# Patient Record
Sex: Female | Born: 1935 | Race: Black or African American | Hispanic: No | Smoking: Former smoker
Health system: Southern US, Community
[De-identification: ages and names within clinical notes are randomized; demographics above are authoritative.]

## PROBLEM LIST (undated history)

## (undated) DIAGNOSIS — M8580 Other specified disorders of bone density and structure, unspecified site: Secondary | ICD-10-CM

## (undated) DIAGNOSIS — F419 Anxiety disorder, unspecified: Secondary | ICD-10-CM

## (undated) DIAGNOSIS — J411 Mucopurulent chronic bronchitis: Secondary | ICD-10-CM

## (undated) DIAGNOSIS — F0150 Vascular dementia without behavioral disturbance: Secondary | ICD-10-CM

## (undated) DIAGNOSIS — C259 Malignant neoplasm of pancreas, unspecified: Secondary | ICD-10-CM

## (undated) DIAGNOSIS — M1990 Unspecified osteoarthritis, unspecified site: Secondary | ICD-10-CM

## (undated) DIAGNOSIS — J8410 Pulmonary fibrosis, unspecified: Secondary | ICD-10-CM

## (undated) DIAGNOSIS — M0600 Rheumatoid arthritis without rheumatoid factor, unspecified site: Secondary | ICD-10-CM

## (undated) DIAGNOSIS — M545 Low back pain, unspecified: Secondary | ICD-10-CM

## (undated) DIAGNOSIS — R112 Nausea with vomiting, unspecified: Secondary | ICD-10-CM

## (undated) DIAGNOSIS — Z9889 Other specified postprocedural states: Secondary | ICD-10-CM

## (undated) DIAGNOSIS — R202 Paresthesia of skin: Secondary | ICD-10-CM

## (undated) HISTORY — DX: Malignant neoplasm of pancreas, unspecified: C25.9

## (undated) HISTORY — PX: APPENDECTOMY: SHX54

## (undated) HISTORY — DX: Pulmonary fibrosis, unspecified: J84.10

## (undated) HISTORY — DX: Vascular dementia, unspecified severity, without behavioral disturbance, psychotic disturbance, mood disturbance, and anxiety: F01.50

## (undated) HISTORY — DX: Other specified disorders of bone density and structure, unspecified site: M85.80

## (undated) HISTORY — DX: Unspecified osteoarthritis, unspecified site: M19.90

## (undated) HISTORY — DX: Anxiety disorder, unspecified: F41.9

## (undated) HISTORY — DX: Paresthesia of skin: R20.2

## (undated) HISTORY — DX: Mucopurulent chronic bronchitis: J41.1

## (undated) HISTORY — DX: Rheumatoid arthritis without rheumatoid factor, unspecified site: M06.00

## (undated) HISTORY — DX: Low back pain, unspecified: M54.50

## (undated) HISTORY — DX: Low back pain: M54.5

## (undated) HISTORY — PX: ABDOMINAL HYSTERECTOMY: SHX81

---

## 1997-08-18 ENCOUNTER — Ambulatory Visit (HOSPITAL_COMMUNITY): Admission: RE | Admit: 1997-08-18 | Discharge: 1997-08-18 | Payer: Self-pay | Admitting: Pulmonary Disease

## 1998-03-01 ENCOUNTER — Encounter: Payer: Self-pay | Admitting: Emergency Medicine

## 1998-03-01 ENCOUNTER — Emergency Department (HOSPITAL_COMMUNITY): Admission: EM | Admit: 1998-03-01 | Discharge: 1998-03-01 | Payer: Self-pay | Admitting: Emergency Medicine

## 1998-03-06 ENCOUNTER — Emergency Department (HOSPITAL_COMMUNITY): Admission: EM | Admit: 1998-03-06 | Discharge: 1998-03-06 | Payer: Self-pay | Admitting: Emergency Medicine

## 1998-03-06 ENCOUNTER — Encounter: Payer: Self-pay | Admitting: Emergency Medicine

## 1998-04-27 ENCOUNTER — Encounter: Admission: RE | Admit: 1998-04-27 | Discharge: 1998-07-26 | Payer: Self-pay | Admitting: Orthopedic Surgery

## 1999-08-03 ENCOUNTER — Emergency Department (HOSPITAL_COMMUNITY): Admission: EM | Admit: 1999-08-03 | Discharge: 1999-08-03 | Payer: Self-pay

## 1999-09-15 ENCOUNTER — Encounter: Payer: Self-pay | Admitting: Emergency Medicine

## 1999-09-15 ENCOUNTER — Emergency Department (HOSPITAL_COMMUNITY): Admission: EM | Admit: 1999-09-15 | Discharge: 1999-09-15 | Payer: Self-pay | Admitting: Emergency Medicine

## 2000-04-15 ENCOUNTER — Emergency Department (HOSPITAL_COMMUNITY): Admission: EM | Admit: 2000-04-15 | Discharge: 2000-04-15 | Payer: Self-pay | Admitting: Emergency Medicine

## 2000-04-15 ENCOUNTER — Encounter: Payer: Self-pay | Admitting: Emergency Medicine

## 2000-09-26 ENCOUNTER — Emergency Department (HOSPITAL_COMMUNITY): Admission: EM | Admit: 2000-09-26 | Discharge: 2000-09-26 | Payer: Self-pay | Admitting: Emergency Medicine

## 2000-09-26 ENCOUNTER — Encounter: Payer: Self-pay | Admitting: Emergency Medicine

## 2003-06-21 ENCOUNTER — Emergency Department (HOSPITAL_COMMUNITY): Admission: EM | Admit: 2003-06-21 | Discharge: 2003-06-21 | Payer: Self-pay | Admitting: Emergency Medicine

## 2003-12-29 ENCOUNTER — Encounter: Admission: RE | Admit: 2003-12-29 | Discharge: 2003-12-29 | Payer: Self-pay | Admitting: *Deleted

## 2004-04-12 ENCOUNTER — Ambulatory Visit: Payer: Self-pay | Admitting: Pulmonary Disease

## 2004-04-22 ENCOUNTER — Encounter (HOSPITAL_COMMUNITY): Admission: RE | Admit: 2004-04-22 | Discharge: 2004-07-21 | Payer: Self-pay | Admitting: Pulmonary Disease

## 2004-05-11 ENCOUNTER — Emergency Department (HOSPITAL_COMMUNITY): Admission: EM | Admit: 2004-05-11 | Discharge: 2004-05-12 | Payer: Self-pay | Admitting: Emergency Medicine

## 2004-08-10 ENCOUNTER — Ambulatory Visit: Payer: Self-pay | Admitting: Pulmonary Disease

## 2004-08-12 ENCOUNTER — Encounter (HOSPITAL_COMMUNITY): Admission: RE | Admit: 2004-08-12 | Discharge: 2004-11-10 | Payer: Self-pay | Admitting: Pulmonary Disease

## 2004-09-25 ENCOUNTER — Encounter: Admission: RE | Admit: 2004-09-25 | Discharge: 2004-09-25 | Payer: Self-pay | Admitting: Neurology

## 2004-11-12 ENCOUNTER — Encounter (HOSPITAL_COMMUNITY): Admission: RE | Admit: 2004-11-12 | Discharge: 2005-02-10 | Payer: Self-pay | Admitting: Pulmonary Disease

## 2004-12-07 ENCOUNTER — Ambulatory Visit: Payer: Self-pay | Admitting: Pulmonary Disease

## 2005-01-05 ENCOUNTER — Ambulatory Visit: Payer: Self-pay | Admitting: Pulmonary Disease

## 2005-02-11 ENCOUNTER — Encounter (HOSPITAL_COMMUNITY): Admission: RE | Admit: 2005-02-11 | Discharge: 2005-05-11 | Payer: Self-pay | Admitting: Pulmonary Disease

## 2005-04-07 ENCOUNTER — Ambulatory Visit: Payer: Self-pay | Admitting: Pulmonary Disease

## 2005-05-12 ENCOUNTER — Encounter (HOSPITAL_COMMUNITY): Admission: RE | Admit: 2005-05-12 | Discharge: 2005-07-26 | Payer: Self-pay | Admitting: Pulmonary Disease

## 2005-06-02 ENCOUNTER — Emergency Department (HOSPITAL_COMMUNITY): Admission: EM | Admit: 2005-06-02 | Discharge: 2005-06-03 | Payer: Self-pay | Admitting: Emergency Medicine

## 2005-06-03 ENCOUNTER — Inpatient Hospital Stay (HOSPITAL_COMMUNITY): Admission: EM | Admit: 2005-06-03 | Discharge: 2005-06-07 | Payer: Self-pay | Admitting: Pulmonary Disease

## 2005-06-03 ENCOUNTER — Ambulatory Visit: Payer: Self-pay | Admitting: Internal Medicine

## 2005-06-15 ENCOUNTER — Ambulatory Visit: Payer: Self-pay | Admitting: Pulmonary Disease

## 2005-07-27 ENCOUNTER — Ambulatory Visit: Payer: Self-pay | Admitting: Pulmonary Disease

## 2005-07-28 ENCOUNTER — Encounter (HOSPITAL_COMMUNITY): Admission: RE | Admit: 2005-07-28 | Discharge: 2005-10-26 | Payer: Self-pay | Admitting: Pulmonary Disease

## 2005-09-21 ENCOUNTER — Ambulatory Visit: Payer: Self-pay | Admitting: Pulmonary Disease

## 2005-11-15 ENCOUNTER — Encounter (HOSPITAL_COMMUNITY): Admission: RE | Admit: 2005-11-15 | Discharge: 2006-02-13 | Payer: Self-pay | Admitting: Pulmonary Disease

## 2005-12-28 ENCOUNTER — Ambulatory Visit: Payer: Self-pay | Admitting: Pulmonary Disease

## 2006-04-21 ENCOUNTER — Ambulatory Visit: Payer: Self-pay | Admitting: Pulmonary Disease

## 2006-04-21 LAB — CONVERTED CEMR LAB
ALT: 13 units/L (ref 0–40)
AST: 19 units/L (ref 0–37)
Albumin: 3.5 g/dL (ref 3.5–5.2)
Alkaline Phosphatase: 67 units/L (ref 39–117)
BUN: 7 mg/dL (ref 6–23)
Basophils Absolute: 0 10*3/uL (ref 0.0–0.1)
Basophils Relative: 0.4 % (ref 0.0–1.0)
Bilirubin, Direct: 0.1 mg/dL (ref 0.0–0.3)
CO2: 30 meq/L (ref 19–32)
Calcium: 9.9 mg/dL (ref 8.4–10.5)
Chloride: 104 meq/L (ref 96–112)
Creatinine, Ser: 0.9 mg/dL (ref 0.4–1.2)
Eosinophils Absolute: 0.2 10*3/uL (ref 0.0–0.6)
Eosinophils Relative: 1.9 % (ref 0.0–5.0)
GFR calc Af Amer: 77 mL/min
GFR calc non Af Amer: 64 mL/min
Glucose, Bld: 116 mg/dL — ABNORMAL HIGH (ref 70–99)
HCT: 39.2 % (ref 36.0–46.0)
Hemoglobin: 13.4 g/dL (ref 12.0–15.0)
Lymphocytes Relative: 9.4 % — ABNORMAL LOW (ref 12.0–46.0)
MCHC: 34.2 g/dL (ref 30.0–36.0)
MCV: 90.7 fL (ref 78.0–100.0)
Monocytes Absolute: 0.3 10*3/uL (ref 0.2–0.7)
Monocytes Relative: 3.2 % (ref 3.0–11.0)
Neutro Abs: 6.7 10*3/uL (ref 1.4–7.7)
Neutrophils Relative %: 85.1 % — ABNORMAL HIGH (ref 43.0–77.0)
Platelets: 184 10*3/uL (ref 150–400)
Potassium: 3.9 meq/L (ref 3.5–5.1)
RBC: 4.32 M/uL (ref 3.87–5.11)
RDW: 13.6 % (ref 11.5–14.6)
Sed Rate: 21 mm/hr (ref 0–25)
Sodium: 144 meq/L (ref 135–145)
TSH: 1.45 microintl units/mL (ref 0.35–5.50)
Total Bilirubin: 1 mg/dL (ref 0.3–1.2)
Total Protein: 6.4 g/dL (ref 6.0–8.3)
WBC: 7.9 10*3/uL (ref 4.5–10.5)

## 2006-10-18 ENCOUNTER — Ambulatory Visit: Payer: Self-pay | Admitting: Pulmonary Disease

## 2006-12-27 ENCOUNTER — Ambulatory Visit: Payer: Self-pay | Admitting: Pulmonary Disease

## 2007-03-21 ENCOUNTER — Ambulatory Visit: Payer: Self-pay | Admitting: Pulmonary Disease

## 2007-03-21 DIAGNOSIS — J8410 Pulmonary fibrosis, unspecified: Secondary | ICD-10-CM

## 2007-03-21 DIAGNOSIS — J309 Allergic rhinitis, unspecified: Secondary | ICD-10-CM | POA: Insufficient documentation

## 2007-03-21 DIAGNOSIS — M1990 Unspecified osteoarthritis, unspecified site: Secondary | ICD-10-CM | POA: Insufficient documentation

## 2007-03-21 DIAGNOSIS — F0150 Vascular dementia without behavioral disturbance: Secondary | ICD-10-CM

## 2007-03-21 DIAGNOSIS — M545 Low back pain: Secondary | ICD-10-CM

## 2007-03-23 ENCOUNTER — Telehealth: Payer: Self-pay | Admitting: Pulmonary Disease

## 2007-03-28 DIAGNOSIS — F411 Generalized anxiety disorder: Secondary | ICD-10-CM | POA: Insufficient documentation

## 2007-03-28 DIAGNOSIS — M899 Disorder of bone, unspecified: Secondary | ICD-10-CM | POA: Insufficient documentation

## 2007-03-28 DIAGNOSIS — R209 Unspecified disturbances of skin sensation: Secondary | ICD-10-CM

## 2007-03-28 DIAGNOSIS — M949 Disorder of cartilage, unspecified: Secondary | ICD-10-CM

## 2007-03-30 ENCOUNTER — Telehealth (INDEPENDENT_AMBULATORY_CARE_PROVIDER_SITE_OTHER): Payer: Self-pay | Admitting: *Deleted

## 2007-04-24 ENCOUNTER — Ambulatory Visit: Payer: Self-pay | Admitting: Pulmonary Disease

## 2007-05-04 ENCOUNTER — Telehealth (INDEPENDENT_AMBULATORY_CARE_PROVIDER_SITE_OTHER): Payer: Self-pay | Admitting: *Deleted

## 2007-05-09 ENCOUNTER — Telehealth: Payer: Self-pay | Admitting: Pulmonary Disease

## 2007-05-16 ENCOUNTER — Telehealth (INDEPENDENT_AMBULATORY_CARE_PROVIDER_SITE_OTHER): Payer: Self-pay | Admitting: *Deleted

## 2007-06-14 ENCOUNTER — Encounter: Payer: Self-pay | Admitting: Pulmonary Disease

## 2007-06-27 ENCOUNTER — Telehealth (INDEPENDENT_AMBULATORY_CARE_PROVIDER_SITE_OTHER): Payer: Self-pay | Admitting: *Deleted

## 2007-07-25 ENCOUNTER — Telehealth: Payer: Self-pay | Admitting: Pulmonary Disease

## 2007-07-26 ENCOUNTER — Emergency Department (HOSPITAL_COMMUNITY): Admission: EM | Admit: 2007-07-26 | Discharge: 2007-07-26 | Payer: Self-pay | Admitting: Emergency Medicine

## 2007-07-27 ENCOUNTER — Telehealth: Payer: Self-pay | Admitting: Pulmonary Disease

## 2007-07-27 ENCOUNTER — Encounter: Payer: Self-pay | Admitting: Pulmonary Disease

## 2007-08-03 ENCOUNTER — Telehealth: Payer: Self-pay | Admitting: Pulmonary Disease

## 2007-09-11 ENCOUNTER — Emergency Department (HOSPITAL_COMMUNITY): Admission: EM | Admit: 2007-09-11 | Discharge: 2007-09-11 | Payer: Self-pay | Admitting: Emergency Medicine

## 2007-09-18 ENCOUNTER — Ambulatory Visit: Payer: Self-pay | Admitting: Pulmonary Disease

## 2007-09-18 DIAGNOSIS — J42 Unspecified chronic bronchitis: Secondary | ICD-10-CM

## 2007-09-20 ENCOUNTER — Encounter: Admission: RE | Admit: 2007-09-20 | Discharge: 2007-12-05 | Payer: Self-pay | Admitting: Pulmonary Disease

## 2007-09-28 ENCOUNTER — Encounter: Payer: Self-pay | Admitting: Pulmonary Disease

## 2007-10-05 ENCOUNTER — Telehealth (INDEPENDENT_AMBULATORY_CARE_PROVIDER_SITE_OTHER): Payer: Self-pay | Admitting: *Deleted

## 2007-10-17 ENCOUNTER — Telehealth: Payer: Self-pay | Admitting: Pulmonary Disease

## 2007-10-19 ENCOUNTER — Telehealth (INDEPENDENT_AMBULATORY_CARE_PROVIDER_SITE_OTHER): Payer: Self-pay | Admitting: *Deleted

## 2007-10-19 ENCOUNTER — Encounter: Payer: Self-pay | Admitting: Pulmonary Disease

## 2007-11-15 ENCOUNTER — Encounter: Payer: Self-pay | Admitting: Pulmonary Disease

## 2008-01-02 ENCOUNTER — Ambulatory Visit: Payer: Self-pay | Admitting: Pulmonary Disease

## 2008-01-22 ENCOUNTER — Ambulatory Visit: Payer: Self-pay | Admitting: Pulmonary Disease

## 2008-01-22 ENCOUNTER — Ambulatory Visit: Payer: Self-pay | Admitting: Internal Medicine

## 2008-01-27 LAB — CONVERTED CEMR LAB
ALT: 23 units/L (ref 0–35)
Alkaline Phosphatase: 88 units/L (ref 39–117)
Basophils Absolute: 0 10*3/uL (ref 0.0–0.1)
Bilirubin, Direct: 0.1 mg/dL (ref 0.0–0.3)
CO2: 28 meq/L (ref 19–32)
Calcium: 9.7 mg/dL (ref 8.4–10.5)
GFR calc Af Amer: 77 mL/min
Hemoglobin: 12.8 g/dL (ref 12.0–15.0)
Lymphocytes Relative: 17.8 % (ref 12.0–46.0)
MCHC: 34.3 g/dL (ref 30.0–36.0)
Neutro Abs: 4 10*3/uL (ref 1.4–7.7)
Platelets: 162 10*3/uL (ref 150–400)
Potassium: 4.2 meq/L (ref 3.5–5.1)
Pro B Natriuretic peptide (BNP): 88 pg/mL (ref 0.0–100.0)
RDW: 13.3 % (ref 11.5–14.6)
Saturation Ratios: 31 % (ref 20.0–50.0)
Sodium: 141 meq/L (ref 135–145)
TSH: 2.06 microintl units/mL (ref 0.35–5.50)
Total Bilirubin: 0.6 mg/dL (ref 0.3–1.2)
Transferrin: 168.3 mg/dL — ABNORMAL LOW (ref >=212.0)

## 2008-01-29 ENCOUNTER — Telehealth: Payer: Self-pay | Admitting: Pulmonary Disease

## 2008-01-31 ENCOUNTER — Telehealth (INDEPENDENT_AMBULATORY_CARE_PROVIDER_SITE_OTHER): Payer: Self-pay | Admitting: *Deleted

## 2008-02-13 ENCOUNTER — Telehealth (INDEPENDENT_AMBULATORY_CARE_PROVIDER_SITE_OTHER): Payer: Self-pay | Admitting: *Deleted

## 2008-02-22 ENCOUNTER — Telehealth: Payer: Self-pay | Admitting: Pulmonary Disease

## 2008-02-29 ENCOUNTER — Encounter: Payer: Self-pay | Admitting: Pulmonary Disease

## 2008-02-29 ENCOUNTER — Telehealth: Payer: Self-pay | Admitting: Pulmonary Disease

## 2008-03-17 ENCOUNTER — Encounter: Payer: Self-pay | Admitting: Pulmonary Disease

## 2008-04-30 ENCOUNTER — Telehealth (INDEPENDENT_AMBULATORY_CARE_PROVIDER_SITE_OTHER): Payer: Self-pay | Admitting: *Deleted

## 2008-04-30 ENCOUNTER — Encounter: Payer: Self-pay | Admitting: Pulmonary Disease

## 2008-05-01 ENCOUNTER — Telehealth: Payer: Self-pay | Admitting: Pulmonary Disease

## 2008-05-15 ENCOUNTER — Encounter: Payer: Self-pay | Admitting: Pulmonary Disease

## 2008-05-20 ENCOUNTER — Telehealth (INDEPENDENT_AMBULATORY_CARE_PROVIDER_SITE_OTHER): Payer: Self-pay | Admitting: *Deleted

## 2008-05-20 ENCOUNTER — Encounter: Payer: Self-pay | Admitting: Pulmonary Disease

## 2008-05-22 ENCOUNTER — Ambulatory Visit: Payer: Self-pay | Admitting: Pulmonary Disease

## 2008-05-22 DIAGNOSIS — M069 Rheumatoid arthritis, unspecified: Secondary | ICD-10-CM | POA: Insufficient documentation

## 2008-06-04 ENCOUNTER — Telehealth (INDEPENDENT_AMBULATORY_CARE_PROVIDER_SITE_OTHER): Payer: Self-pay | Admitting: *Deleted

## 2008-06-12 ENCOUNTER — Telehealth (INDEPENDENT_AMBULATORY_CARE_PROVIDER_SITE_OTHER): Payer: Self-pay | Admitting: *Deleted

## 2008-06-18 ENCOUNTER — Encounter: Payer: Self-pay | Admitting: Pulmonary Disease

## 2008-07-07 ENCOUNTER — Telehealth (INDEPENDENT_AMBULATORY_CARE_PROVIDER_SITE_OTHER): Payer: Self-pay | Admitting: *Deleted

## 2008-07-09 ENCOUNTER — Telehealth (INDEPENDENT_AMBULATORY_CARE_PROVIDER_SITE_OTHER): Payer: Self-pay | Admitting: *Deleted

## 2008-07-11 ENCOUNTER — Telehealth: Payer: Self-pay | Admitting: Pulmonary Disease

## 2008-07-14 ENCOUNTER — Emergency Department (HOSPITAL_COMMUNITY): Admission: EM | Admit: 2008-07-14 | Discharge: 2008-07-14 | Payer: Self-pay | Admitting: Emergency Medicine

## 2008-07-14 ENCOUNTER — Telehealth (INDEPENDENT_AMBULATORY_CARE_PROVIDER_SITE_OTHER): Payer: Self-pay | Admitting: *Deleted

## 2008-07-15 ENCOUNTER — Telehealth (INDEPENDENT_AMBULATORY_CARE_PROVIDER_SITE_OTHER): Payer: Self-pay | Admitting: *Deleted

## 2008-07-17 ENCOUNTER — Ambulatory Visit: Payer: Self-pay | Admitting: Pulmonary Disease

## 2008-07-23 ENCOUNTER — Encounter: Payer: Self-pay | Admitting: Pulmonary Disease

## 2008-07-28 ENCOUNTER — Encounter: Payer: Self-pay | Admitting: Pulmonary Disease

## 2008-08-01 ENCOUNTER — Ambulatory Visit: Payer: Self-pay | Admitting: Pulmonary Disease

## 2008-08-05 ENCOUNTER — Telehealth (INDEPENDENT_AMBULATORY_CARE_PROVIDER_SITE_OTHER): Payer: Self-pay | Admitting: *Deleted

## 2008-08-06 ENCOUNTER — Encounter: Payer: Self-pay | Admitting: Pulmonary Disease

## 2008-08-06 ENCOUNTER — Telehealth (INDEPENDENT_AMBULATORY_CARE_PROVIDER_SITE_OTHER): Payer: Self-pay | Admitting: *Deleted

## 2008-08-07 ENCOUNTER — Telehealth: Payer: Self-pay | Admitting: Pulmonary Disease

## 2008-08-10 LAB — CONVERTED CEMR LAB
ALT: 24 units/L (ref 0–35)
AST: 24 units/L (ref 0–37)
BUN: 14 mg/dL (ref 6–23)
Bilirubin, Direct: 0.2 mg/dL (ref 0.0–0.3)
Calcium: 9 mg/dL (ref 8.4–10.5)
Creatinine, Ser: 0.9 mg/dL (ref 0.4–1.2)
Eosinophils Relative: 0.9 % (ref 0.0–5.0)
GFR calc non Af Amer: 76.98 mL/min (ref >60)
Lymphocytes Relative: 11.4 % — ABNORMAL LOW (ref 12.0–46.0)
Monocytes Absolute: 0.5 10*3/uL (ref 0.1–1.0)
Monocytes Relative: 6.5 % (ref 3.0–12.0)
Neutrophils Relative %: 81 % — ABNORMAL HIGH (ref 43.0–77.0)
Platelets: 153 10*3/uL (ref 150.0–400.0)
Total Bilirubin: 0.6 mg/dL (ref 0.3–1.2)
Uric Acid, Serum: 4.3 mg/dL (ref 2.4–7.0)
WBC: 7.3 10*3/uL (ref 4.5–10.5)

## 2008-08-14 ENCOUNTER — Encounter: Payer: Self-pay | Admitting: Pulmonary Disease

## 2008-08-28 ENCOUNTER — Encounter: Payer: Self-pay | Admitting: Pulmonary Disease

## 2008-09-02 ENCOUNTER — Ambulatory Visit: Payer: Self-pay | Admitting: Pulmonary Disease

## 2008-09-04 ENCOUNTER — Telehealth (INDEPENDENT_AMBULATORY_CARE_PROVIDER_SITE_OTHER): Payer: Self-pay | Admitting: *Deleted

## 2008-09-08 ENCOUNTER — Ambulatory Visit: Payer: Self-pay | Admitting: Hematology and Oncology

## 2008-09-11 ENCOUNTER — Encounter: Payer: Self-pay | Admitting: Pulmonary Disease

## 2008-09-11 LAB — CBC WITH DIFFERENTIAL/PLATELET
BASO%: 0.1 % (ref 0.0–2.0)
EOS%: 0 % (ref 0.0–7.0)
MCH: 32.7 pg (ref 25.1–34.0)
MCV: 95.9 fL (ref 79.5–101.0)
MONO%: 2.8 % (ref 0.0–14.0)
RBC: 4.07 10*6/uL (ref 3.70–5.45)
RDW: 15.8 % — ABNORMAL HIGH (ref 11.2–14.5)

## 2008-09-11 LAB — MORPHOLOGY

## 2008-09-16 ENCOUNTER — Ambulatory Visit (HOSPITAL_COMMUNITY): Admission: RE | Admit: 2008-09-16 | Discharge: 2008-09-16 | Payer: Self-pay | Admitting: Hematology and Oncology

## 2008-09-16 LAB — COMPREHENSIVE METABOLIC PANEL
ALT: 17 U/L (ref 0–35)
BUN: 12 mg/dL (ref 6–23)
CO2: 28 mEq/L (ref 19–32)
Calcium: 9.5 mg/dL (ref 8.4–10.5)
Chloride: 105 mEq/L (ref 96–112)
Creatinine, Ser: 0.85 mg/dL (ref 0.40–1.20)

## 2008-09-16 LAB — KAPPA/LAMBDA LIGHT CHAINS: Lambda Free Lght Chn: 0.83 mg/dL (ref 0.57–2.63)

## 2008-09-16 LAB — SPEP & IFE WITH QIG
Alpha-1-Globulin: 5.2 % — ABNORMAL HIGH (ref 2.9–4.9)
Alpha-2-Globulin: 10.4 % (ref 7.1–11.8)
Gamma Globulin: 9.2 % — ABNORMAL LOW (ref 11.1–18.8)

## 2008-09-16 LAB — LACTATE DEHYDROGENASE: LDH: 296 U/L — ABNORMAL HIGH (ref 94–250)

## 2008-09-19 ENCOUNTER — Ambulatory Visit: Payer: Self-pay | Admitting: Pulmonary Disease

## 2008-09-19 ENCOUNTER — Telehealth: Payer: Self-pay | Admitting: Pulmonary Disease

## 2008-09-26 ENCOUNTER — Encounter (INDEPENDENT_AMBULATORY_CARE_PROVIDER_SITE_OTHER): Payer: Self-pay | Admitting: Diagnostic Radiology

## 2008-09-26 ENCOUNTER — Ambulatory Visit (HOSPITAL_COMMUNITY): Admission: RE | Admit: 2008-09-26 | Discharge: 2008-09-26 | Payer: Self-pay | Admitting: Hematology and Oncology

## 2008-10-03 ENCOUNTER — Encounter: Payer: Self-pay | Admitting: Pulmonary Disease

## 2008-10-08 ENCOUNTER — Telehealth: Payer: Self-pay | Admitting: Pulmonary Disease

## 2008-10-09 ENCOUNTER — Encounter: Payer: Self-pay | Admitting: Pulmonary Disease

## 2008-11-06 ENCOUNTER — Telehealth (INDEPENDENT_AMBULATORY_CARE_PROVIDER_SITE_OTHER): Payer: Self-pay | Admitting: *Deleted

## 2008-11-07 ENCOUNTER — Encounter: Payer: Self-pay | Admitting: Pulmonary Disease

## 2008-12-10 ENCOUNTER — Ambulatory Visit: Payer: Self-pay | Admitting: Pulmonary Disease

## 2009-01-30 ENCOUNTER — Telehealth: Payer: Self-pay | Admitting: Pulmonary Disease

## 2009-02-03 ENCOUNTER — Ambulatory Visit: Payer: Self-pay | Admitting: Hematology and Oncology

## 2009-02-03 ENCOUNTER — Telehealth (INDEPENDENT_AMBULATORY_CARE_PROVIDER_SITE_OTHER): Payer: Self-pay | Admitting: *Deleted

## 2009-02-09 ENCOUNTER — Encounter: Payer: Self-pay | Admitting: Pulmonary Disease

## 2009-02-09 LAB — CBC WITH DIFFERENTIAL/PLATELET
Basophils Absolute: 0 10*3/uL (ref 0.0–0.1)
Eosinophils Absolute: 0.1 10*3/uL (ref 0.0–0.5)
HCT: 39.7 % (ref 34.8–46.6)
HGB: 13.1 g/dL (ref 11.6–15.9)
LYMPH%: 9.3 % — ABNORMAL LOW (ref 14.0–49.7)
MCV: 99.1 fL (ref 79.5–101.0)
MONO#: 0.7 10*3/uL (ref 0.1–0.9)
NEUT#: 7.6 10*3/uL — ABNORMAL HIGH (ref 1.5–6.5)
NEUT%: 81.8 % — ABNORMAL HIGH (ref 38.4–76.8)
Platelets: 201 10*3/uL (ref 145–400)
RBC: 4 10*6/uL (ref 3.70–5.45)
WBC: 9.3 10*3/uL (ref 3.9–10.3)

## 2009-02-10 ENCOUNTER — Encounter: Payer: Self-pay | Admitting: Pulmonary Disease

## 2009-02-11 LAB — COMPREHENSIVE METABOLIC PANEL
BUN: 19 mg/dL (ref 6–23)
CO2: 29 mEq/L (ref 19–32)
Glucose, Bld: 96 mg/dL (ref 70–99)
Sodium: 142 mEq/L (ref 135–145)
Total Bilirubin: 0.4 mg/dL (ref 0.3–1.2)
Total Protein: 6.3 g/dL (ref 6.0–8.3)

## 2009-02-11 LAB — IMMUNOFIXATION ELECTROPHORESIS
IgA: 385 mg/dL — ABNORMAL HIGH (ref 68–378)
IgG (Immunoglobin G), Serum: 676 mg/dL — ABNORMAL LOW (ref 694–1618)
Total Protein, Serum Electrophoresis: 6.2 g/dL (ref 6.0–8.3)

## 2009-02-20 ENCOUNTER — Encounter: Payer: Self-pay | Admitting: Pulmonary Disease

## 2009-03-02 ENCOUNTER — Telehealth (INDEPENDENT_AMBULATORY_CARE_PROVIDER_SITE_OTHER): Payer: Self-pay | Admitting: *Deleted

## 2009-04-03 ENCOUNTER — Encounter: Payer: Self-pay | Admitting: Pulmonary Disease

## 2009-04-07 ENCOUNTER — Ambulatory Visit: Payer: Self-pay | Admitting: Pulmonary Disease

## 2009-04-07 DIAGNOSIS — C9000 Multiple myeloma not having achieved remission: Secondary | ICD-10-CM | POA: Insufficient documentation

## 2009-04-17 ENCOUNTER — Telehealth: Payer: Self-pay | Admitting: Pulmonary Disease

## 2009-04-28 ENCOUNTER — Telehealth (INDEPENDENT_AMBULATORY_CARE_PROVIDER_SITE_OTHER): Payer: Self-pay | Admitting: *Deleted

## 2009-06-29 ENCOUNTER — Telehealth (INDEPENDENT_AMBULATORY_CARE_PROVIDER_SITE_OTHER): Payer: Self-pay | Admitting: *Deleted

## 2009-07-16 ENCOUNTER — Telehealth (INDEPENDENT_AMBULATORY_CARE_PROVIDER_SITE_OTHER): Payer: Self-pay | Admitting: *Deleted

## 2009-07-17 ENCOUNTER — Ambulatory Visit: Payer: Self-pay | Admitting: Hematology and Oncology

## 2009-07-20 LAB — CBC WITH DIFFERENTIAL/PLATELET
BASO%: 0.3 % (ref 0.0–2.0)
Eosinophils Absolute: 0.1 10*3/uL (ref 0.0–0.5)
LYMPH%: 21.1 % (ref 14.0–49.7)
MCHC: 32.9 g/dL (ref 31.5–36.0)
MONO#: 0.5 10*3/uL (ref 0.1–0.9)
NEUT#: 3.4 10*3/uL (ref 1.5–6.5)
Platelets: 199 10*3/uL (ref 145–400)
RBC: 3.94 10*6/uL (ref 3.70–5.45)
WBC: 5.2 10*3/uL (ref 3.9–10.3)
lymph#: 1.1 10*3/uL (ref 0.9–3.3)

## 2009-07-22 ENCOUNTER — Encounter: Payer: Self-pay | Admitting: Pulmonary Disease

## 2009-07-22 LAB — COMPREHENSIVE METABOLIC PANEL
ALT: 11 U/L (ref 0–35)
Albumin: 3.9 g/dL (ref 3.5–5.2)
CO2: 24 mEq/L (ref 19–32)
Calcium: 9.4 mg/dL (ref 8.4–10.5)
Chloride: 105 mEq/L (ref 96–112)
Glucose, Bld: 88 mg/dL (ref 70–99)
Potassium: 3.9 mEq/L (ref 3.5–5.3)
Sodium: 140 mEq/L (ref 135–145)
Total Protein: 6.4 g/dL (ref 6.0–8.3)

## 2009-07-22 LAB — SPEP & IFE WITH QIG
Albumin ELP: 60 % (ref 55.8–66.1)
Alpha-1-Globulin: 5 % — ABNORMAL HIGH (ref 2.9–4.9)
Alpha-2-Globulin: 9.8 % (ref 7.1–11.8)
Beta 2: 11 % — ABNORMAL HIGH (ref 3.2–6.5)
Beta Globulin: 5.3 % (ref 4.7–7.2)
IgA: 485 mg/dL — ABNORMAL HIGH (ref 68–378)
Total Protein, Serum Electrophoresis: 6.4 g/dL (ref 6.0–8.3)

## 2009-07-22 LAB — KAPPA/LAMBDA LIGHT CHAINS
Kappa free light chain: <0.54 mg/dL (ref 0.33–1.94)
Lambda Free Lght Chn: 0.58 mg/dL (ref 0.57–2.63)

## 2009-08-18 ENCOUNTER — Encounter: Payer: Self-pay | Admitting: Pulmonary Disease

## 2009-08-18 ENCOUNTER — Ambulatory Visit: Payer: Self-pay | Admitting: Hematology and Oncology

## 2009-09-17 ENCOUNTER — Ambulatory Visit: Payer: Self-pay | Admitting: Pulmonary Disease

## 2009-10-22 ENCOUNTER — Encounter: Payer: Self-pay | Admitting: Pulmonary Disease

## 2009-12-03 ENCOUNTER — Telehealth (INDEPENDENT_AMBULATORY_CARE_PROVIDER_SITE_OTHER): Payer: Self-pay | Admitting: *Deleted

## 2009-12-04 ENCOUNTER — Ambulatory Visit: Payer: Self-pay | Admitting: Pulmonary Disease

## 2009-12-04 LAB — CONVERTED CEMR LAB
AST: 24 units/L (ref 0–37)
Albumin: 3.9 g/dL (ref 3.5–5.2)
Alkaline Phosphatase: 73 units/L (ref 39–117)
Basophils Absolute: 0 10*3/uL (ref 0.0–0.1)
Eosinophils Absolute: 0 10*3/uL (ref 0.0–0.7)
GFR calc non Af Amer: 75.75 mL/min (ref >60)
Glucose, Bld: 102 mg/dL — ABNORMAL HIGH (ref 70–99)
HCT: 39.3 % (ref 36.0–46.0)
Lymphs Abs: 0.7 10*3/uL (ref 0.7–4.0)
MCHC: 33.4 g/dL (ref 30.0–36.0)
MCV: 97.6 fL (ref 78.0–100.0)
Monocytes Absolute: 0.3 10*3/uL (ref 0.1–1.0)
Monocytes Relative: 4.5 % (ref 3.0–12.0)
Platelets: 187 10*3/uL (ref 150.0–400.0)
Potassium: 4.8 meq/L (ref 3.5–5.1)
RDW: 14.8 % — ABNORMAL HIGH (ref 11.5–14.6)
Sodium: 141 meq/L (ref 135–145)
TSH: 1.37 microintl units/mL (ref 0.35–5.50)

## 2009-12-05 LAB — CONVERTED CEMR LAB: Vit D, 25-Hydroxy: 67 ng/mL (ref 30–89)

## 2009-12-07 ENCOUNTER — Telehealth: Payer: Self-pay | Admitting: Pulmonary Disease

## 2009-12-07 ENCOUNTER — Telehealth (INDEPENDENT_AMBULATORY_CARE_PROVIDER_SITE_OTHER): Payer: Self-pay | Admitting: *Deleted

## 2010-02-01 ENCOUNTER — Telehealth: Payer: Self-pay | Admitting: Pulmonary Disease

## 2010-02-09 ENCOUNTER — Inpatient Hospital Stay (HOSPITAL_COMMUNITY)
Admission: EM | Admit: 2010-02-09 | Discharge: 2010-02-22 | Payer: Self-pay | Source: Home / Self Care | Attending: Internal Medicine | Admitting: Internal Medicine

## 2010-02-09 ENCOUNTER — Encounter (INDEPENDENT_AMBULATORY_CARE_PROVIDER_SITE_OTHER): Payer: Self-pay | Admitting: Internal Medicine

## 2010-02-09 ENCOUNTER — Encounter: Payer: Self-pay | Admitting: Pulmonary Disease

## 2010-02-09 ENCOUNTER — Telehealth (INDEPENDENT_AMBULATORY_CARE_PROVIDER_SITE_OTHER): Payer: Self-pay | Admitting: *Deleted

## 2010-02-10 ENCOUNTER — Telehealth: Payer: Self-pay | Admitting: Pulmonary Disease

## 2010-02-11 ENCOUNTER — Telehealth: Payer: Self-pay | Admitting: Pulmonary Disease

## 2010-02-11 HISTORY — PX: OTHER SURGICAL HISTORY: SHX169

## 2010-02-17 ENCOUNTER — Ambulatory Visit: Payer: Self-pay | Admitting: Hematology and Oncology

## 2010-02-18 ENCOUNTER — Telehealth: Payer: Self-pay | Admitting: Pulmonary Disease

## 2010-02-26 ENCOUNTER — Telehealth (INDEPENDENT_AMBULATORY_CARE_PROVIDER_SITE_OTHER): Payer: Self-pay | Admitting: *Deleted

## 2010-03-01 ENCOUNTER — Telehealth (INDEPENDENT_AMBULATORY_CARE_PROVIDER_SITE_OTHER): Payer: Self-pay | Admitting: *Deleted

## 2010-03-04 ENCOUNTER — Telehealth: Payer: Self-pay | Admitting: Pulmonary Disease

## 2010-03-05 ENCOUNTER — Ambulatory Visit: Payer: Self-pay | Admitting: Pulmonary Disease

## 2010-03-05 DIAGNOSIS — C259 Malignant neoplasm of pancreas, unspecified: Secondary | ICD-10-CM | POA: Insufficient documentation

## 2010-03-10 ENCOUNTER — Ambulatory Visit: Payer: Self-pay | Admitting: Pulmonary Disease

## 2010-03-18 ENCOUNTER — Encounter: Payer: Self-pay | Admitting: Pulmonary Disease

## 2010-03-18 LAB — COMPREHENSIVE METABOLIC PANEL
ALT: 35 U/L (ref 0–35)
AST: 27 U/L (ref 0–37)
Albumin: 3.5 g/dL (ref 3.5–5.2)
Alkaline Phosphatase: 120 U/L — ABNORMAL HIGH (ref 39–117)
BUN: 15 mg/dL (ref 6–23)
CO2: 22 mEq/L (ref 19–32)
Calcium: 9.2 mg/dL (ref 8.4–10.5)
Chloride: 109 mEq/L (ref 96–112)
Creatinine, Ser: 0.75 mg/dL (ref 0.40–1.20)
Glucose, Bld: 130 mg/dL — ABNORMAL HIGH (ref 70–99)
Potassium: 4 mEq/L (ref 3.5–5.3)
Sodium: 140 mEq/L (ref 135–145)
Total Bilirubin: 1.4 mg/dL — ABNORMAL HIGH (ref 0.3–1.2)
Total Protein: 6.3 g/dL (ref 6.0–8.3)

## 2010-03-18 LAB — CBC WITH DIFFERENTIAL/PLATELET
BASO%: 0.3 % (ref 0.0–2.0)
Basophils Absolute: 0 10*3/uL (ref 0.0–0.1)
EOS%: 2.7 % (ref 0.0–7.0)
Eosinophils Absolute: 0.2 10*3/uL (ref 0.0–0.5)
HCT: 36.5 % (ref 34.8–46.6)
HGB: 12.1 g/dL (ref 11.6–15.9)
LYMPH%: 15.6 % (ref 14.0–49.7)
MCH: 30 pg (ref 25.1–34.0)
MCHC: 33.2 g/dL (ref 31.5–36.0)
MCV: 90.6 fL (ref 79.5–101.0)
MONO#: 0.5 10*3/uL (ref 0.1–0.9)
MONO%: 7.6 % (ref 0.0–14.0)
NEUT#: 4.9 10*3/uL (ref 1.5–6.5)
NEUT%: 73.8 % (ref 38.4–76.8)
Platelets: 214 10*3/uL (ref 145–400)
RBC: 4.03 10*6/uL (ref 3.70–5.45)
RDW: 14.9 % — ABNORMAL HIGH (ref 11.2–14.5)
WBC: 6.7 10*3/uL (ref 3.9–10.3)
lymph#: 1 10*3/uL (ref 0.9–3.3)
nRBC: 0 % (ref 0–0)

## 2010-03-18 LAB — CEA: CEA: 3.4 ng/mL (ref 0.0–5.0)

## 2010-03-19 ENCOUNTER — Ambulatory Visit: Payer: Self-pay | Admitting: Hematology and Oncology

## 2010-03-19 ENCOUNTER — Ambulatory Visit (HOSPITAL_COMMUNITY)
Admission: RE | Admit: 2010-03-19 | Discharge: 2010-03-19 | Payer: Self-pay | Source: Home / Self Care | Attending: Hematology and Oncology | Admitting: Hematology and Oncology

## 2010-03-22 ENCOUNTER — Telehealth: Payer: Self-pay | Admitting: Pulmonary Disease

## 2010-03-26 LAB — SPEP & IFE WITH QIG
Albumin ELP: 50.9 % — ABNORMAL LOW (ref 55.8–66.1)
Alpha-1-Globulin: 6 % — ABNORMAL HIGH (ref 2.9–4.9)
Alpha-2-Globulin: 12.3 % — ABNORMAL HIGH (ref 7.1–11.8)
Beta 2: 14.8 % — ABNORMAL HIGH (ref 3.2–6.5)
Beta Globulin: 6.8 % (ref 4.7–7.2)
Gamma Globulin: 9.2 % — ABNORMAL LOW (ref 11.1–18.8)
IgA: 448 mg/dL — ABNORMAL HIGH (ref 68–378)
IgG (Immunoglobin G), Serum: 882 mg/dL (ref 694–1618)
IgM, Serum: 34 mg/dL — ABNORMAL LOW (ref 60–263)
M-Spike, %: 0.52 g/dL
Total Protein, Serum Electrophoresis: 6.1 g/dL (ref 6.0–8.3)

## 2010-03-31 ENCOUNTER — Encounter: Payer: Self-pay | Admitting: Pulmonary Disease

## 2010-03-31 LAB — CBC WITH DIFFERENTIAL/PLATELET
BASO%: 0.8 % (ref 0.0–2.0)
Basophils Absolute: 0 10*3/uL (ref 0.0–0.1)
EOS%: 1.6 % (ref 0.0–7.0)
Eosinophils Absolute: 0.1 10*3/uL (ref 0.0–0.5)
HCT: 35 % (ref 34.8–46.6)
HGB: 11.7 g/dL (ref 11.6–15.9)
LYMPH%: 29.2 % (ref 14.0–49.7)
MCH: 29.8 pg (ref 25.1–34.0)
MCHC: 33.4 g/dL (ref 31.5–36.0)
MCV: 89.3 fL (ref 79.5–101.0)
MONO#: 0.4 10*3/uL (ref 0.1–0.9)
MONO%: 7.5 % (ref 0.0–14.0)
NEUT#: 3.1 10*3/uL (ref 1.5–6.5)
NEUT%: 60.9 % (ref 38.4–76.8)
Platelets: 171 10*3/uL (ref 145–400)
RBC: 3.92 10*6/uL (ref 3.70–5.45)
RDW: 13.6 % (ref 11.2–14.5)
WBC: 5.1 10*3/uL (ref 3.9–10.3)
lymph#: 1.5 10*3/uL (ref 0.9–3.3)

## 2010-03-31 LAB — COMPREHENSIVE METABOLIC PANEL
ALT: 48 U/L — ABNORMAL HIGH (ref 0–35)
AST: 43 U/L — ABNORMAL HIGH (ref 0–37)
Albumin: 3.5 g/dL (ref 3.5–5.2)
Alkaline Phosphatase: 115 U/L (ref 39–117)
BUN: 16 mg/dL (ref 6–23)
CO2: 19 mEq/L (ref 19–32)
Calcium: 9 mg/dL (ref 8.4–10.5)
Chloride: 105 mEq/L (ref 96–112)
Creatinine, Ser: 0.84 mg/dL (ref 0.40–1.20)
Glucose, Bld: 168 mg/dL — ABNORMAL HIGH (ref 70–99)
Potassium: 3.9 mEq/L (ref 3.5–5.3)
Sodium: 139 mEq/L (ref 135–145)
Total Bilirubin: 0.8 mg/dL (ref 0.3–1.2)
Total Protein: 6.4 g/dL (ref 6.0–8.3)

## 2010-04-07 LAB — CBC WITH DIFFERENTIAL/PLATELET
BASO%: 0.5 % (ref 0.0–2.0)
Basophils Absolute: 0 10*3/uL (ref 0.0–0.1)
EOS%: 0.7 % (ref 0.0–7.0)
HCT: 33 % — ABNORMAL LOW (ref 34.8–46.6)
HGB: 11.3 g/dL — ABNORMAL LOW (ref 11.6–15.9)
MCH: 30.4 pg (ref 25.1–34.0)
MCHC: 34.2 g/dL (ref 31.5–36.0)
MONO#: 0.3 10*3/uL (ref 0.1–0.9)
NEUT%: 60.9 % (ref 38.4–76.8)
RDW: 13.5 % (ref 11.2–14.5)
WBC: 4 10*3/uL (ref 3.9–10.3)
lymph#: 1.3 10*3/uL (ref 0.9–3.3)

## 2010-04-15 NOTE — Miscellaneous (Signed)
Summary: Plan of Care / [facility]  Plan of Care / [facility]   Imported By: Lennie Odor 10/27/2009 12:14:00  _____________________________________________________________________  External Attachment:    Type:   Image     Comment:   External Document

## 2010-04-15 NOTE — Miscellaneous (Signed)
Summary: Plan of Care & treatment/[facility]  Plan of Care & treatment/[facility]   Imported By: Sherian Rein 07/27/2009 13:11:09  _____________________________________________________________________  External Attachment:    Type:   Image     Comment:   External Document

## 2010-04-15 NOTE — Consult Note (Signed)
Summary: [place]  [place]   Imported By: Sherian Rein 02/11/2010 13:00:53  _____________________________________________________________________  External Attachment:    Type:   Image     Comment:   External Document

## 2010-04-15 NOTE — Letter (Signed)
Summary: [facility]  [facility]   Imported By: Lanelle Bal 03/19/2009 13:15:07  _____________________________________________________________________  External Attachment:    Type:   Image     Comment:   External Document

## 2010-04-15 NOTE — Assessment & Plan Note (Signed)
Summary: 6 months/apc   CC:  6 month ROV & review of mult medical problems....  History of Present Illness: 77 y/o BF here for a follow up visit... she has multiple medical problems as noted below... Followed for general medical purposes w/ hx of seroneg RA & underlying pulm fibrosis, DJD & LBP, Osteopenia, mild dementia, and Monoclonal Gammopathy/ smoldering myeloma per DrOdogwu... also followed by [place] Pellant for Rheum, and DrLucey for Ortho.   ~  September 19, 2008:  here w/ a friend who drove today, daughter in [place], visiting nurse from [place] comes once every 2 weeks and fills pill cases for her to take & hopefully monitoring for proper usage... reviewed meds (she brought bottles today)- the MTX is empty & due for refill but she is clueless- I called pharmacy... the situation is suboptimal but the best family can arrange... caution: she's taking Pred 5mg - 3/d, and MTX 2.5mg - 6tabs once per week... c/o some knee pain, but overall is improved medically.   ~  April 07, 2009:  58mo ROV & here by herself today- apparently visiting nurses still fill her pill cases for her & monitor compliance... she saw DrAnderson 11/10- tapered to 5mg  Pred & continued on 6 MTX weekly... 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 decision made for observational protocol.   ~  September 17, 2009:  she had f/u DrOdogwu 6/11- stable myeloma markers w/ Q74mo observation recommended... breathing is OK- denies cough, sputum, hemoptysis, ch in dyspnea, etc (last CXR 1/11 showed decr LVs & incr markings)... Pred 5mg  at 1.5 tab Qam, & MTX stable at 6 tabs per week... generally stable> continue same meds and follow ups.    Current Problem List:  ALLERGIC RHINITIS (ICD-477.9) - we discussed OTC meds avail to her  for her allergy symptoms- Claritin, Zyrtek, nasal saline, etc...  BRONCHITIS, RECURRENT (ICD-491.9) - no recent change in cough, no sputum, fever, etc...  PULMONARY FIBROSIS (ICD-515) - now on PREDNISONE 7.5mg /d (5mg tabs- 1.5 inAM),  NEB w/ Albut Prn,  &  GFN (she refuses Pulmicort as she says it made her sleepwalk and fall)...    ~  baseline CXR w/ incr markings, NAD...  ~  prev PFT's w/ mod restrictive dis...  s/p participation in pulm rehab program...   ~  CTChest in 1999 showed mild fibrosis, some bronchiectasis in LL's...  ~  last admitted 3/07 w/ AB exac superimposed on her underlying stable pulm fibrosis.Marland Kitchen.  ~  f/u CXR 5/10 = mild basilar reticular interstitial dis/ scarring, no change.  ~  f/u CXR 1/11 showed sl decr LVs & incr markings (?sl progression), tort Ao, etc...  DEGENERATIVE JOINT DISEASE (ICD-715.90) & BACK PAIN, LUMBAR (ICD-724.2) - w/ hx spinal stenosis and prev eval by DrWillis... daughter prev requested outpt physical therapy w/ hot pack and stimulation therapy... she takes Glucosamine/ Chondroitin daily... notes right knee pain & needs ortho eval... saw DrLucey, then DrDeveshwar- w/ Dx of seroneg RA.  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 she switched to [facility] & was changed to Pred, MTX, & Folic Acid...  ~  May10: right hand pain/ swelling worse since she stopped cortisone therapy per DrD... we re-started Medrol 8mg /d w/  improvement.  ~  labs here 5/10 showed Chem-OK, CBC-12.4, Uric-4.3, Sed-18...  ~  6/10 saw DrAnderson w/ change in regimen to PRED 5mg tabs- 3/d now, MTX 2.5mg - 6tabs/wk, Folic 1mg /d...  ~  7/11: she is currently taking Pred5mg - 1.5tabs + MTX 6tabs/wk...  OSTEOPENIA (ICD-733.90) - she takes Calcium supplement, MVI, Vit D 1000 u daily & Glucosamine...  ~  BMD in 2003 was OK w/ TScores > -1...   ~  f/u BMD 11/09 showed  TScores +1.5 in Spine, & -0.9 in right FemNeck... rec- continue calcium/ vits.  ~  Vit D level 11/09 = 33... encouraged to continue OTC Vit D supplements.  ~  labs 5/10 showed Vit D level = 39... rec> 1000 u daily.  ARTERIOSCLEROTIC DEMENTIA (ICD-290.40) - new prob per family in 2010... w/ MMSE showing 0/3 objects at 2 min after distraction... on ASA 81mg /d & ARICEPT 5mg /d started- they feel that this is helping...  ~  7/10:  reviewed med Rx & very important visitng nurse help w/ meds.  ~  7/11:  they are content to continue the Aricept at 5mg  dose.  Hx of PARESTHESIA (ICD-782.0) - prev eval by neuro in [place] in 2006 was neg...she takes MVI daily.  ANXIETY (ICD-300.00)  MULTIPLE  MYELOMA (ICD-203.00) - labs showing low IgM level & referred to Heme/Onc w/ eval DrOdogwu showing a "smoldering mult myeloma"- SPE did not show an MSpike, but IEP showed a monoclonal IgA lambda protein (light chains were neg).Marland KitchenMarland KitchenBone marrow showed hypercellular marrow w/ 20% plasma cells... decision made for observational protocol...  ~  6/11:  [facility]> myeloma markers are stable & Q62mo observation to continue.  She had a PNEUMOVAX in 2007.   Preventive Screening-Counseling & Management  Alcohol-Tobacco     Smoking Status: never  Allergies (verified): No Known Drug Allergies  Comments:  Nurse/Medical Assistant: The patient's medications and allergies were reviewed with the patient and were updated in the Medication and Allergy Lists.  Past History:  Past Medical History: ALLERGIC RHINITIS (ICD-477.9) BRONCHITIS, RECURRENT (ICD-491.9) PULMONARY FIBROSIS (ICD-515) DEGENERATIVE JOINT DISEASE (ICD-715.90) BACK PAIN, LUMBAR (ICD-724.2) SERONEGATIVE RHEUMATOID ARTHRITIS (ICD-714.0) OSTEOPENIA (ICD-733.90) ARTERIOSCLEROTIC DEMENTIA (ICD-290.40) Hx of PARESTHESIA (ICD-782.0) MULTIPLE  MYELOMA (ICD-203.00) ANXIETY (ICD-300.00)  Past Surgical  History: Appendectomy Hysterectomy  Family History: Reviewed history from 09/19/2008 and no changes required. mother deceased age 5 from lung cancer---hx of asthma father deceased age 62 from heart failure--hx of DM 1 brother deceased in his 25's from complications of DM 1 brother deceased in his 82's from complications of DM 1 sister deceased in her 35's from lung cancer  Social History: Reviewed history from 07/17/2008 and no changes required. Retired Never Smoked no alcohol   Review of Systems      See HPI       The patient complains of dyspnea on exertion and difficulty walking.  The patient denies anorexia, fever, weight loss, weight gain, vision loss, decreased hearing, hoarseness, chest pain, syncope, peripheral edema, prolonged cough, headaches, hemoptysis, abdominal pain, melena, hematochezia, severe indigestion/heartburn, hematuria, incontinence, muscle weakness, suspicious skin lesions, transient blindness, depression, unusual weight change, abnormal bleeding, enlarged lymph nodes, and angioedema.    Vital Signs:  Patient profile:   77 year old female Height:      64 inches Weight:      171 pounds BMI:     29.46 O2 Sat:      100 % on Room air Temp:     97.2 degrees F oral Pulse rate:  70 / minute BP sitting:   120 / 70  (left arm) Cuff size:   regular  Vitals Entered By: Randell Loop CMA (September 17, 2009 10:43 AM)  O2 Sat at Rest %:  100 O2 Flow:  Room air CC: 6 month ROV & review of mult medical problems... Is Patient Diabetic? No Pain Assessment Patient in pain? no      Comments no changes in meds today--pt brought in meds today   Physical Exam  Additional Exam:  WD, WN, 77 y/o BF in NAD... GENERAL:  Alert, pleasant & cooperative... HEENT:  [place]/AT, EOM-wnl, PERRLA, EACs-clear, TMs-wnl, NOSE-clear, THROAT-clear & wnl. NECK:  Supple w/ fairROM; no JVD; normal carotid impulses w/o bruits; no thyromegaly or nodules palpated; no lymphadenopathy. CHEST:   velcro rales about 1/2 way up, no wheezing, no rhonchi, no signs of consolidation... HEART:  Regular Rhythm; without murmurs/ rubs/ or gallops heard... ABDOMEN:  Soft & nontender; normal bowel sounds; no organomegaly or masses detected. EXT:  mild arthritic changes in hands; +arthritic changes in right knee & shoulder; no varicose veins, +venous insuffic, no edema. NEURO:  CN's intact; no focal neuro deficits... DERM:  No lesions noted; no rash etc...    Impression & Recommendations:  Problem # 1:  PULMONARY FIBROSIS (ICD-515) She is clinically stable>  continue current Rx...  Problem # 2:  SERONEGATIVE RHEUMATOID ARTHRITIS (ICD-714.0) Followed by [facility] on MTX & Pred... continue same.  Problem # 3:  MULTIPLE  MYELOMA (ICD-203.00) Followed by DrOdogwu on Q53mo protocol & Myeloma markers are stable....  Problem # 4:  OTHER MEDICAL PROBLEMS AS NOTED>>>  Complete Medication List: 1)  Adult Aspirin Ec Low Strength 81 Mg Tbec (Aspirin) .... Take 1 tablet by mouth once a day 2)  Mucinex 600 Mg Xr12h-tab (Guaifenesin) .... Take 1-2 tabs by mouth two times a day w/ fluids.Marland KitchenMarland Kitchen 3)  Prednisone 5 Mg Tabs (Prednisone) .... Take as directed... 4)  Methotrexate 2.5 Mg Tabs (Methotrexate sodium) .... Take as directed by dranderson (currently 6 tabs once per week) 5)  Folic Acid 1 Mg Tabs (Folic acid) .... Take 1 tab daily.Marland KitchenMarland Kitchen 6)  Centrum Silver Tabs (Multiple vitamins-minerals) .... Take 1 tablet by mouth once a day 7)  Vitamin D 1000 Unit Tabs (Cholecalciferol) .... Take 1 tablet by mouth once a day 8)  Ferrous Sulfate 325 (65 Fe) Mg Tbec (Ferrous sulfate) .... Take 1 tablet by mouth once a day 9)  Oxycodone-acetaminophen 5-325 Mg Tabs (Oxycodone-acetaminophen) .... Take one tablet by mouth every 6 hours as needed for pain 10)  Aricept 5 Mg Tabs (Donepezil hcl) .... Take 1 tab by mouth once daily...  Patient Instructions: 1)  Today we updated your med list- see below.... 2)  Continue your  current meds the same... 3)  Continue your regular follow up w/ DrAnderson for Rheumatology, and DrOdogwu for Hematology.Marland KitchenMarland Kitchen 4)  Call for any questions.Marland KitchenMarland Kitchen 5)  Please schedule a follow-up appointment in 6 months, and we will plan on rechecking your CXR at that time.Marland KitchenMarland Kitchen

## 2010-04-15 NOTE — Progress Notes (Signed)
Summary: fyi  Phone Note Call from Patient Call back at [phone]   Caller: [place] ( carolyn Call For: nadel Summary of Call: pt's b/p  is 72/54 Initial call taken by: Rickard Patience,  December 03, 2009 3:46 PM  Follow-up for Phone Call        spoke with carolyn at the health dept and she verefied pt's b/p was this low, I called and spoke with mrs Puopolo and she stated she felt fine she denies any dizziness, excessive sleepiness or any other symptoms told pt that this is a low b/p and in orer for [place] to know if this is right she would need to be seen for appt, pt unable to come in today so i made appt for 10am with sn, also advised pt if she has symptoms to call 911 or go to the closest ER--pt verbalized understanding Follow-up by: Philipp Deputy CMA,  December 03, 2009 4:14 PM

## 2010-04-15 NOTE — Progress Notes (Signed)
Summary: weak  Phone Note Call from Patient Call back at Home Phone [phone]   Caller: Daughter Cedar Sink Call For: nadel Summary of Call: pt is weak and liver level is worst.  Daughter [place] Sink called again, wants to know if she should take mom to er or does she need appt.Darletta Moll  February 01, 2010 10:56 AM  Initial call taken by: Rickard Patience,  February 01, 2010 10:54 AM  Follow-up for Phone Call        FYI - Offered pt an appt with Tammy Parrett on 02-02-10.  Pt's daughter reports that she is to weak to  come to office and she is going to take her to the ER to be evaluated. Abigail Miyamoto RN  February 01, 2010 11:31 AM   Additional Follow-up for Phone Call Additional follow up Details #1::        SN is aware of message and stated ER is appropriate. Randell Loop CMA  February 01, 2010 2:58 PM

## 2010-04-15 NOTE — Progress Notes (Signed)
Summary: meds  Phone Note Call from Patient   Caller: Daughter Call For: nadel Summary of Call: pt's daughter [place] Sink wants to speak to nurse about OTC meds. [phone] Initial call taken by: Tivis Ringer, CNA,  Jul 16, 2009 3:45 PM  Follow-up for Phone Call        Pt daughter states homehealth nurse wanted to clarify some of pt meds. Pt is supposed to take folic acid 1 mg daily, but insurance will not cover this, so they were wondering can she take OTC folic Acid ? Also pt was prescribed vitamin d 1000, but they picked up Vitamin d 2000, so the nurse wanted to know could the pt take vit d 2000 three times a week instead of vit d 1000 daily? they are unable to cut pills in half. Please advise.Carron Curie CMA  Jul 16, 2009 4:00 PM   Additional Follow-up for Phone Call Additional follow up Details #1::        per SN--called and spoke with pts daughter [place] Sink and she stated that Dr. Dareen Piano prescribed those meds for pt and she stated that both docs said the same thing---ok to take the vit d 2000units daily and otc folic acid 400mg  daily.  pt will take these as directed Additional Follow-up by: Randell Loop CMA,  Jul 16, 2009 5:05 PM

## 2010-04-15 NOTE — Progress Notes (Signed)
Summary: [place]  Phone Note Other Incoming   Caller: Shannon- Summary of Call: pt is wanting to be seen by dr Kriste Basque pt is at [facility] [address] she has been asking for you everyday Initial call taken by: Lacinda Axon,  February 11, 2010 11:14 AM  Follow-up for Phone Call        Will send to Sn as FYI.Michel Bickers [facility]  February 11, 2010 11:37 AM   SN is aware of message Randell Loop [facility]  February 11, 2010 1:55 PM

## 2010-04-15 NOTE — Progress Notes (Signed)
Summary: speak to nurse  Phone Note From Other Clinic Call back at [phone]   Caller: carolyn chaffin//[facility] Call For: nadel Summary of Call: Following up on ov from 9/23. Initial call taken by: Darletta Moll,  December 07, 2009 2:07 PM  Follow-up for Phone Call        Spoke with Eber Jones and notified that pt was doing well at 12/04/09 visit and BP was better 120/70.  No med changes were made.   Follow-up by: Vernie Murders,  December 07, 2009 2:52 PM

## 2010-04-15 NOTE — Progress Notes (Signed)
Summary: [facility] w/ [facility]  Phone Note From Other Clinic   Caller: nurse w/ [facility] Call For: nadel Summary of Call: nurse-carolyn chasen- requests to speak to dr Jodelle Green nurse at her convenience re: pt. call office # [phone] (if no answer pls call cell [phone] as nurse may be out making another pt visit.  Initial call taken by: Tivis Ringer, CNA,  February 10, 2010 4:01 PM  Follow-up for Phone Call        lmomtcb for carolyn Randell Loop [facility]  February 11, 2010 9:39 AM    carolyn called me back and stated that she is trying to get placement for pt---she stated that she is unable to care for herself and her daughter that lives in [place] is unable to care for her as well----pt is currently in the hospital at this time and carolyn is going to see pt on friday---on the phone note from 11-21 when the daughter called and wanted to know if she should take the pt to the er for eval---pts daughter ended up taking the pt to [place] for thanksgiving and had to take her to the er there and then ended up taking her out of the hospital and when they came back to [place] she took her to the ER here and thats when she got admitted.  carolyn will keep [place] posted on what all is going on with pt and if they end up placing her  Randell Loop CMA  February 11, 2010 9:58 AM

## 2010-04-15 NOTE — Assessment & Plan Note (Signed)
Summary: hfu/la   CC:  3 month ROV & post hospital follow up....  History of Present Illness: 77 y/o BF here for a follow up visit... she has multiple medical problems as noted below... Followed for general medical purposes w/ hx of seroneg RA & underlying pulm fibrosis, DJD & LBP, Osteopenia, mild dementia, and Monoclonal Gammopathy/ smoldering myeloma per DrOdogwu... also followed by [facility] for Rheum, and DrLucey for Ortho >> she was hosp 11/11 w/ jaundice & abd discomfort w/ dx of Pancreatic cancer...   ~  September 19, 2008:  here w/ a friend who drove today, daughter in [place], visiting nurse from [place] comes once every 2 weeks and fills pill cases for her to take & hopefully monitoring for proper usage... reviewed meds (she brought bottles today)- the MTX is empty & due for refill but she is clueless- I called pharmacy... the situation is suboptimal but the best family can arrange... caution: she's taking Pred 5mg - 3/d, and MTX 2.5mg - 6tabs once per week... c/o some knee pain, but overall is improved medically.   ~  April 07, 2009:  30mo ROV & here by herself today- apparently visiting nurses still fill her pill cases for her & monitor compliance... she saw DrAnderson 11/10- tapered to 5mg  Pred & continued on 6 MTX weekly... 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 decision made for observational protocol.   ~  September 17, 2009:  she had f/u DrOdogwu 6/11- stable myeloma markers w/ Q30mo observation recommended... breathing is OK- denies cough, sputum, hemoptysis, ch in dyspnea, etc (last CXR 1/11 showed decr LVs & incr markings)... Pred 5mg  at 1.5 tab Qam, & MTX stable at 6 tabs per week... generally stable> continue same meds and follow ups.   ~  December 04, 2009:   Add-on appt at request of home health nurse for low BP 72/54> pt admits to being "a little tired" but overall feels well- denies weakness, postural symptoms, dizzy, syncope, falls, etc... she is not on any vasoactive meds & doesn't have hx of HBP, no recent URI, gastroenteritis, or urinary symptoms... she has DJD & seroneg RA followed by Rheum... BP by nurse here= 118/68 & I check 120/70 w/o postural changes... we decided to check routine labs to be sure all is OK...   ~  March 05, 2010:  she was hosp 11/11 w/ abd discomfort & jaundice> diagnosed w/ pancreatic cancer w/ stent placed in common bile duct >> she was transfered to [facility] (notes from them are pending)... she has DCMed list= SEE BELOW:  Current Meds:  ADULT ASPIRIN EC LOW STRENGTH 81 MG  TBEC (ASPIRIN) Take 1 tablet by mouth once a day PREDNISONE 5 MG TABS (PREDNISONE) take as directed... PROTONIX 20 MG TBEC (PANTOPRAZOLE SODIUM) Take 1 tablet by mouth once a day ZOFRAN 4 MG TABS (ONDANSETRON HCL) as needed every 4-6 hours for nausea MIRALAX  POWD (POLYETHYLENE GLYCOL 3350) once daily GAS-X EXTRA STRENGTH 125 MG CHEW (SIMETHICONE) take one tablet by mouth every 6 hours as needed ARICEPT 5 MG TABS (DONEPEZIL HCL) take 1 tab by mouth once daily... FENTANYL 25 MCG/HR PT72 (FENTANYL) change the patch every 3d... TYLENOL 325 MG TABS (ACETAMINOPHEN) take 1-2 tabs every 4-6H as needed FOLIC ACID 1 MG TABS (FOLIC ACID) take  1 tab daily... VITAMIN D 1000 UNIT TABS (CHOLECALCIFEROL) Take 1 tablet by mouth once a day MEGESTROL ACETATE 20 MG TABS (MEGESTROL ACETATE) Take 1 tablet by mouth once a day    Current Problem List:  ALLERGIC RHINITIS (ICD-477.9) - we discussed OTC meds avail to her for her allergy symptoms- Claritin, Zyrtek, nasal saline, etc...  BRONCHITIS, RECURRENT (ICD-491.9) - no recent change in cough, no sputum, fever, etc...  PULMONARY FIBROSIS (ICD-515) - now on PREDNISONE 7.5mg /d (5mg tabs- 1.5 inAM),  NEB w/ Albut Prn,   &  GFN (she refuses Pulmicort as she says it made her sleepwalk and fall)...    ~  baseline CXR w/ incr markings, NAD...  ~  prev PFT's w/ mod restrictive dis...  s/p participation in pulm rehab program...   ~  CTChest in 1999 showed mild fibrosis, some bronchiectasis in LL's...  ~  last admitted 3/07 w/ AB exac superimposed on her underlying stable pulm fibrosis.Marland Kitchen.  ~  f/u CXR 5/10 = mild basilar reticular interstitial dis/ scarring, no change.  ~  f/u CXR 1/11 showed sl decr LVs & incr markings (?sl progression), tort Ao, etc...  ADENOCARCINOMA, PANCREAS (ICD-157.9)  ** SEE ABOVE ** new diagnosis 11/11  ~  HOSP DCSummary 02/22/10 w/ Tx to [facility] reviewed...  DEGENERATIVE JOINT DISEASE (ICD-715.90) & BACK PAIN, LUMBAR (ICD-724.2) - w/ hx spinal stenosis and prev eval by DrWillis... daughter prev requested outpt physical therapy w/ hot pack and stimulation therapy... she takes Glucosamine/ Chondroitin daily... notes right knee pain & needs ortho eval... saw DrLucey, then DrDeveshwar- w/ Dx of seroneg RA.  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 she switched to [facility] & was changed to Pred, MTX, & Folic Acid...  ~  May10: right hand pain/ swelling worse since she stopped cortisone therapy per DrD... we re-started Medrol 8mg /d w/ improvement.  ~  labs here 5/10 showed Chem-OK, CBC-12.4, Uric-4.3, Sed-18...  ~  6/10 saw DrAnderson w/ change in regimen to PRED 5mg tabs- 3/d now, MTX 2.5mg - 6tabs/wk, Folic 1mg /d...  ~  7/11: she is currently taking Pred5mg - 1.5tabs + MTX 6tabs/wk...  OSTEOPENIA (ICD-733.90) - she takes Calcium supplement, MVI, Vit D 1000 u daily & Glucosamine...  ~  BMD in 2003 was OK w/ TScores > -1...   ~  f/u BMD 11/09 showed TScores +1.5 in Spine, & -0.9 in right FemNeck... rec- continue calcium/ vits.  ~  Vit D level 11/09 = 33... encouraged to  continue OTC Vit D supplements.  ~  labs 5/10 showed Vit D level = 39... rec> 1000 u daily.  ARTERIOSCLEROTIC DEMENTIA (ICD-290.40) - new prob per family in 2010... w/ MMSE showing 0/3 objects at 2 min after distraction... on ASA 81mg /d & ARICEPT 5mg /d started- they feel that this is helping...  ~  7/10:  reviewed med Rx & very important visitng nurse help w/ meds.  ~  7/11:  they are content to continue the Aricept at 5mg  dose.  Hx of PARESTHESIA (ICD-782.0) - prev eval by neuro in [place] in 2006 was neg...she takes MVI daily.  ANXIETY (ICD-300.00)  MULTIPLE  MYELOMA (ICD-203.00) - labs showing low IgM level & referred to Heme/Onc w/ eval DrOdogwu showing a "smoldering mult myeloma"- SPE did not show an MSpike, but IEP showed a monoclonal IgA lambda protein (light chains were neg).Marland KitchenMarland KitchenBone marrow showed hypercellular marrow w/ 20% plasma cells... decision made for observational protocol...  ~  6/11:  [facility]> myeloma markers are stable & Q33mo observation to continue.  She had a PNEUMOVAX in 2007.   Preventive Screening-Counseling & Management  Alcohol-Tobacco     Smoking Status: quit     Year Quit: 1956     Pack years: 1ppdx 2 years  Allergies (verified): No Known Drug Allergies  Comments:  Nurse/Medical Assistant: The patient's medications and allergies were reviewed with the patient and were updated in the Medication and Allergy Lists.  Past History:  Past Medical History: ALLERGIC RHINITIS (ICD-477.9) BRONCHITIS, RECURRENT (ICD-491.9) PULMONARY FIBROSIS (ICD-515) ADENOCARCINOMA, PANCREAS (ICD-157.9) DEGENERATIVE JOINT DISEASE (ICD-715.90) BACK PAIN, LUMBAR (ICD-724.2) SERONEGATIVE RHEUMATOID ARTHRITIS (ICD-714.0) OSTEOPENIA (ICD-733.90) ARTERIOSCLEROTIC DEMENTIA (ICD-290.40) Hx of PARESTHESIA (ICD-782.0) MULTIPLE  MYELOMA (ICD-203.00) ANXIETY (ICD-300.00)  Past Surgical History: Appendectomy Hysterectomy Common Bile duct stent placed 12/11 for  pancreatic cancer  Family History: Reviewed history from 09/17/2009 and no changes required. mother deceased age 37 from lung cancer---hx of asthma father deceased age 71 from heart failure--hx of DM 1 brother deceased in his 56's from complications of DM 1 brother deceased in his 60's from complications of DM 1 sister deceased in her 53's from lung cancer  Social History: Reviewed history from 07/17/2008 and no changes required. Retired Never Smoked no alcohol  Smoking Status:  quit  Review of Systems      See HPI       The patient complains of anorexia, weight loss, decreased hearing, dyspnea on exertion, abdominal pain, muscle weakness, and difficulty walking.  The patient denies fever, weight gain, vision loss, hoarseness, chest pain, syncope, peripheral edema, prolonged cough, headaches, hemoptysis, melena, hematochezia, severe indigestion/heartburn, hematuria, incontinence, suspicious skin lesions, transient blindness, depression, unusual weight change, abnormal bleeding, enlarged lymph nodes, and angioedema.    Vital Signs:  Patient profile:   77 year old female Height:      64 inches Weight:      156.50 pounds BMI:     26.96 O2 Sat:      98 % on Room air Temp:     97.7 degrees F oral Pulse rate:   86 / minute BP sitting:   100 / 66  (right arm) Cuff size:   regular  Vitals Entered By: Randell Loop CMA (March 05, 2010 8:56 AM)  O2 Sat at Rest %:  98 O2 Flow:  Room air CC: 3 month ROV & post hospital follow up... Is Patient Diabetic? No Pain Assessment Patient in pain? yes      Comments meds updated today with pt and daughter   Physical Exam  Additional Exam:  WD, WN, 77 y/o BF in NAD... weak & she has obviously lost weight. GENERAL:  Alert, pleasant & cooperative... HEENT:  [place]/AT, EOM-wnl, PERRLA, EACs-clear, TMs-wnl, NOSE-clear, THROAT-clear & wnl. NECK:  Supple w/ fairROM; no JVD; normal carotid impulses w/o bruits; no thyromegaly or nodules palpated;  no lymphadenopathy. CHEST:  velcro rales about 1/2 way up, no wheezing, no rhonchi, no signs of consolidation... HEART:  Regular Rhythm; without murmurs/ rubs/ or gallops heard... ABDOMEN:  Soft & mild epig tender on palp; normal bowel sounds; no organomegaly or masses detected. EXT:  mild arthritic changes in hands; +arthritic changes in right knee & shoulder; no varicose veins, +venous insuffic, no edema. NEURO:  CN's intact; no focal neuro deficits... DERM:  No lesions noted; no rash etc...    MISC. Report  Procedure date:  03/05/2010  Findings:      DATA REVIEWED:  ~  [facility]  records & summary from [facility] 02/09/10 - 02/22/10...  ~  Records pending from [facility]...   Impression & Recommendations:  Problem # 1:  ADENOCARCINOMA, PANCREAS (ICD-157.9) Prognosis poor> they are not yet ready for hospice services... we will refer to [facility] & back to DrOdogwu for Oncology nmanagement. Orders: Oncology Referral (Oncology) DME Referral (DME)  Problem # 2:  PULMONARY FIBROSIS (ICD-515) Stable>  continue the Pred 5mg /d...  Problem # 3:  OTHER MEDICAL PROBLEMS AS NOTED>>>  Complete Medication List: 1)  Adult Aspirin Ec Low Strength 81 Mg Tbec (Aspirin) .... Take 1 tablet by mouth once a day 2)  Prednisone 5 Mg Tabs (Prednisone) .... Take as directed... 3)  Protonix 20 Mg Tbec (Pantoprazole sodium) .... Take 1 tablet by mouth once a day 4)  Zofran 4 Mg Tabs (Ondansetron hcl) .... As needed every 4-6 hours for nausea 5)  Miralax Powd (Polyethylene glycol 3350) .... Once daily 6)  Gas-x Extra Strength 125 Mg Chew (Simethicone) .... Take one tablet by mouth every 6 hours as needed 7)  Aricept 5 Mg Tabs (Donepezil hcl) .... Take 1 tab by mouth once daily.Marland KitchenMarland Kitchen 8)  Fentanyl 25 Mcg/hr Pt72 (Fentanyl) .... Change the patch every 3d... 9)  Tylenol 325 Mg Tabs (Acetaminophen) .... Take 1-2 tabs every 4-6h as needed 10)  Folic Acid 1 Mg Tabs (Folic acid) .... Take 1 tab daily... 11)  Vitamin D 1000 Unit  Tabs (Cholecalciferol) .... Take 1 tablet by mouth once a day 12)  Megestrol Acetate 20 Mg Tabs (Megestrol acetate) .... Take 1 tablet by mouth once a day  Patient Instructions: 1)  Today we updated your med list- see below.... 2)  Continue your current meds the same... 3)  We will be sure you have an appt w/ DrOdogwu for 1st week of January (as soon as she gets back in town). 4)  We will reorder the [facility] services.Marland KitchenMarland Kitchen 5)  Call if we can be of any assistance to you.Marland KitchenMarland Kitchen

## 2010-04-15 NOTE — Letter (Signed)
Summary: [facility]  [facility]   Imported By: Sherian Rein 04/06/2010 11:09:47  _____________________________________________________________________  External Attachment:    Type:   Image     Comment:   External Document

## 2010-04-15 NOTE — Progress Notes (Signed)
Summary: FYI  Phone Note Other Incoming Call back at [phone]   Caller: Shawn//[facility] [place] [facility] Summary of Call: Calling on behalf of pt, she wants Dr. Kriste Basque to come see her at [facility] rm 1407, diagnosis: pancreatic mass,probably adenocarcinoma. Ines Bloomer is calling on behalf of Dr. Gaynelle Adu ([facility]) and states she probably won't be a candidate for surgery. Initial call taken by: Darletta Moll,  February 10, 2010 9:38 AM  Follow-up for Phone Call        Called # provided above - spoke with Misty Stanley, nurse. Was told Ines Bloomer is a PA.  She will page shawn to call our office to verify message and inform SN does not see hospital pt's unless he admits them per Leigh.  Gweneth Dimitri RN  February 10, 2010 11:03 AM  Shawn returned call.  States she was only calling per pt request.  States pt was wanting to talk with Dr. Kriste Basque regarding her diagnoses for "reassurance" bc he has been her doctor for a very long time.  Advised I did not think SN sees pt in hospital any longer unless he admits them but I would get this message to him as FYI.  Shawn ok with above.    Follow-up by: Gweneth Dimitri RN,  February 10, 2010 11:08 AM  Additional Follow-up for Phone Call Additional follow up Details #1::        SN is aware of pt in hosp. Randell Loop CMA  February 10, 2010 3:01 PM

## 2010-04-15 NOTE — Progress Notes (Signed)
Summary: returned call  Phone Note Call from Patient Call back at Home Phone [phone]   Caller: Patient Call For: nadel Summary of Call: pt returned call from fri.  Initial call taken by: Tivis Ringer, CNA,  December 07, 2009 8:48 AM  Follow-up for Phone Call        Marliss Czar, did you call this pt?  I don't see anywhere in her chart where we were trying to get ahold of her.  Please advise.   Thank you.  Aundra Millet Reynolds LPN  December 07, 2009 9:00 AM   Additional Follow-up for Phone Call Additional follow up Details #1::        called and spoke with pt and she is aware of lab results per SN----all labs WNL and pt is to call for any other concerns. Randell Loop CMA  December 07, 2009 9:18 AM

## 2010-04-15 NOTE — Letter (Signed)
[facility]   Imported By: Lennie Odor 09/16/2009 16:16:08  _____________________________________________________________________  External Attachment:    Type:   Image     Comment:   External Document

## 2010-04-15 NOTE — Assessment & Plan Note (Signed)
Summary: 6 MONTHS F/U ///kp   CC:  6 month ROV & review of mult medical problems....  History of Present Illness: 77 y/o BF here for a follow up visit... she has multiple medical problems as noted below...     ~  ER visits:  1st in May09 w/ bronchitis (CXR- neg, NAD & Labs OK);  and 2nd in Jun09 after a fall- she says she was sleepwalking and fell (she blames this on the Pulmicort and refuses to use it any longer) w/ injury to left ribs & costal margin (Rib XRays were neg)...  she had physical therapy- states this was helpful w/ balance & strength...   ~  May 22, 2008:   c/o tires easily and sleeps alot... notes "cold" and aching in hands... since I saw her last in Nov09 she has been seen by [facility] for [facility], and he referred her to DrDeveshwar for Rheum- we do not have notes, but she has papers that indicate new Dx of seroneg RA & treated w/ Plaquenil & Pred... she is here by herself today- she needs help at home w/ her meds and she will discuss w/ her daughter.  ~  Jul 17, 2008:  mult problems recently & daughter very concerned- trying to handle things long dist is difficult... persistant pain, swelling, & weakness in right hand... plus weak, malaise, wt loss, appetite decr; but no N/ V or abd pain etc... DrDeveshwar ?stopped her Pred/ Medrol Rx, still on Plaquenil, & it sounds like she got worse off the cortisone therapy... they are frustrated and have set up a second opinion consult w/ DrAnderson... we decided to stop her Theo-24 & Aricept in case they were affecting her appetite, and gave her a Depo80 shot + restart Medrol at 8mg /d... f/u 2 weeks.  ~  Aug 01, 2008:  feeling some better- nausea resolved and appetite improved w/ 4# wt gain... we decided to restart the Aricept at 5mg /d, keep the Medrol 8mg /d for now.  ~  September 02, 2008:  she saw DrAnderson for Rheum eval 6/10- he changed Plaquenil to MTX 2.5mg - 6/wk + Folic Acid 1mg /d... he also changed the Medrol to Prednisone 5mg  tabs- take 4 tabs  daily, then decr to 3 tabs daily...   ** it is important to keep her regimen simple for compliance reasons...   ~  September 19, 2008:  here w/ a friend who drove today, daughter in [place], visiting nurse from [place] comes once every 2 weeks and fills pill cases for her to take & hopefully monitoring for proper usage... reviewed meds she brought bottles today- the MTX is empty & due for refill but she is clueless- I call pharmacy... the situation is suboptimal but the best family can arrange... caution: she's taking Pred 5mg - 3/d, and MTX 2.5mg - 6tabs once per week... c/o some knee pain, but overall is improved medically.   ~  April 07, 2009:  67mo ROV & here by herself today- apparently visiting nurses still fill her pill cases for her & monitor compliance... she saw DrAnderson 11/10- tapered to 5mg  Pred & continued on 6 MTX weekly... 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 decision made for observational protocol...    Current Problem List:  ALLERGIC RHINITIS (ICD-477.9) - we discussed  OTC meds avail to her for her allergy symptoms- Claritin, Zyrtek, nasal saline, etc...  BRONCHITIS, RECURRENT (ICD-491.9) - no recent change in cough, no sputum, fever, etc...  PULMONARY FIBROSIS (ICD-515) - now on PREDNISONE 15mg /d (5mg tabs- 3 inAM),  NEB w/ Albut Prn,  &  GFN ( she refuses Pulmicort as she says it made her sleepwalk and fall)...    ~  baseline CXR w/ incr markings, NAD...  f/u CXR 5/10 = mild basilar reticular interstitial dis/ scarring, no change.  ~  prev PFT's w/ mod restrictive dis...  s/p participation in pulm rehab program...   ~  CTChest in 1999 showed mild fibrosis, some bronchiectasis in LL's...  ~  last admitted 3/07 w/ AB exac superimposed on her underlying  stable pulm fibrosis...   DEGENERATIVE JOINT DISEASE (ICD-715.90) & BACK PAIN, LUMBAR (ICD-724.2) - w/ hx spinal stenosis and prev eval by DrWillis... daughter prev requested outpt physical therapy w/ hot pack and stimulation therapy... she takes Glucosamine/ Chondroitin daily... notes right knee pain & needs ortho eval... saw DrLucey, then DrDeveshwar- w/ Dx of seroneg RA.  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 she switched to [facility] & was changed to Pred, MTX, & Folic Acid...  ~  May10: right hand pain/ swelling worse since she stopped cortisone therapy per DrD... we re-started Medrol 8mg /d w/ improvement.  ~  labs here 5/10 showed Chem-OK, CBC-12.4, Uric-4.3, Sed-18...  ~  6/10 saw DrAnderson w/ change in regimen to PRED 5mg tabs- 3/d now, MTX 2.5mg - 6tabs/wk, Folic 1mg /d...  OSTEOPENIA (ICD-733.90) - she takes Calcium supplement, MVI, Vit D 1000 u daily & Glucosamine...  ~  BMD in 2003 was OK w/ TScores > -1...   ~  f/u BMD 11/09 showed TScores +1.5 in Spine, & -0.9 in right FemNeck... rec- continue calcium/ vits.  ~  Vit D level 11/09 = 33... encouraged to continue OTC Vit D supplements.  ~  labs 5/10 showed Vit D level = 39... rec> 1000 u daily.  ARTERIOSCLEROTIC DEMENTIA (ICD-290.40) - new prob per family in 2010... w/ MMSE showing 0/3 objects at 2 min after distraction... on ASA 81mg /d & ARICEPT 5mg /d started- they feel that this is helping...  ~  7/10:  reviewed med Rx & very important visitng nurse help w/ meds  Hx of PARESTHESIA (ICD-782.0) - prev eval by neuro in [place] in 2006 was neg...she takes MVI daily.  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 decision made for observational protocol...  She had a PNEUMOVAX in 2007.  Allergies (verified): No Known Drug Allergies  Comments:  Nurse/Medical Assistant: The patient's medications and allergies were reviewed with the patient and were updated in the Medication and Allergy Lists.  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 mother deceased age 80 from lung cancer---hx of asthma father deceased age 72 from heart failure--hx of  DM 1 brother deceased in his 73's from complications of DM 1 brother deceased in his 26's from complications of DM 1 sister deceased in her 54's from lung cancer  Social History: Reviewed history from 07/17/2008 and no changes required. Retired Never Smoked no alcohol   Review of Systems      See HPI  The patient denies anorexia, fever, weight loss, weight gain, vision loss, decreased hearing, hoarseness, chest pain, syncope, dyspnea on exertion, peripheral edema, prolonged cough, headaches, hemoptysis, abdominal pain, melena, hematochezia, severe indigestion/heartburn, hematuria, incontinence, muscle weakness, suspicious skin lesions, transient blindness, difficulty walking, depression, unusual weight change, abnormal bleeding, enlarged lymph nodes, and angioedema.    Vital Signs:  Patient profile:   77 year old female Height:      64 inches Weight:      175.25 pounds BMI:     30.19 O2 Sat:      98  % on Room air Temp:     97.2 degrees F oral Pulse rate:   81 / minute BP sitting:   110 / 64  (left arm) Cuff size:   regular  Vitals Entered By: Randell Loop CMA (April 07, 2009 10:48 AM)  O2 Sat at Rest %:  98 O2 Flow:  Room air CC: 6 month ROV & review of mult medical problems... Comments meds updated today--pt bought all her meds in today   Physical Exam  Additional Exam:  WD, WN, 77 y/o BF in NAD... GENERAL:  Alert, pleasant & cooperative... HEENT:  [place]/AT, EOM-wnl, PERRLA, EACs-clear, TMs-wnl, NOSE-clear, THROAT-clear & wnl. NECK:  Supple w/ fairROM; no JVD; normal carotid impulses w/o bruits; no thyromegaly or nodules palpated; no lymphadenopathy. CHEST:  velcro rales about 1/2 way up, no wheezing, no rhonchi, no signs of consolidation... HEART:  Regular Rhythm; without murmurs/ rubs/ or gallops heard... ABDOMEN:  Soft & nontender; normal bowel sounds; no organomegaly or masses detected. EXT:  right hand & wrist min swollen w/ decr grip; +arthritic changes in right knee & shoulder;  no varicose veins, +venous insuffic, no edema. NEURO:  CN's intact; no focal neuro deficits... DERM:  No lesions noted; no rash etc...      CXR  Procedure date:  04/07/2009  Findings:      CHEST - 2 VIEW   Comparison: 08/01/2008   Findings: Lung volumes are slightly less than on the prior study. Reticular opacities and lung bases have slightly progressed.  The aorta remains tortuous and the heart size is normal.  No pneumothorax. Slight mass effect along the right side of the trachea at the thoracic inlet.   IMPRESSION: Findings associated with pulmonary fibrosis have slightly progressed.   Possible right thyroid goiter.  Ultrasound of the neck may be helpful.   Read By:  Jolaine Click,  M.D.       Impression & Recommendations:  Problem # 1:  PULMONARY FIBROSIS (ICD-515) Essentially stable- asymptomatic, CXR  ~stable, remains on Pred now down to 5mg /d... Orders: T-2  View CXR ([id number])  Problem # 2:  SERONEGATIVE RHEUMATOID ARTHRITIS (ICD-714.0) Followed by [place] Pellant on MTX & Pred down to 5mg /d...  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  Problem # 4:  OTHER MEDICAL PROBLEMS AS NOTED>>>  Complete Medication List: 1)  Adult Aspirin Ec Low Strength 81 Mg Tbec (Aspirin) .... Take 1 tablet by mouth once a day 2)  Mucinex 600 Mg Xr12h-tab (Guaifenesin) .... Take 1-2 tabs by mouth two times a  day w/ fluids.Marland KitchenMarland Kitchen 3)  Prednisone 5 Mg Tabs (Prednisone) .... Take as directed... 4)  Methotrexate 2.5 Mg Tabs (Methotrexate sodium) .... Take as directed by dranderson (currently 6 tabs once per week) 5)  Folic Acid 1 Mg Tabs (Folic acid) .... Take 1 tab daily.Marland KitchenMarland Kitchen 6)  Centrum Silver Tabs (Multiple vitamins-minerals) .... Take 1 tablet by mouth once a day 7)  Vitamin D 1000 Unit Tabs (Cholecalciferol) .... Take 1 tablet by mouth once a day 8)  Ferrous Sulfate 325 (65 Fe) Mg Tbec (Ferrous sulfate) .... Take 1 tablet by mouth once a day 9)  Oxycodone-acetaminophen 5-325 Mg Tabs (Oxycodone-acetaminophen) .... Take one tablet by mouth every 6 hours as needed for pain 10)  Aricept 5 Mg Tabs (Donepezil hcl) .... Take 1 tab by mouth once daily...  Patient Instructions: 1)  Today we updated your med list- see below.... 2)  Continue your current meds the same... PREDNISONE is 5mg /d. 3)  Today we did your follow up CXR... please call the "phone tree" in a few days for your lab results.Marland KitchenMarland Kitchen 4)  Keep your follow up appts w/ DrAnderson & DrOdogwu... 5)  Call for any questions.Marland KitchenMarland Kitchen 6)  Please schedule a follow-up appointment in 6 months.

## 2010-04-15 NOTE — Progress Notes (Signed)
Summary: Appointment - Awaiting response from Dr Kriste Basque  Phone Note Call from Patient Call back at Home Phone [phone]   Caller: Daughter/Heather Dorsey Call For: Dr Kriste Basque Summary of Call: Heather Dorsey's daughter phoned and would like to speak to Dr. Jodelle Green nurse. Stated that she left a message on Friday. Her mother needs an appointment as soon as possible. Heather Dorsey can be reached at [phone] Initial call taken by: Vedia Coffer,  March 01, 2010 8:42 AM  Follow-up for Phone Call        Message from 02-26-10 is with Dr Kriste Basque for review.  Awaiting response. Abigail Miyamoto RN  March 01, 2010 9:41 AM   Additional Follow-up for Phone Call Additional follow up Details #1::        can change pts appt to 12-28 at 3pm.  thanks Randell Loop [facility]  March 01, 2010 9:50 AM     Additional Follow-up for Phone Call Additional follow up Details #2::    Pt's daughter informed of new appt date.  She is requesting records be sent to Dr in [facility] that pt has seen most recently.  Instructed her to have their office fax [place] a release stating exactly what records are needed and then we can send records. Abigail Miyamoto RN  March 01, 2010 10:01 AM   D

## 2010-04-15 NOTE — Assessment & Plan Note (Signed)
Summary: low b/p//td   CC:  Add-on per home health nurse for low BP....  History of Present Illness: 77 y/o BF here for a follow up visit... she has multiple medical problems as noted below... Followed for general medical purposes w/ hx of seroneg RA & underlying pulm fibrosis, DJD & LBP, Osteopenia, mild dementia, and Monoclonal Gammopathy/ smoldering myeloma per DrOdogwu... also followed by York Pellant for Rheum, and DrLucey for Ortho.   ~  September 19, 2008:  here w/ a friend who drove today, daughter in [place], visiting nurse from [place] comes once every 2 weeks and fills pill cases for her to take & hopefully monitoring for proper usage... reviewed meds (she brought bottles today)- the MTX is empty & due for refill but she is clueless- I called pharmacy... the situation is suboptimal but the best family can arrange... caution: she's taking Pred 5mg - 3/d, and MTX 2.5mg - 6tabs once per week... c/o some knee pain, but overall is improved medically.   ~  April 07, 2009:  52mo ROV & here by herself today- apparently visiting nurses still fill her pill cases for her & monitor compliance... she saw DrAnderson 11/10- tapered to 5mg  Pred & continued on 6 MTX weekly... 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 decision made for observational protocol.   ~  September 17, 2009:  she had f/u DrOdogwu 6/11- stable myeloma markers w/ Q91mo observation recommended... breathing is OK- denies cough, sputum, hemoptysis, ch in dyspnea, etc (last CXR 1/11 showed decr LVs & incr markings)... Pred 5mg  at 1.5 tab Qam, & MTX stable at 6 tabs per week... generally stable> continue same meds and follow ups.   ~  December 04, 2009:  Add-on appt at request of home health nurse for low BP 72/54> pt admits to  being "a little tired" but overall feels well- denies weakness, postural symptoms, dizzy, syncope, falls, etc... she is not on any vasoactive meds & doesn't have hx of HBP, no recent URI, gastroenteritis, or urinary symptoms... she has DJD & seroneg RA followed by Rheum... BP by nurse here= 118/68 & I check 120/70 w/o postural changes... we decided to check routine labs to be sure all is OK...   Current Problem List:  ALLERGIC RHINITIS (ICD-477.9) - we discussed OTC meds avail to her for her allergy symptoms- Claritin, Zyrtek, nasal saline, etc...  BRONCHITIS, RECURRENT (ICD-491.9) - no recent change in cough, no sputum, fever, etc...  PULMONARY FIBROSIS (ICD-515) - now on PREDNISONE 7.5mg /d (5mg tabs- 1.5 inAM),  NEB w/ Albut Prn,  &  GFN (she refuses Pulmicort as she says it made her sleepwalk and fall)...    ~  baseline CXR w/ incr markings, NAD...  ~  prev PFT's w/ mod restrictive dis...  s/p participation in pulm rehab program...   ~  CTChest in 1999 showed mild fibrosis, some bronchiectasis in LL's...  ~  last admitted 3/07 w/ AB exac superimposed on her underlying stable pulm fibrosis.Marland Kitchen.  ~  f/u CXR 5/10 = mild basilar reticular interstitial dis/ scarring, no change.  ~  f/u CXR 1/11 showed sl decr LVs & incr markings (?sl progression), tort Ao, etc...  DEGENERATIVE JOINT DISEASE (ICD-715.90) & BACK PAIN, LUMBAR (ICD-724.2) - w/ hx spinal stenosis and prev eval by DrWillis... daughter  prev requested outpt physical therapy w/ hot pack and stimulation therapy... she takes Glucosamine/ Chondroitin daily... notes right knee pain & needs ortho eval... saw DrLucey, then DrDeveshwar- w/ Dx of seroneg RA.  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 she switched to [facility] & was changed to Pred, MTX, & Folic Acid...  ~  May10: right hand pain/ swelling worse since she  stopped cortisone therapy per DrD... we re-started Medrol 8mg /d w/ improvement.  ~  labs here 5/10 showed Chem-OK, CBC-12.4, Uric-4.3, Sed-18...  ~  6/10 saw DrAnderson w/ change in regimen to PRED 5mg tabs- 3/d now, MTX 2.5mg - 6tabs/wk, Folic 1mg /d...  ~  7/11: she is currently taking Pred5mg - 1.5tabs + MTX 6tabs/wk...  OSTEOPENIA (ICD-733.90) - she takes Calcium supplement, MVI, Vit D 1000 u daily & Glucosamine...  ~  BMD in 2003 was OK w/ TScores > -1...   ~  f/u BMD 11/09 showed TScores +1.5 in Spine, & -0.9 in right FemNeck... rec- continue calcium/ vits.  ~  Vit D level 11/09 = 33... encouraged to continue OTC Vit D supplements.  ~  labs 5/10 showed Vit D level = 39... rec> 1000 u daily.  ARTERIOSCLEROTIC DEMENTIA (ICD-290.40) - new prob per family in 2010... w/ MMSE showing 0/3 objects at 2 min after distraction... on ASA 81mg /d & ARICEPT 5mg /d started- they feel that this is helping...  ~  7/10:  reviewed med Rx & very important visitng nurse help w/ meds.  ~  7/11:  they are content to continue the Aricept at 5mg  dose.  Hx of PARESTHESIA (ICD-782.0) - prev eval by neuro in [place] in 2006 was neg...she takes MVI daily.  ANXIETY (ICD-300.00)  MULTIPLE  MYELOMA (ICD-203.00) - labs showing low IgM level & referred to Heme/Onc w/ eval DrOdogwu showing a "smoldering mult myeloma"- SPE did not show an MSpike, but IEP showed a monoclonal IgA lambda protein (light chains were neg).Marland KitchenMarland KitchenBone marrow showed hypercellular marrow w/ 20% plasma cells... decision made for observational protocol...  ~  6/11:  [facility] from [facility] reviewed> myeloma markers are stable & Q15mo observation to continue.  She had a PNEUMOVAX in 2007.   Preventive Screening-Counseling & Management  Alcohol-Tobacco     Smoking Status: never     Year Quit: 1956     Pack years: 1ppdx 2 years  Allergies (verified): No Known Drug Allergies  Comments:  Nurse/Medical Assistant: The patient's medications and allergies were  reviewed with the patient and were updated in the Medication and Allergy Lists.  Past History:  Past Medical History: ALLERGIC RHINITIS (ICD-477.9) BRONCHITIS, RECURRENT (ICD-491.9) PULMONARY FIBROSIS (ICD-515) DEGENERATIVE JOINT DISEASE (ICD-715.90) BACK PAIN, LUMBAR (ICD-724.2) SERONEGATIVE RHEUMATOID ARTHRITIS (ICD-714.0) OSTEOPENIA (ICD-733.90) ARTERIOSCLEROTIC DEMENTIA (ICD-290.40) Hx of PARESTHESIA (ICD-782.0) MULTIPLE  MYELOMA (ICD-203.00) ANXIETY (ICD-300.00)  Past Surgical History: Appendectomy Hysterectomy  Family History: Reviewed history from 09/17/2009 and no changes required. mother deceased age 34 from lung cancer---hx of asthma father deceased age 39 from heart failure--hx of DM 1 brother deceased in his 76's from complications of DM 1 brother deceased in his 61's from complications of DM 1 sister deceased in her 67's from lung cancer  Social History: Reviewed history from 07/17/2008 and no changes required. Retired Never Smoked no alcohol   Review of Systems      See HPI       The patient complains of dyspnea on exertion.  The patient denies anorexia, fever, weight  loss, weight gain, vision loss, decreased hearing, hoarseness, chest pain, syncope, peripheral edema, prolonged cough, headaches, hemoptysis, abdominal pain, melena, hematochezia, severe indigestion/heartburn, hematuria, incontinence, muscle weakness, suspicious skin lesions, transient blindness, difficulty walking, depression, unusual weight change, abnormal bleeding, enlarged lymph nodes, and angioedema.    Vital Signs:  Patient profile:   77 year old female Height:      64 inches Weight:      171 pounds BMI:     29.46 O2 Sat:      99 % on Room air Temp:     97.7 degrees F oral Pulse rate:   72 / minute BP sitting:   120 / 70  (left arm) Cuff size:   regular  Vitals Entered By: Randell Loop CMA (December 04, 2009 11:06 AM)  O2 Sat at Rest %:  99 O2 Flow:  Room air CC: Add-on per  home health nurse for low BP... Is Patient Diabetic? No Pain Assessment Patient in pain? no      Comments meds updated today with pt   Physical Exam  Additional Exam:  WD, WN, 77 y/o BF in NAD... GENERAL:  Alert, pleasant & cooperative... HEENT:  [place]/AT, EOM-wnl, PERRLA, EACs-clear, TMs-wnl, NOSE-clear, THROAT-clear & wnl. NECK:  Supple w/ fairROM; no JVD; normal carotid impulses w/o bruits; no thyromegaly or nodules palpated; no lymphadenopathy. CHEST:  velcro rales about 1/2 way up, no wheezing, no rhonchi, no signs of consolidation... HEART:  Regular Rhythm; without murmurs/ rubs/ or gallops heard... ABDOMEN:  Soft & nontender; normal bowel sounds; no organomegaly or masses detected. EXT:  mild arthritic changes in hands; +arthritic changes in right knee & shoulder; no varicose veins, +venous insuffic, no edema. NEURO:  CN's intact; no focal neuro deficits... DERM:  No lesions noted; no rash etc...    EKG  Procedure date:  12/04/2009  Findings:      BMP (METABOL)   Sodium                    141 mEq/L                   135-145   Potassium                 4.8 mEq/L                   3.5-5.1   Chloride                  105 mEq/L                   96-112   Carbon Dioxide            28 mEq/L                    19-32   Glucose              [H]  102 mg/dL                   16-10   BUN                       9 mg/dL                     9-60   Creatinine                0.9 mg/dL  0.4-1.2   Calcium                   9.9 mg/dL                   2.1-30.8   GFR                       75.75 mL/min                >60  Hepatic/Liver Function Panel (HEPATIC)   Total Bilirubin           0.6 mg/dL                   6.5-7.8   Direct Bilirubin          0.1 mg/dL                   4.6-9.6   Alkaline Phosphatase      73 U/L                      39-117   AST                       24 U/L                      0-37   ALT                       16 U/L                      0-35    Total Protein             6.6 g/dL                    2.9-5.2   Albumin                   3.9 g/dL                    8.4-1.3  CBC Platelet w/Diff (CBCD)   White Cell Count          7.4 K/uL                    4.5-10.5   Red Cell Count            4.03 Mil/uL                 3.87-5.11   Hemoglobin                13.1 g/dL                   24.4-01.0   Hematocrit                39.3 %                      36.0-46.0   MCV                       97.6 fl                     78.0-100.0   Platelet Count            187.0 K/uL  150.0-400.0   Neutrophil %         [H]  85.1 %                      43.0-77.0   Lymphocyte %         [L]  9.7 %                       12.0-46.0   Monocyte %                4.5 %                       3.0-12.0   Eosinophils%              0.6 %                       0.0-5.0   Basophils %               0.1 %                       0.0-3.0  TSH (TSH)   FastTSH                   1.37 uIU/mL                 0.35-5.50   Impression & Recommendations:  Problem # 1:  ? of Hypotension>>> No hypotension found... pt is normotensive, no hx Hyper- or Hypo-, no vasoactive meds... we checked routine labs> all WNL.Marland KitchenMarland Kitchen Orders: TLB-BMP (Basic Metabolic Panel-BMET) (80048-METABOL) TLB-Hepatic/Liver Function Pnl (80076-HEPATIC) TLB-CBC Platelet - w/Differential (85025-CBCD) TLB-TSH (Thyroid Stimulating Hormone) (84443-TSH) T-Vitamin D (25-Hydroxy) ([address]-60454)  Problem # 2:  PULMONARY FIBROSIS (ICD-515) Stable>  chest exam w/o change & clinically stable on Pred currently 7.5mg /d per Rheum...  Problem # 3:  DEGENERATIVE JOINT DISEASE (ICD-715.90) She is followed by Renea Ee, for DJD, LBP, SeronegRA... continue current Rx. The following medications were removed from the medication list:    Oxycodone-acetaminophen 5-325 Mg Tabs (Oxycodone-acetaminophen) .Marland Kitchen... Take one tablet by mouth every 6 hours as needed for pain Her updated medication list for this problem  includes:    Adult Aspirin Ec Low Strength 81 Mg Tbec (Aspirin) .Marland Kitchen... Take 1 tablet by mouth once a day  Problem # 4:  ARTERIOSCLEROTIC DEMENTIA (ICD-290.40) Aware> she is managing well w/ help at home...  Problem # 5:  MULTIPLE  MYELOMA (ICD-203.00) Followed by DrOdogwu every 40mo...  Complete Medication List: 1)  Adult Aspirin Ec Low Strength 81 Mg Tbec (Aspirin) .... Take 1 tablet by mouth once a day 2)  Mucinex 600 Mg Xr12h-tab (Guaifenesin) .... Take 1-2 tabs by mouth two times a day w/ fluids.Marland KitchenMarland Kitchen 3)  Prednisone 5 Mg Tabs (Prednisone) .... Take as directed... 4)  Methotrexate 2.5 Mg Tabs (Methotrexate sodium) .... Take as directed by dranderson (currently 6 tabs once per week) 5)  Folic Acid 1 Mg Tabs (Folic acid) .... Take 1 tab daily.Marland KitchenMarland Kitchen 6)  Vitamin D 1000 Unit Tabs (Cholecalciferol) .... Take 1 tablet by mouth once a day 7)  Ferrous Sulfate 325 (65 Fe) Mg Tbec (Ferrous sulfate) .... Take 1 tablet by mouth once a day 8)  Aricept 5 Mg Tabs (Donepezil hcl) .... Take 1 tab by mouth once daily...  Other Orders: Flu Vaccine 47yrs + MEDICARE PATIENTS (U9811) Administration Flu vaccine - MCR (G0008)  Patient Instructions: 1)  Today we updated your med list- see below.... 2)  Continue your current meds the same for now... 3)  Today we did your follow up blood work... 4)  We will call you next week w/ the test results.Marland KitchenMarland Kitchen 5)  We gave you the 2011 Flu vaccine today... 6)  Call for any problems...   Flu Vaccine Consent Questions     Do you have a history of severe allergic reactions to this vaccine? no    Any prior history of allergic reactions to egg and/or gelatin? no    Do you have a sensitivity to the preservative Thimersol? no    Do you have a past history of Guillan-Barre Syndrome? no    Do you currently have an acute febrile illness? no    Have you ever had a severe reaction to latex? no    Vaccine information given and explained to patient? yes    Are you currently pregnant?  no    Lot Number:[id number]   Exp Date:09/11/2010   Site Given  Left Deltoid IMlu Gweneth Dimitri RN  December 04, 2009 12:01 PM

## 2010-04-15 NOTE — Progress Notes (Signed)
Summary: appt today  Phone Note Other Incoming   Caller: [facility] Summary of Call: Patient in a lot of distress.  [facility] calling asking for patient to be seen today to initiate hospice.  Also, needing pain management.  Please call daughter, @ [phone] Initial call taken by: Lehman Prom,  March 04, 2010 10:59 AM  Follow-up for Phone Call        called and spoke with pts daughter and PT from [facility] and they are aware of appt for pt in the am at 9 for HFU and to discuss starting with hospice. Randell Loop [facility]  March 04, 2010 11:17 AM

## 2010-04-15 NOTE — Progress Notes (Signed)
Summary: ? re: pt's meds  Phone Note From Other Clinic Call back at [phone]   Caller: Al Corpus - Nurse from Health Dept Call For: nadel Summary of Call: ? re: Aricept 5mg  - Said she was told by [place] not to take this med.  Is she suppose to continue this med? Initial call taken by: Eugene Gavia,  April 28, 2009 10:11 AM  Follow-up for Phone Call        OV from 04-07-09 was reviewed .  Nurse from health dept notified that  pt was not taken off of Aricept.  She will discuss this with pt and clarify that she should stay on this. Abigail Miyamoto RN  April 28, 2009 10:23 AM

## 2010-04-15 NOTE — Progress Notes (Signed)
Summary: REQUESTING SOONER APPT---see note dated 03/01/2010  Phone Note Call from Patient Call back at Home Phone [phone]   Caller: Daughter/[name] Call For: Dr. Kriste Basque Complaint: Abdominal Pain Summary of Call: Patients daughter Heather Dorsey phoned patient was diagnosised with cancer and they would like an appointment sooner than Mar 16, 2010 if possible. Heather Dorsey can be reached at her mothers home number [phone] Initial call taken by: Vedia Coffer,  February 26, 2010 3:05 PM  Follow-up for Phone Call        [facility] Heather Dorsey  February 26, 2010 3:54 PM  Heather Dorsey returned call.  She states pt recently in [facility] and diagnosed with ca.  Pt has an apt scheduled with SN for 03/16/10 with SN but they would like an appt sooner.  They are needing a hospice referral, would like to update SN on pt's conditiod, and give him her new med list.  They have recently been to [facility].  Aware Heather Dorsey is out of the office this evening but will call back on Monday. Leigh, pls advise if pt can be worked in.  Thanks! Follow-up by: Gweneth Dimitri RN,  February 26, 2010 4:19 PM  Additional Follow-up for Phone Call Additional follow up Details #1::        See phone note dated 03/01/2010. There are 2 msgs regarding the same thing. Additional Follow-up by: Michel Bickers CMA,  March 01, 2010 9:59 AM

## 2010-04-15 NOTE — Letter (Signed)
Summary: [facility]   Imported By: Lester Roslyn 04/11/2009 10:56:49  _____________________________________________________________________  External Attachment:    Type:   Image     Comment:   External Document
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
## 2010-04-15 NOTE — Progress Notes (Signed)
Summary: req to have sn see pt at [facility]  Phone Note Call from Patient Call back at Home Phone [phone]   Caller: Daughter-rita Call For: nadel Summary of Call: pt's daughter states that pt wants dr Kriste Basque to see her at [facility] room 1407 (even though daughter states that dr Kriste Basque has seen pt in hosp recently).  Initial call taken by: Tivis Ringer, CNA,  February 18, 2010 3:16 PM  Follow-up for Phone Call        will forward message to SN per pts request. Randell Loop [facility]  February 18, 2010 3:21 PM

## 2010-04-15 NOTE — Progress Notes (Signed)
Summary: prescription  Phone Note Call from Patient Call back at [phone]   Caller: Daughter/rita Call For: nadel Summary of Call: Need rx for [facility] to send a nurse to fix her mom's medicine box. Initial call taken by: Darletta Moll,  June 29, 2009 10:20 AM  Follow-up for Phone Call        [place], spoke with [place].  Per [place] S[place], social services is currently coming out to help pt organize her meds but they have to pay for this service.  States Medicare will pay for [facility] to come out and help with this if they have an order from MD.  Will forward to SN - pls advise if it is ok to place order for [facility] to help pt organize meds. Thanks! Gweneth Dimitri RN  June 29, 2009 11:10 AM   Additional Follow-up for Phone Call Additional follow up Details #1::        per SN---ok for this order---order has been placed. Randell Loop [facility]  June 29, 2009 12:03 PM

## 2010-04-15 NOTE — Progress Notes (Signed)
Summary: Pneumovax  Phone Note Other Incoming Call back at [phone]   Caller: Marcelle Smiling at hospital Summary of Call: Nurse Marcelle Smiling needs to know if/when patient has had pneumovax. Initial call taken by: Leonette Monarch,  February 09, 2010 1:43 PM  Follow-up for Phone Call        [place], spoke with Marcelle Smiling, nurse at [facility].  Pt is inpt rm 1235 at [facility].  Natasha requesting verification that pt had flu vac this year and if/when pt had pna vac.  Advised flu vac given on 12/04/2009 but no documentation in EMR of pna vac since 2008.  Advise would have to request paper chart to look back further for pna vac.  Marcelle Smiling stated this was not necesary that she would ask Dr. Christella Noa if he wanted pt to have pna vac.   Follow-up by: Gweneth Dimitri RN,  February 09, 2010 2:47 PM

## 2010-04-15 NOTE — Progress Notes (Signed)
Summary: results  Phone Note Call from Patient Call back at Home Phone [phone]   Caller: Patient Call For: Brittin Belnap Summary of Call: requests results of cxr Initial call taken by: Tivis Ringer, CNA,  April 17, 2009 10:03 AM  Follow-up for Phone Call        results not on phone tree.  I have printed resport. Please advise. Thanks. Carron Curie CMA  April 17, 2009 10:53 AM   per SN---cxr stable  fibrosis---NAD  pt is aware of results. Randell Loop CMA  April 17, 2009 1:42 PM

## 2010-04-15 NOTE — Miscellaneous (Signed)
Summary: Plan of Care/[facility] [employer] [facility]  Plan of Care/[facility] [facility]   Imported By: Sherian Rein 04/11/2009 08:46:30  _____________________________________________________________________  External Attachment:    Type:   Image     Comment:   External Document

## 2010-04-15 NOTE — Letter (Signed)
Summary: Consult/[facility]  Consult/[place]   Imported By: Sherian Rein 02/11/2010 12:37:42  _____________________________________________________________________  External Attachment:    Type:   Image     Comment:   External Document

## 2010-04-20 ENCOUNTER — Encounter: Payer: Self-pay | Admitting: Pulmonary Disease

## 2010-04-20 ENCOUNTER — Other Ambulatory Visit: Payer: Self-pay | Admitting: Hematology and Oncology

## 2010-04-20 ENCOUNTER — Encounter (HOSPITAL_BASED_OUTPATIENT_CLINIC_OR_DEPARTMENT_OTHER): Payer: PRIVATE HEALTH INSURANCE | Admitting: Hematology and Oncology

## 2010-04-20 DIAGNOSIS — C9000 Multiple myeloma not having achieved remission: Secondary | ICD-10-CM

## 2010-04-20 DIAGNOSIS — C259 Malignant neoplasm of pancreas, unspecified: Secondary | ICD-10-CM

## 2010-04-20 DIAGNOSIS — J8410 Pulmonary fibrosis, unspecified: Secondary | ICD-10-CM

## 2010-04-20 DIAGNOSIS — Z5112 Encounter for antineoplastic immunotherapy: Secondary | ICD-10-CM

## 2010-04-20 DIAGNOSIS — D804 Selective deficiency of immunoglobulin M [IgM]: Secondary | ICD-10-CM

## 2010-04-20 LAB — COMPREHENSIVE METABOLIC PANEL
AST: 17 U/L (ref 0–37)
Albumin: 3.2 g/dL — ABNORMAL LOW (ref 3.5–5.2)
Alkaline Phosphatase: 81 U/L (ref 39–117)
Glucose, Bld: 101 mg/dL — ABNORMAL HIGH (ref 70–99)
Potassium: 3.8 mEq/L (ref 3.5–5.3)
Sodium: 140 mEq/L (ref 135–145)
Total Protein: 5.8 g/dL — ABNORMAL LOW (ref 6.0–8.3)

## 2010-04-20 LAB — CBC WITH DIFFERENTIAL/PLATELET
Eosinophils Absolute: 0.1 10*3/uL (ref 0.0–0.5)
MCV: 93.3 fL (ref 79.5–101.0)
MONO%: 10.9 % (ref 0.0–14.0)
NEUT#: 4.1 10*3/uL (ref 1.5–6.5)
RBC: 3.49 10*6/uL — ABNORMAL LOW (ref 3.70–5.45)
RDW: 15.4 % — ABNORMAL HIGH (ref 11.2–14.5)
WBC: 6 10*3/uL (ref 3.9–10.3)
lymph#: 1.1 10*3/uL (ref 0.9–3.3)

## 2010-04-21 ENCOUNTER — Encounter (HOSPITAL_BASED_OUTPATIENT_CLINIC_OR_DEPARTMENT_OTHER): Payer: PRIVATE HEALTH INSURANCE | Admitting: Hematology and Oncology

## 2010-04-21 DIAGNOSIS — C259 Malignant neoplasm of pancreas, unspecified: Secondary | ICD-10-CM

## 2010-04-21 DIAGNOSIS — Z452 Encounter for adjustment and management of vascular access device: Secondary | ICD-10-CM

## 2010-04-21 DIAGNOSIS — Z5111 Encounter for antineoplastic chemotherapy: Secondary | ICD-10-CM

## 2010-04-28 ENCOUNTER — Other Ambulatory Visit: Payer: Self-pay | Admitting: Hematology and Oncology

## 2010-04-28 ENCOUNTER — Ambulatory Visit (HOSPITAL_COMMUNITY)
Admission: RE | Admit: 2010-04-28 | Discharge: 2010-04-28 | Disposition: A | Discharge status: Home / Self Care | Payer: PRIVATE HEALTH INSURANCE | Source: Ambulatory Visit | Attending: Hematology and Oncology | Admitting: Hematology and Oncology

## 2010-04-28 ENCOUNTER — Encounter (HOSPITAL_BASED_OUTPATIENT_CLINIC_OR_DEPARTMENT_OTHER): Payer: PRIVATE HEALTH INSURANCE | Admitting: Hematology and Oncology

## 2010-04-28 DIAGNOSIS — C259 Malignant neoplasm of pancreas, unspecified: Secondary | ICD-10-CM

## 2010-04-28 DIAGNOSIS — Z5111 Encounter for antineoplastic chemotherapy: Secondary | ICD-10-CM

## 2010-04-28 DIAGNOSIS — Z9221 Personal history of antineoplastic chemotherapy: Secondary | ICD-10-CM | POA: Insufficient documentation

## 2010-04-28 DIAGNOSIS — T82898A Other specified complication of vascular prosthetic devices, implants and grafts, initial encounter: Secondary | ICD-10-CM | POA: Insufficient documentation

## 2010-04-28 DIAGNOSIS — Z452 Encounter for adjustment and management of vascular access device: Secondary | ICD-10-CM

## 2010-04-28 DIAGNOSIS — Y849 Medical procedure, unspecified as the cause of abnormal reaction of the patient, or of later complication, without mention of misadventure at the time of the procedure: Secondary | ICD-10-CM | POA: Insufficient documentation

## 2010-04-28 LAB — CBC WITH DIFFERENTIAL/PLATELET
BASO%: 0.9 % (ref 0.0–2.0)
Basophils Absolute: 0 10e3/uL (ref 0.0–0.1)
EOS%: 1.1 % (ref 0.0–7.0)
Eosinophils Absolute: 0 10e3/uL (ref 0.0–0.5)
HCT: 32.5 % — ABNORMAL LOW (ref 34.8–46.6)
HGB: 10.9 g/dL — ABNORMAL LOW (ref 11.6–15.9)
LYMPH%: 38.8 % (ref 14.0–49.7)
MCH: 31.1 pg (ref 25.1–34.0)
MCHC: 33.5 g/dL (ref 31.5–36.0)
MCV: 92.8 fL (ref 79.5–101.0)
MONO#: 0.4 10e3/uL (ref 0.1–0.9)
MONO%: 11.4 % (ref 0.0–14.0)
NEUT#: 1.8 10e3/uL (ref 1.5–6.5)
NEUT%: 47.8 % (ref 38.4–76.8)
Platelets: 342 10e3/uL (ref 145–400)
RBC: 3.5 10e6/uL — ABNORMAL LOW (ref 3.70–5.45)
RDW: 16.4 % — ABNORMAL HIGH (ref 11.2–14.5)
WBC: 3.8 10e3/uL — ABNORMAL LOW (ref 3.9–10.3)
lymph#: 1.5 10e3/uL (ref 0.9–3.3)

## 2010-04-28 LAB — BASIC METABOLIC PANEL
BUN: 12 mg/dL (ref 6–23)
Creatinine, Ser: 0.85 mg/dL (ref 0.40–1.20)
Glucose, Bld: 139 mg/dL — ABNORMAL HIGH (ref 70–99)

## 2010-04-29 ENCOUNTER — Other Ambulatory Visit: Payer: Self-pay | Admitting: Hematology and Oncology

## 2010-04-30 ENCOUNTER — Ambulatory Visit (HOSPITAL_COMMUNITY)
Admission: RE | Admit: 2010-04-30 | Discharge: 2010-04-30 | Disposition: A | Discharge status: Home / Self Care | Payer: PRIVATE HEALTH INSURANCE | Source: Ambulatory Visit | Attending: Hematology and Oncology | Admitting: Hematology and Oncology

## 2010-04-30 DIAGNOSIS — C250 Malignant neoplasm of head of pancreas: Secondary | ICD-10-CM | POA: Insufficient documentation

## 2010-04-30 DIAGNOSIS — Y849 Medical procedure, unspecified as the cause of abnormal reaction of the patient, or of later complication, without mention of misadventure at the time of the procedure: Secondary | ICD-10-CM | POA: Insufficient documentation

## 2010-04-30 DIAGNOSIS — T82598A Other mechanical complication of other cardiac and vascular devices and implants, initial encounter: Secondary | ICD-10-CM | POA: Insufficient documentation

## 2010-05-04 ENCOUNTER — Telehealth: Payer: Self-pay | Admitting: Pulmonary Disease

## 2010-05-05 ENCOUNTER — Other Ambulatory Visit: Payer: Self-pay | Admitting: Hematology and Oncology

## 2010-05-05 ENCOUNTER — Encounter (HOSPITAL_BASED_OUTPATIENT_CLINIC_OR_DEPARTMENT_OTHER): Payer: PRIVATE HEALTH INSURANCE | Admitting: Hematology and Oncology

## 2010-05-05 DIAGNOSIS — Z5111 Encounter for antineoplastic chemotherapy: Secondary | ICD-10-CM

## 2010-05-05 DIAGNOSIS — C259 Malignant neoplasm of pancreas, unspecified: Secondary | ICD-10-CM

## 2010-05-05 LAB — BASIC METABOLIC PANEL WITH GFR
BUN: 11 mg/dL (ref 6–23)
CO2: 21 meq/L (ref 19–32)
Calcium: 9.2 mg/dL (ref 8.4–10.5)
Chloride: 110 meq/L (ref 96–112)
Creatinine, Ser: 0.76 mg/dL (ref 0.40–1.20)
Glucose, Bld: 122 mg/dL — ABNORMAL HIGH (ref 70–99)
Potassium: 3.8 meq/L (ref 3.5–5.3)
Sodium: 142 meq/L (ref 135–145)

## 2010-05-05 LAB — CBC WITH DIFFERENTIAL/PLATELET
BASO%: 1.1 % (ref 0.0–2.0)
Basophils Absolute: 0 10e3/uL (ref 0.0–0.1)
EOS%: 1.8 % (ref 0.0–7.0)
Eosinophils Absolute: 0.1 10e3/uL (ref 0.0–0.5)
HCT: 32.8 % — ABNORMAL LOW (ref 34.8–46.6)
HGB: 10.9 g/dL — ABNORMAL LOW (ref 11.6–15.9)
LYMPH%: 51.1 % — ABNORMAL HIGH (ref 14.0–49.7)
MCH: 30.5 pg (ref 25.1–34.0)
MCHC: 33.2 g/dL (ref 31.5–36.0)
MCV: 91.9 fL (ref 79.5–101.0)
MONO#: 0.2 10e3/uL (ref 0.1–0.9)
MONO%: 8.3 % (ref 0.0–14.0)
NEUT#: 1 10e3/uL — ABNORMAL LOW (ref 1.5–6.5)
NEUT%: 37.7 % — ABNORMAL LOW (ref 38.4–76.8)
Platelets: 147 10e3/uL (ref 145–400)
RBC: 3.57 10e6/uL — ABNORMAL LOW (ref 3.70–5.45)
RDW: 16.4 % — ABNORMAL HIGH (ref 11.2–14.5)
WBC: 2.8 10e3/uL — ABNORMAL LOW (ref 3.9–10.3)
lymph#: 1.4 10e3/uL (ref 0.9–3.3)
nRBC: 1 % — ABNORMAL HIGH (ref 0–0)

## 2010-05-11 NOTE — Progress Notes (Signed)
Summary: order request  Phone Note Call from Patient Call back at Home Phone [phone]   Caller: Daughter [place] Gren Call For: Alfreddie Consalvo Summary of Call: requests orders for [facility] to come to pt's home. daughter says pt's ins. will cover 20 visits.  Initial call taken by: Tivis Ringer, CNA,  May 04, 2010 10:16 AM  Follow-up for Phone Call        Spoke with pt daughter and she is requesting an order be sent to [facility] for an extension on pt home nursing care.  Please advise if ok to place order.Carron Curie CMA  May 04, 2010 10:48 AM   Additional Follow-up for Phone Call Additional follow up Details #1::        per SN----ok to order [facility] for home care nursing per familys request.  this has been placed in computer for pt...called and spoke with pts daughter [place] Sink and she is aware that order has been sent Randell Loop CMA  May 04, 2010 11:14 AM

## 2010-05-11 NOTE — Letter (Signed)
Summary: [facility]  [facility]   Imported By: Sherian Rein 05/03/2010 14:58:23  _____________________________________________________________________  External Attachment:    Type:   Image     Comment:   External Document

## 2010-05-18 ENCOUNTER — Encounter (HOSPITAL_BASED_OUTPATIENT_CLINIC_OR_DEPARTMENT_OTHER): Payer: PRIVATE HEALTH INSURANCE | Admitting: Hematology and Oncology

## 2010-05-18 ENCOUNTER — Other Ambulatory Visit: Payer: Self-pay | Admitting: Hematology and Oncology

## 2010-05-18 DIAGNOSIS — Z5111 Encounter for antineoplastic chemotherapy: Secondary | ICD-10-CM

## 2010-05-18 DIAGNOSIS — C259 Malignant neoplasm of pancreas, unspecified: Secondary | ICD-10-CM

## 2010-05-18 LAB — CBC WITH DIFFERENTIAL/PLATELET
BASO%: 0.3 % (ref 0.0–2.0)
EOS%: 2.8 % (ref 0.0–7.0)
HCT: 31.8 % — ABNORMAL LOW (ref 34.8–46.6)
LYMPH%: 16.2 % (ref 14.0–49.7)
MCH: 30.4 pg (ref 25.1–34.0)
MCHC: 32.7 g/dL (ref 31.5–36.0)
MONO%: 13.6 % (ref 0.0–14.0)
NEUT%: 67.1 % (ref 38.4–76.8)
Platelets: 351 10*3/uL (ref 145–400)

## 2010-05-18 LAB — COMPREHENSIVE METABOLIC PANEL
ALT: 18 U/L (ref 0–35)
AST: 17 U/L (ref 0–37)
Alkaline Phosphatase: 80 U/L (ref 39–117)
Creatinine, Ser: 0.83 mg/dL (ref 0.40–1.20)
Total Bilirubin: 0.4 mg/dL (ref 0.3–1.2)

## 2010-05-19 ENCOUNTER — Encounter: Payer: Self-pay | Admitting: Pulmonary Disease

## 2010-05-24 LAB — COMPREHENSIVE METABOLIC PANEL
ALT: 101 U/L — ABNORMAL HIGH (ref 0–35)
ALT: 102 U/L — ABNORMAL HIGH (ref 0–35)
ALT: 106 U/L — ABNORMAL HIGH (ref 0–35)
ALT: 66 U/L — ABNORMAL HIGH (ref 0–35)
ALT: 68 U/L — ABNORMAL HIGH (ref 0–35)
ALT: 74 U/L — ABNORMAL HIGH (ref 0–35)
AST: 108 U/L — ABNORMAL HIGH (ref 0–37)
AST: 51 U/L — ABNORMAL HIGH (ref 0–37)
AST: 53 U/L — ABNORMAL HIGH (ref 0–37)
AST: 68 U/L — ABNORMAL HIGH (ref 0–37)
AST: 80 U/L — ABNORMAL HIGH (ref 0–37)
AST: 83 U/L — ABNORMAL HIGH (ref 0–37)
AST: 91 U/L — ABNORMAL HIGH (ref 0–37)
Albumin: 2 g/dL — ABNORMAL LOW (ref 3.5–5.2)
Albumin: 2.1 g/dL — ABNORMAL LOW (ref 3.5–5.2)
Albumin: 2.2 g/dL — ABNORMAL LOW (ref 3.5–5.2)
Albumin: 2.3 g/dL — ABNORMAL LOW (ref 3.5–5.2)
Albumin: 2.4 g/dL — ABNORMAL LOW (ref 3.5–5.2)
Albumin: 2.6 g/dL — ABNORMAL LOW (ref 3.5–5.2)
Alkaline Phosphatase: 315 U/L — ABNORMAL HIGH (ref 39–117)
Alkaline Phosphatase: 360 U/L — ABNORMAL HIGH (ref 39–117)
BUN: 5 mg/dL — ABNORMAL LOW (ref 6–23)
BUN: 5 mg/dL — ABNORMAL LOW (ref 6–23)
BUN: 8 mg/dL (ref 6–23)
CO2: 23 mEq/L (ref 19–32)
CO2: 23 mEq/L (ref 19–32)
CO2: 24 mEq/L (ref 19–32)
CO2: 24 mEq/L (ref 19–32)
CO2: 24 mEq/L (ref 19–32)
CO2: 25 mEq/L (ref 19–32)
CO2: 26 mEq/L (ref 19–32)
CO2: 26 mEq/L (ref 19–32)
Calcium: 8.1 mg/dL — ABNORMAL LOW (ref 8.4–10.5)
Calcium: 8.6 mg/dL (ref 8.4–10.5)
Calcium: 8.8 mg/dL (ref 8.4–10.5)
Calcium: 9 mg/dL (ref 8.4–10.5)
Calcium: 9.3 mg/dL (ref 8.4–10.5)
Chloride: 100 mEq/L (ref 96–112)
Chloride: 101 mEq/L (ref 96–112)
Chloride: 107 mEq/L (ref 96–112)
Chloride: 109 mEq/L (ref 96–112)
Chloride: 94 mEq/L — ABNORMAL LOW (ref 96–112)
Chloride: 94 mEq/L — ABNORMAL LOW (ref 96–112)
Chloride: 95 mEq/L — ABNORMAL LOW (ref 96–112)
Creatinine, Ser: 0.46 mg/dL (ref 0.4–1.2)
Creatinine, Ser: 0.6 mg/dL (ref 0.4–1.2)
Creatinine, Ser: 0.61 mg/dL (ref 0.4–1.2)
Creatinine, Ser: 0.66 mg/dL (ref 0.4–1.2)
Creatinine, Ser: 0.66 mg/dL (ref 0.4–1.2)
Creatinine, Ser: 0.69 mg/dL (ref 0.4–1.2)
GFR calc Af Amer: >60 mL/min (ref >60)
GFR calc Af Amer: >60 mL/min (ref >60)
GFR calc Af Amer: >60 mL/min (ref >60)
GFR calc Af Amer: >60 mL/min (ref >60)
GFR calc Af Amer: >60 mL/min (ref >60)
GFR calc Af Amer: >60 mL/min (ref >60)
GFR calc Af Amer: >60 mL/min (ref >60)
GFR calc Af Amer: >60 mL/min (ref >60)
GFR calc non Af Amer: >60 mL/min (ref >60)
GFR calc non Af Amer: >60 mL/min (ref >60)
GFR calc non Af Amer: >60 mL/min (ref >60)
GFR calc non Af Amer: >60 mL/min (ref >60)
GFR calc non Af Amer: >60 mL/min (ref >60)
GFR calc non Af Amer: >60 mL/min (ref >60)
GFR calc non Af Amer: >60 mL/min (ref >60)
Glucose, Bld: 123 mg/dL — ABNORMAL HIGH (ref 70–99)
Glucose, Bld: 131 mg/dL — ABNORMAL HIGH (ref 70–99)
Glucose, Bld: 157 mg/dL — ABNORMAL HIGH (ref 70–99)
Potassium: 3.6 mEq/L (ref 3.5–5.1)
Potassium: 4 mEq/L (ref 3.5–5.1)
Potassium: 4.2 mEq/L (ref 3.5–5.1)
Potassium: 5.9 mEq/L — ABNORMAL HIGH (ref 3.5–5.1)
Sodium: 131 mEq/L — ABNORMAL LOW (ref 135–145)
Sodium: 131 mEq/L — ABNORMAL LOW (ref 135–145)
Sodium: 134 mEq/L — ABNORMAL LOW (ref 135–145)
Sodium: 135 mEq/L (ref 135–145)
Sodium: 139 mEq/L (ref 135–145)
Total Bilirubin: 5 mg/dL — ABNORMAL HIGH (ref 0.3–1.2)
Total Bilirubin: 5.8 mg/dL — ABNORMAL HIGH (ref 0.3–1.2)
Total Bilirubin: 6.5 mg/dL — ABNORMAL HIGH (ref 0.3–1.2)
Total Bilirubin: 6.5 mg/dL — ABNORMAL HIGH (ref 0.3–1.2)
Total Bilirubin: 6.7 mg/dL — ABNORMAL HIGH (ref 0.3–1.2)
Total Bilirubin: 8.8 mg/dL — ABNORMAL HIGH (ref 0.3–1.2)
Total Bilirubin: 9.5 mg/dL — ABNORMAL HIGH (ref 0.3–1.2)
Total Protein: 5.8 g/dL — ABNORMAL LOW (ref 6.0–8.3)
Total Protein: 5.9 g/dL — ABNORMAL LOW (ref 6.0–8.3)
Total Protein: 6.1 g/dL (ref 6.0–8.3)
Total Protein: 6.8 g/dL (ref 6.0–8.3)

## 2010-05-24 LAB — GLUCOSE, CAPILLARY
Glucose-Capillary: 102 mg/dL — ABNORMAL HIGH (ref 70–99)
Glucose-Capillary: 103 mg/dL — ABNORMAL HIGH (ref 70–99)
Glucose-Capillary: 107 mg/dL — ABNORMAL HIGH (ref 70–99)
Glucose-Capillary: 107 mg/dL — ABNORMAL HIGH (ref 70–99)
Glucose-Capillary: 111 mg/dL — ABNORMAL HIGH (ref 70–99)
Glucose-Capillary: 118 mg/dL — ABNORMAL HIGH (ref 70–99)
Glucose-Capillary: 122 mg/dL — ABNORMAL HIGH (ref 70–99)
Glucose-Capillary: 131 mg/dL — ABNORMAL HIGH (ref 70–99)
Glucose-Capillary: 133 mg/dL — ABNORMAL HIGH (ref 70–99)
Glucose-Capillary: 136 mg/dL — ABNORMAL HIGH (ref 70–99)
Glucose-Capillary: 139 mg/dL — ABNORMAL HIGH (ref 70–99)
Glucose-Capillary: 141 mg/dL — ABNORMAL HIGH (ref 70–99)
Glucose-Capillary: 142 mg/dL — ABNORMAL HIGH (ref 70–99)
Glucose-Capillary: 143 mg/dL — ABNORMAL HIGH (ref 70–99)
Glucose-Capillary: 160 mg/dL — ABNORMAL HIGH (ref 70–99)
Glucose-Capillary: 167 mg/dL — ABNORMAL HIGH (ref 70–99)
Glucose-Capillary: 173 mg/dL — ABNORMAL HIGH (ref 70–99)
Glucose-Capillary: 81 mg/dL (ref 70–99)
Glucose-Capillary: 90 mg/dL (ref 70–99)
Glucose-Capillary: 95 mg/dL (ref 70–99)

## 2010-05-24 LAB — URINALYSIS, ROUTINE W REFLEX MICROSCOPIC
Ketones, ur: NEGATIVE mg/dL
Nitrite: NEGATIVE
Protein, ur: NEGATIVE mg/dL
Urobilinogen, UA: 0.2 mg/dL (ref 0.0–1.0)

## 2010-05-24 LAB — ANAEROBIC CULTURE

## 2010-05-24 LAB — CBC
HCT: 28.9 % — ABNORMAL LOW (ref 36.0–46.0)
HCT: 32.6 % — ABNORMAL LOW (ref 36.0–46.0)
Hemoglobin: 10 g/dL — ABNORMAL LOW (ref 12.0–15.0)
Hemoglobin: 10.1 g/dL — ABNORMAL LOW (ref 12.0–15.0)
Hemoglobin: 10.2 g/dL — ABNORMAL LOW (ref 12.0–15.0)
Hemoglobin: 10.6 g/dL — ABNORMAL LOW (ref 12.0–15.0)
MCH: 31 pg (ref 26.0–34.0)
MCHC: 33.7 g/dL (ref 30.0–36.0)
MCHC: 34.9 g/dL (ref 30.0–36.0)
MCHC: 35.1 g/dL (ref 30.0–36.0)
MCV: 87.8 fL (ref 78.0–100.0)
Platelets: 348 10*3/uL (ref 150–400)
Platelets: 371 10*3/uL (ref 150–400)
RBC: 3.21 MIL/uL — ABNORMAL LOW (ref 3.87–5.11)
RBC: 3.29 MIL/uL — ABNORMAL LOW (ref 3.87–5.11)
RDW: 15.7 % — ABNORMAL HIGH (ref 11.5–15.5)
RDW: 15.9 % — ABNORMAL HIGH (ref 11.5–15.5)
WBC: 11.7 10*3/uL — ABNORMAL HIGH (ref 4.0–10.5)

## 2010-05-24 LAB — APTT: aPTT: 44 seconds — ABNORMAL HIGH (ref 24–37)

## 2010-05-24 LAB — URINE CULTURE
Colony Count: NO GROWTH
Culture  Setup Time: 201112020044

## 2010-05-24 LAB — BODY FLUID CULTURE: Culture: NO GROWTH

## 2010-05-25 ENCOUNTER — Other Ambulatory Visit: Payer: Self-pay | Admitting: Hematology and Oncology

## 2010-05-25 ENCOUNTER — Encounter (HOSPITAL_BASED_OUTPATIENT_CLINIC_OR_DEPARTMENT_OTHER): Payer: PRIVATE HEALTH INSURANCE | Admitting: Hematology and Oncology

## 2010-05-25 DIAGNOSIS — Z452 Encounter for adjustment and management of vascular access device: Secondary | ICD-10-CM

## 2010-05-25 DIAGNOSIS — Z5111 Encounter for antineoplastic chemotherapy: Secondary | ICD-10-CM

## 2010-05-25 DIAGNOSIS — C259 Malignant neoplasm of pancreas, unspecified: Secondary | ICD-10-CM

## 2010-05-25 LAB — PROTIME-INR
INR: 1.17 (ref 0.00–1.49)
Prothrombin Time: 15.1 seconds (ref 11.6–15.2)

## 2010-05-25 LAB — URINALYSIS, ROUTINE W REFLEX MICROSCOPIC
Glucose, UA: NEGATIVE mg/dL
Protein, ur: 30 mg/dL — AB
Specific Gravity, Urine: 1.039 — ABNORMAL HIGH (ref 1.005–1.030)
Urobilinogen, UA: 1 mg/dL (ref 0.0–1.0)

## 2010-05-25 LAB — CBC
MCH: 31.3 pg (ref 26.0–34.0)
MCH: 32.3 pg (ref 26.0–34.0)
MCH: 32.5 pg (ref 26.0–34.0)
MCHC: 34 g/dL (ref 30.0–36.0)
MCHC: 34.1 g/dL (ref 30.0–36.0)
MCHC: 35.3 g/dL (ref 30.0–36.0)
MCV: 95.4 fL (ref 78.0–100.0)
Platelets: 290 10*3/uL (ref 150–400)
Platelets: 316 10*3/uL (ref 150–400)
Platelets: 329 10*3/uL (ref 150–400)
RBC: 3.32 MIL/uL — ABNORMAL LOW (ref 3.87–5.11)
RBC: 3.71 MIL/uL — ABNORMAL LOW (ref 3.87–5.11)
RDW: 15.7 % — ABNORMAL HIGH (ref 11.5–15.5)

## 2010-05-25 LAB — HEPATIC FUNCTION PANEL
Alkaline Phosphatase: 443 U/L — ABNORMAL HIGH (ref 39–117)
Indirect Bilirubin: 3.7 mg/dL — ABNORMAL HIGH (ref 0.3–0.9)
Total Protein: 6.4 g/dL (ref 6.0–8.3)

## 2010-05-25 LAB — CBC WITH DIFFERENTIAL/PLATELET
BASO%: 2.2 % — ABNORMAL HIGH (ref 0.0–2.0)
EOS%: 1.2 % (ref 0.0–7.0)
MCH: 30 pg (ref 25.1–34.0)
MCHC: 33.1 g/dL (ref 31.5–36.0)
MCV: 90.6 fL (ref 79.5–101.0)
MONO%: 13.6 % (ref 0.0–14.0)
NEUT%: 59.3 % (ref 38.4–76.8)
RDW: 18.5 % — ABNORMAL HIGH (ref 11.2–14.5)
lymph#: 1 10*3/uL (ref 0.9–3.3)

## 2010-05-25 LAB — COMPREHENSIVE METABOLIC PANEL
AST: 69 U/L — ABNORMAL HIGH (ref 0–37)
Albumin: 2.1 g/dL — ABNORMAL LOW (ref 3.5–5.2)
Albumin: 2.8 g/dL — ABNORMAL LOW (ref 3.5–5.2)
BUN: 11 mg/dL (ref 6–23)
CO2: 23 mEq/L (ref 19–32)
Calcium: 8.5 mg/dL (ref 8.4–10.5)
Calcium: 9.2 mg/dL (ref 8.4–10.5)
Chloride: 99 mEq/L (ref 96–112)
Creatinine, Ser: 0.73 mg/dL (ref 0.4–1.2)
Creatinine, Ser: 0.96 mg/dL (ref 0.4–1.2)
GFR calc Af Amer: >60 mL/min (ref >60)
GFR calc non Af Amer: 55 mL/min — ABNORMAL LOW (ref >60)
Total Bilirubin: 12.1 mg/dL — ABNORMAL HIGH (ref 0.3–1.2)
Total Protein: 5.7 g/dL — ABNORMAL LOW (ref 6.0–8.3)

## 2010-05-25 LAB — GLUCOSE, CAPILLARY
Glucose-Capillary: 111 mg/dL — ABNORMAL HIGH (ref 70–99)
Glucose-Capillary: 118 mg/dL — ABNORMAL HIGH (ref 70–99)
Glucose-Capillary: 136 mg/dL — ABNORMAL HIGH (ref 70–99)
Glucose-Capillary: 156 mg/dL — ABNORMAL HIGH (ref 70–99)
Glucose-Capillary: 177 mg/dL — ABNORMAL HIGH (ref 70–99)

## 2010-05-25 LAB — CULTURE, BLOOD (ROUTINE X 2): Culture: NO GROWTH

## 2010-05-25 LAB — URINE MICROSCOPIC-ADD ON

## 2010-05-25 LAB — BASIC METABOLIC PANEL
BUN: 12 mg/dL (ref 6–23)
CO2: 25 mEq/L (ref 19–32)
Calcium: 9 mg/dL (ref 8.4–10.5)
Creatinine, Ser: 0.88 mg/dL (ref 0.4–1.2)
Glucose, Bld: 122 mg/dL — ABNORMAL HIGH (ref 70–99)

## 2010-05-25 LAB — LIPASE, BLOOD: Lipase: 67 U/L — ABNORMAL HIGH (ref 11–59)

## 2010-05-25 LAB — DIFFERENTIAL
Basophils Absolute: 0 10*3/uL (ref 0.0–0.1)
Lymphocytes Relative: 3 % — ABNORMAL LOW (ref 12–46)
Neutro Abs: 12.9 10*3/uL — ABNORMAL HIGH (ref 1.7–7.7)

## 2010-05-25 LAB — MAGNESIUM: Magnesium: 2.4 mg/dL (ref 1.5–2.5)

## 2010-05-25 NOTE — Letter (Signed)
Summary:  [facility]  [facility]   Imported By: Sherian Rein 05/18/2010 08:39:20  _____________________________________________________________________  External Attachment:    Type:   Image     Comment:   External Document

## 2010-06-01 ENCOUNTER — Encounter (HOSPITAL_BASED_OUTPATIENT_CLINIC_OR_DEPARTMENT_OTHER): Payer: PRIVATE HEALTH INSURANCE | Admitting: Hematology and Oncology

## 2010-06-01 ENCOUNTER — Other Ambulatory Visit: Payer: Self-pay | Admitting: Hematology and Oncology

## 2010-06-01 DIAGNOSIS — Z452 Encounter for adjustment and management of vascular access device: Secondary | ICD-10-CM

## 2010-06-01 DIAGNOSIS — C259 Malignant neoplasm of pancreas, unspecified: Secondary | ICD-10-CM

## 2010-06-01 DIAGNOSIS — Z5111 Encounter for antineoplastic chemotherapy: Secondary | ICD-10-CM

## 2010-06-01 LAB — CBC WITH DIFFERENTIAL/PLATELET
BASO%: 1.6 % (ref 0.0–2.0)
Eosinophils Absolute: 0.1 10*3/uL (ref 0.0–0.5)
LYMPH%: 38 % (ref 14.0–49.7)
MCHC: 33.6 g/dL (ref 31.5–36.0)
MCV: 91.3 fL (ref 79.5–101.0)
MONO%: 10.4 % (ref 0.0–14.0)
NEUT#: 1.5 10*3/uL (ref 1.5–6.5)
RBC: 3.2 10*6/uL — ABNORMAL LOW (ref 3.70–5.45)
RDW: 19.3 % — ABNORMAL HIGH (ref 11.2–14.5)
WBC: 3.1 10*3/uL — ABNORMAL LOW (ref 3.9–10.3)

## 2010-06-01 NOTE — Miscellaneous (Signed)
Summary: Order/[facility]  Order/[facility]   Imported By: Sherian Rein 05/25/2010 07:50:43  _____________________________________________________________________  External Attachment:    Type:   Image     Comment:   External Document

## 2010-06-05 ENCOUNTER — Other Ambulatory Visit: Payer: Self-pay | Admitting: Pulmonary Disease

## 2010-06-08 ENCOUNTER — Encounter (HOSPITAL_BASED_OUTPATIENT_CLINIC_OR_DEPARTMENT_OTHER): Payer: PRIVATE HEALTH INSURANCE | Admitting: Hematology and Oncology

## 2010-06-08 ENCOUNTER — Telehealth: Payer: Self-pay | Admitting: Pulmonary Disease

## 2010-06-08 ENCOUNTER — Other Ambulatory Visit: Payer: Self-pay | Admitting: Hematology and Oncology

## 2010-06-08 DIAGNOSIS — D638 Anemia in other chronic diseases classified elsewhere: Secondary | ICD-10-CM

## 2010-06-08 DIAGNOSIS — C9000 Multiple myeloma not having achieved remission: Secondary | ICD-10-CM

## 2010-06-08 DIAGNOSIS — Z5111 Encounter for antineoplastic chemotherapy: Secondary | ICD-10-CM

## 2010-06-08 DIAGNOSIS — M069 Rheumatoid arthritis, unspecified: Secondary | ICD-10-CM

## 2010-06-08 DIAGNOSIS — Z452 Encounter for adjustment and management of vascular access device: Secondary | ICD-10-CM

## 2010-06-08 DIAGNOSIS — C259 Malignant neoplasm of pancreas, unspecified: Secondary | ICD-10-CM

## 2010-06-08 LAB — COMPREHENSIVE METABOLIC PANEL
ALT: 35 U/L (ref 0–35)
Albumin: 3.5 g/dL (ref 3.5–5.2)
CO2: 23 mEq/L (ref 19–32)
Chloride: 109 mEq/L (ref 96–112)
Glucose, Bld: 93 mg/dL (ref 70–99)
Potassium: 3.7 mEq/L (ref 3.5–5.3)
Sodium: 141 mEq/L (ref 135–145)
Total Bilirubin: 0.4 mg/dL (ref 0.3–1.2)
Total Protein: 6.1 g/dL (ref 6.0–8.3)

## 2010-06-08 LAB — CBC WITH DIFFERENTIAL/PLATELET
BASO%: 0.9 % (ref 0.0–2.0)
Eosinophils Absolute: 0.1 10*3/uL (ref 0.0–0.5)
MCHC: 32.9 g/dL (ref 31.5–36.0)
MONO#: 0.4 10*3/uL (ref 0.1–0.9)
NEUT#: 1.9 10*3/uL (ref 1.5–6.5)
Platelets: 121 10*3/uL — ABNORMAL LOW (ref 145–400)
RBC: 3.07 10*6/uL — ABNORMAL LOW (ref 3.70–5.45)
RDW: 20.4 % — ABNORMAL HIGH (ref 11.2–14.5)
WBC: 3.3 10*3/uL — ABNORMAL LOW (ref 3.9–10.3)
lymph#: 1 10*3/uL (ref 0.9–3.3)

## 2010-06-08 NOTE — Telephone Encounter (Signed)
lmomtcb x1  Mindy Silva, CMA  

## 2010-06-09 LAB — IRON AND TIBC
Iron: 45 ug/dL (ref 42–145)
UIBC: 143 ug/dL

## 2010-06-09 LAB — FERRITIN: Ferritin: 678 ng/mL — ABNORMAL HIGH (ref 10–291)

## 2010-06-09 NOTE — Telephone Encounter (Signed)
Daughter Cub[place] Sink returned call from triage. [phone]. Tivis Ringer

## 2010-06-09 NOTE — Telephone Encounter (Signed)
Called and spoke with pts daughter and she is aware that rx for the walker with a seat has been placed in the mail to her home address, ok for the iron injections but daughter stated that they called her today and stated that her iron level went back up and no further injections are needed---and for the procera  SN recs that she cont with the aricept and can try the procera if pt wants to.  Daughter voiced her understanding of this and will call for any further concerns

## 2010-06-09 NOTE — Telephone Encounter (Signed)
1) Pt's daughter calling to let SN know pt is getting monthly iron injections through July 2012 at oncology. 2) She has already purchased a rolling walker with seat but would like to have RX for this in hopes that ins will reimburse her for it.  3) She would also like to know if Procera would be an appropriate option for pt instead of Aricept.  Pls advise.    NKDA.

## 2010-06-11 ENCOUNTER — Telehealth: Payer: Self-pay | Admitting: Pulmonary Disease

## 2010-06-11 NOTE — Telephone Encounter (Signed)
lmtcb x1 

## 2010-06-14 NOTE — Telephone Encounter (Signed)
LMTCB x2  

## 2010-06-15 NOTE — Telephone Encounter (Signed)
lmtcb x3 

## 2010-06-16 ENCOUNTER — Telehealth: Payer: Self-pay | Admitting: Pulmonary Disease

## 2010-06-16 MED ORDER — MEGESTROL ACETATE 20 MG PO TABS: 20.0000 mg | ORAL_TABLET | Freq: Every day | ORAL | Status: DC

## 2010-06-16 MED ORDER — PANTOPRAZOLE SODIUM 20 MG PO TBEC: 20.0000 mg | DELAYED_RELEASE_TABLET | Freq: Every day | ORAL | Status: DC

## 2010-06-16 MED ORDER — PREDNISONE 5 MG PO TABS: 5.0000 mg | ORAL_TABLET | Freq: Every day | ORAL | Status: DC

## 2010-06-16 MED ORDER — WALKER MISC: Status: DC

## 2010-06-16 MED ORDER — DONEPEZIL HCL 5 MG PO TABS: 5.0000 mg | ORAL_TABLET | Freq: Every day | ORAL | Status: DC

## 2010-06-16 NOTE — Telephone Encounter (Signed)
Per daughter, [place] Sink, 31 day supply will be needed for Prednisone, Aricept and Protonix.. New RX will be called to [facility] on [address] per daughters request.

## 2010-06-16 NOTE — Telephone Encounter (Signed)
Pt has DEGENERATIVE JOINT DISEASE (ICD-715.90) & BACK PAIN, LUMBAR (ICD-724.2) - w/ hx spinal stenosis and SERONEGATIVE RHEUMATOID ARTHRITIS (ICD-714.0) - with debilatation with multiple medical problems  Please send new rx for walker. With above.

## 2010-06-16 NOTE — Telephone Encounter (Signed)
Pt's daughter wanting to know if SN will okay for [place] to send new RX on her meds for 90 day supply. This way she can fill med boxes for 3 months in advance. Pls advise.

## 2010-06-16 NOTE — Telephone Encounter (Signed)
Printed new rx and was put on Dr. Jodelle Green cart for him to sign   Carver Fila, CMA

## 2010-06-16 NOTE — Telephone Encounter (Signed)
Pt's daughter states that she did receive RX for the walker but it needs to specify why the pt needs it so that insurance will reimburse her for it. I will forward to Leigh to have SN redo RX with diagnosis and mail to pt's home, which was verified.

## 2010-06-16 NOTE — Telephone Encounter (Signed)
Per SN----ok to send in new rx on her meds for 90 day supply.  thanks

## 2010-06-16 NOTE — Telephone Encounter (Signed)
rx has been fixed and placed in the mail to the pts home per pts request.

## 2010-06-18 ENCOUNTER — Telehealth: Payer: Self-pay | Admitting: Internal Medicine

## 2010-06-18 NOTE — Telephone Encounter (Signed)
Feet swelling better overnight but has trouble walking due to foot pain x one week bilateral equal.  Rec elevate higher than heart over weekend   Will let triage know she needs to be seen first of the week of April 9 if no better and to er if worse or sob in meantime

## 2010-06-19 ENCOUNTER — Telehealth: Payer: Self-pay | Admitting: Emergency Medicine

## 2010-06-19 NOTE — Telephone Encounter (Signed)
Daughter called to say pt accidentally took too much pred 4/6. She apparently took 10mg  instead of 5mg . Wants to know if it is ok to resume the 5mg  qd. I told her to do exactly that, to call us if any problems.

## 2010-06-20 LAB — CBC
HCT: 35.8 % — ABNORMAL LOW (ref 36.0–46.0)
Hemoglobin: 12.3 g/dL (ref 12.0–15.0)
MCV: 96.7 fL (ref 78.0–100.0)
Platelets: 190 10*3/uL (ref 150–400)
RDW: 15.6 % — ABNORMAL HIGH (ref 11.5–15.5)

## 2010-06-20 LAB — BONE MARROW EXAM

## 2010-06-20 LAB — APTT: aPTT: 26 seconds (ref 24–37)

## 2010-06-21 NOTE — Telephone Encounter (Signed)
Spoke w/ pt daughter and she states the pt is feet is some better today and pt wants to wait and see how her feet does today. I advised pt daughter to call back if feet swelling gets worse so pt can be worked in to be seen this week. Daughter advised understanding

## 2010-06-22 LAB — URINALYSIS, ROUTINE W REFLEX MICROSCOPIC
Glucose, UA: NEGATIVE mg/dL
Protein, ur: NEGATIVE mg/dL
Specific Gravity, Urine: 1.013 (ref 1.005–1.030)
Urobilinogen, UA: 1 mg/dL (ref 0.0–1.0)

## 2010-06-22 LAB — COMPREHENSIVE METABOLIC PANEL
ALT: 17 U/L (ref 0–35)
AST: 23 U/L (ref 0–37)
Albumin: 3.6 g/dL (ref 3.5–5.2)
Alkaline Phosphatase: 98 U/L (ref 39–117)
Calcium: 9.8 mg/dL (ref 8.4–10.5)
GFR calc Af Amer: >60 mL/min (ref >60)
Potassium: 4 mEq/L (ref 3.5–5.1)
Sodium: 138 mEq/L (ref 135–145)
Total Protein: 6.6 g/dL (ref 6.0–8.3)

## 2010-06-22 LAB — DIFFERENTIAL
Eosinophils Absolute: 0.2 10*3/uL (ref 0.0–0.7)
Eosinophils Relative: 3 % (ref 0–5)
Lymphs Abs: 1 10*3/uL (ref 0.7–4.0)
Monocytes Absolute: 0.5 10*3/uL (ref 0.1–1.0)
Monocytes Relative: 9 % (ref 3–12)

## 2010-06-22 LAB — URINE MICROSCOPIC-ADD ON

## 2010-06-22 LAB — CBC
MCHC: 34.3 g/dL (ref 30.0–36.0)
RDW: 14.2 % (ref 11.5–15.5)

## 2010-06-28 ENCOUNTER — Other Ambulatory Visit: Payer: Self-pay | Admitting: Pulmonary Disease

## 2010-07-01 ENCOUNTER — Telehealth: Payer: Self-pay | Admitting: Pulmonary Disease

## 2010-07-01 NOTE — Telephone Encounter (Signed)
Spoke with [place] Sink and notified the recs per SN.  She states no need to fax the records, she will come and sign form here and pick up forms from med records in the basement.  Nothing further needed.

## 2010-07-01 NOTE — Telephone Encounter (Signed)
Will check with SN about diagnosis

## 2010-07-01 NOTE — Telephone Encounter (Signed)
Called and spoke with pts daughter and she stated that the RN from [facility] came out today--this nurse is trying to get the pt all the help that she needs and explained to the daughter that if pt has dementia then they will need proof of this and a diagnosis  Of this in order to get pt more help.  The daughter stated that she was only aware that the aricept that pt is taking was as a preventative for memory loss.  Please advise. thanks

## 2010-07-01 NOTE — Telephone Encounter (Signed)
Per SN---pt does have diagnosis of arteriosclerotic  Dementia that comes from age and hardening of the arteries.  We can fax a copy of pts last ov note to RN from [facility], but the pt will need to come to the office to sign a form giving [place] permission to send the papers.  thanks

## 2010-07-07 ENCOUNTER — Encounter (HOSPITAL_BASED_OUTPATIENT_CLINIC_OR_DEPARTMENT_OTHER): Payer: PRIVATE HEALTH INSURANCE | Admitting: Hematology and Oncology

## 2010-07-07 ENCOUNTER — Other Ambulatory Visit: Payer: Self-pay | Admitting: Hematology and Oncology

## 2010-07-07 DIAGNOSIS — C259 Malignant neoplasm of pancreas, unspecified: Secondary | ICD-10-CM

## 2010-07-07 DIAGNOSIS — Z5111 Encounter for antineoplastic chemotherapy: Secondary | ICD-10-CM

## 2010-07-07 DIAGNOSIS — C250 Malignant neoplasm of head of pancreas: Secondary | ICD-10-CM

## 2010-07-07 LAB — COMPREHENSIVE METABOLIC PANEL
AST: 50 U/L — ABNORMAL HIGH (ref 0–37)
BUN: 15 mg/dL (ref 6–23)
CO2: 20 mEq/L (ref 19–32)
Calcium: 9 mg/dL (ref 8.4–10.5)
Chloride: 109 mEq/L (ref 96–112)
Creatinine, Ser: 0.92 mg/dL (ref 0.40–1.20)

## 2010-07-07 LAB — CBC WITH DIFFERENTIAL/PLATELET
BASO%: 0.6 % (ref 0.0–2.0)
HCT: 36.8 % (ref 34.8–46.6)
LYMPH%: 18.9 % (ref 14.0–49.7)
MCHC: 32.3 g/dL (ref 31.5–36.0)
MCV: 94.8 fL (ref 79.5–101.0)
MONO#: 0.6 10*3/uL (ref 0.1–0.9)
MONO%: 8.8 % (ref 0.0–14.0)
NEUT%: 67.2 % (ref 38.4–76.8)
Platelets: 206 10*3/uL (ref 145–400)
WBC: 6.7 10*3/uL (ref 3.9–10.3)

## 2010-07-20 ENCOUNTER — Telehealth: Payer: Self-pay

## 2010-07-20 NOTE — Telephone Encounter (Signed)
Called, spoke with pt.  She was informed of SN's recs and verbalized understanding of this. 

## 2010-07-20 NOTE — Telephone Encounter (Signed)
Pls advise if there any reason the pt cannot or should not take an antioxident with CoQ -10 or green tea extract.

## 2010-07-20 NOTE — Telephone Encounter (Signed)
Per SN---yes this is ok to use.  A very good idea. thanks

## 2010-07-21 ENCOUNTER — Telehealth: Payer: Self-pay | Admitting: Pulmonary Disease

## 2010-07-21 NOTE — Telephone Encounter (Signed)
lmomtcb x1 

## 2010-07-23 ENCOUNTER — Ambulatory Visit: Payer: PRIVATE HEALTH INSURANCE | Admitting: Pulmonary Disease

## 2010-07-23 ENCOUNTER — Ambulatory Visit: Payer: PRIVATE HEALTH INSURANCE | Admitting: Adult Health

## 2010-07-23 ENCOUNTER — Ambulatory Visit (INDEPENDENT_AMBULATORY_CARE_PROVIDER_SITE_OTHER): Payer: PRIVATE HEALTH INSURANCE | Admitting: Pulmonary Disease

## 2010-07-23 ENCOUNTER — Encounter: Payer: Self-pay | Admitting: Pulmonary Disease

## 2010-07-23 VITALS — BP 110/62 | HR 61 | Temp 97.0°F | Ht 64.0 in | Wt 162.6 lb

## 2010-07-23 DIAGNOSIS — C9000 Multiple myeloma not having achieved remission: Secondary | ICD-10-CM

## 2010-07-23 DIAGNOSIS — H6590 Unspecified nonsuppurative otitis media, unspecified ear: Secondary | ICD-10-CM

## 2010-07-23 DIAGNOSIS — M949 Disorder of cartilage, unspecified: Secondary | ICD-10-CM

## 2010-07-23 DIAGNOSIS — M899 Disorder of bone, unspecified: Secondary | ICD-10-CM

## 2010-07-23 DIAGNOSIS — F411 Generalized anxiety disorder: Secondary | ICD-10-CM

## 2010-07-23 DIAGNOSIS — M1990 Unspecified osteoarthritis, unspecified site: Secondary | ICD-10-CM

## 2010-07-23 DIAGNOSIS — F0150 Vascular dementia without behavioral disturbance: Secondary | ICD-10-CM

## 2010-07-23 DIAGNOSIS — M069 Rheumatoid arthritis, unspecified: Secondary | ICD-10-CM

## 2010-07-23 DIAGNOSIS — C259 Malignant neoplasm of pancreas, unspecified: Secondary | ICD-10-CM

## 2010-07-23 DIAGNOSIS — J8410 Pulmonary fibrosis, unspecified: Secondary | ICD-10-CM

## 2010-07-23 MED ORDER — PREDNISONE (PAK) 5 MG PO TABS: ORAL_TABLET | ORAL | Status: DC

## 2010-07-23 MED ORDER — NEOMYCIN-POLYMYXIN-HC 1 % OT SOLN: 3.0000 [drp] | Freq: Three times a day (TID) | OTIC | Status: AC

## 2010-07-23 MED ORDER — AMOXICILLIN-POT CLAVULANATE 875-125 MG PO TABS: 1.0000 | ORAL_TABLET | Freq: Two times a day (BID) | ORAL | Status: AC

## 2010-07-23 NOTE — Telephone Encounter (Signed)
Pt's daughter shaely gadberry called back again.

## 2010-07-23 NOTE — Telephone Encounter (Signed)
Spoke to [place] the daughter pt has appt to see dr [email or web address]  ent 07/27/10@2 :15pm

## 2010-07-23 NOTE — Patient Instructions (Signed)
Today we updated your med list in EPIC...  For your ear infection> otitis media>    Start the Prednisone Dosepak as directed...    Take the Augmentin antibiotic twice daily til gone...    Add the Cortisporin ear drops> 3 drops in right ear three times daily..  We will arrange for an ENT evaluation to see if you need a tube in the ear to drain the fluid behind the ear drum...  Call for any questions.Marland KitchenMarland Kitchen

## 2010-07-23 NOTE — Telephone Encounter (Signed)
Spoke with pt's daughter, [place] Sink.  States she is supposed to be speaking to [place] re: ent referral for pt.  Requesting Libby call her back.  Almyra Free, I see an ENT referral in -- have you tried to contact pt's daughter about this?  Pls advise.  Thanks!

## 2010-07-24 ENCOUNTER — Encounter: Payer: Self-pay | Admitting: Pulmonary Disease

## 2010-07-24 NOTE — Progress Notes (Signed)
Subjective:    Patient ID: Heather Dorsey, female    DOB: 1923-05-11, 77 y.o.   MRN: [id number]  HPI 77 y/o BF here for a follow up visit... she has multiple medical problems as noted below... Followed for general medical purposes w/ hx of seroneg RA & underlying pulm fibrosis, DJD & LBP, Osteopenia, mild dementia, and Monoclonal Gammopathy/ smoldering myeloma per DrOdogwu... also followed by [facility] for Rheum, and DrLucey for Ortho >> she was hosp 11/11 w/ jaundice & abd discomfort w/ dx of Pancreatic cancer...  ~  September 17, 2009:  she had f/u DrOdogwu 6/11- stable myeloma markers w/ Q32mo observation recommended... breathing is OK- denies cough, sputum, hemoptysis, ch in dyspnea, etc (last CXR 1/11 showed decr LVs & incr markings)... Pred 5mg  at 1.5 tab Qam, & MTX stable at 6 tabs per week... generally stable> continue same meds and follow ups.  ~  December 04, 2009:  Add-on appt at request of home health nurse for low BP 72/54> pt admits to being "a little tired" but overall feels well- denies weakness, postural symptoms, dizzy, syncope, falls, etc... she is not on any vasoactive meds & doesn't have hx of HBP, no recent URI, gastroenteritis, or urinary symptoms... she has DJD & seroneg RA followed by Rheum... BP by nurse here= 118/68 & I check 120/70 w/o postural changes... we decided to check routine labs to be sure all is OK...  ~  March 05, 2010:  she was hosp 11/11 w/ abd discomfort & jaundice> diagnosed w/ pancreatic cancer w/ stent placed in common bile duct >> she was transfered to [facility] (their notes are pending)...  ~  Jul 23, 2010:  75mo ROV & add-on for right ear symptoms>  She has been followed for her pancreatic cancer by DrOdogwu here & DrO'Neil at [facility], daughter mentions that they have stopped her chemoRx (last chemo given 3/12);  She went to [facility] for hearing eval & the technician saw something in her right ear & was told to come right away> they note decr hearing in right ear  comes & goes, itching & Rx w/ olive oil on Qtip, they deny nasal problems;  Exam shows right OM w/ effusion ==> rec Augmentin, Dosepak, Cortisporin, & refer to ENT incase tube needed...  Lab data in EPIC reviewed from March-April, last Oncology note we have is from 2/12 & reviewed w/ daughter...         Problem List:  ALLERGIC RHINITIS (ICD-477.9) - we discussed OTC meds avail to her for her allergy symptoms- Claritin, Zyrtek, nasal saline, etc...  BRONCHITIS, RECURRENT (ICD-491.9) - no recent change in cough, no sputum, fever, etc...  PULMONARY FIBROSIS (ICD-515) - on PREDNISONE 7.5mg /d (5mg tabs- 1.5/d),  NEB w/ Albut Prn, & GFN (she refuses Pulmicort as she says it made her sleepwalk and fall)...   ~  baseline CXR w/ incr markings, NAD... ~  prev PFT's w/ mod restrictive dis...  s/p participation in pulm rehab program...  ~  CTChest in 1999 showed mild fibrosis, some bronchiectasis in LL's... ~  last admitted 3/07 w/ AB exac superimposed on her underlying stable pulm fibrosis.Marland Kitchen. ~  f/u CXR 5/10 = mild basilar reticular interstitial dis/ scarring, no change. ~  f/u CXR 1/11 showed sl decr LVs & incr markings (?sl progression), tort Ao, etc... ~  f/u CXR 11/11 showed incr markings, no acute change... ~  CT Chest 11/11 showed incr markings at bases, 5mm RLL nodule noted, no adenopathy, & dilated hepatic ducts==> pancreatic  cancer diagnosed.  ADENOCARCINOMA, PANCREAS (ICD-157.9)   <<SEE ABOVE>> new diagnosis 11/11 ~  HOSP DCSummary 02/22/10 w/ Tx to [facility] reviewed... ~  subseq follow up & management by DrOdogwu in [place] & DrONeil at [facility]...  DEGENERATIVE JOINT DISEASE (ICD-715.90) & BACK PAIN, LUMBAR (ICD-724.2) - w/ hx spinal stenosis and prev eval by DrWillis... daughter prev requested outpt physical therapy w/ hot pack and stimulation therapy... she takes Glucosamine/ Chondroitin daily... notes right knee pain & needs ortho eval... saw DrLucey, then DrDeveshwar- w/ Dx of seroneg  RA.  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 she switched to [facility] & was changed to Pred, MTX, & Folic Acid... ~  May10: right hand pain/ swelling worse since she stopped cortisone therapy per DrD... we re-started Medrol 8mg /d w/ improvement. ~  labs here 5/10 showed Chem-OK, CBC-12.4, Uric-4.3, Sed-18... ~  6/10 saw DrAnderson w/ change in regimen to PRED 5mg tabs- 3/d now, MTX 2.5mg - 6tabs/wk, Folic 1mg /d... ~  7/11: she is currently taking Pred5mg - 1.5tabs + MTX 6tabs/wk...  OSTEOPENIA (ICD-733.90) - she takes Calcium supplement, MVI, Vit D 1000 u daily & Glucosamine... ~  BMD in 2003 was OK w/ TScores > -1...  ~  f/u BMD 11/09 showed TScores +1.5 in Spine, & -0.9 in right FemNeck... rec- continue calcium/ vits. ~  Vit D level 11/09 = 33... encouraged to continue OTC Vit D supplements. ~  labs 5/10 showed Vit D level = 39... rec> 1000 u daily.  ARTERIOSCLEROTIC DEMENTIA (ICD-290.40) - new prob per family in 2010... w/ MMSE showing 0/3 objects at 2 min after distraction... on ASA 81mg /d & ARICEPT 5mg /d started- they feel that this is helping... ~  7/10:  reviewed med Rx & very important visitng nurse help w/ meds. ~  7/11:  they are content to continue the Aricept at 5mg  dose.  Hx of PARESTHESIA (ICD-782.0) - prev eval by neuro in [place] in 2006 was neg...she takes MVI daily.  ANXIETY (ICD-300.00)  MULTIPLE  MYELOMA (ICD-203.00) - labs showing low IgM level & referred to Heme/Onc w/ eval DrOdogwu showing a "smoldering mult myeloma"- SPE did not show an MSpike, but IEP showed a monoclonal IgA lambda protein (light chains were neg).Marland KitchenMarland KitchenBone marrow showed hypercellular marrow w/ 20% plasma cells... decision made for observational protocol... ~  6/11:  [facility]> myeloma markers are stable & Q62mo observation to continue. ~  12/11:  NOTE> she was  Dx w/ pancreatic cancer last month...  She had a PNEUMOVAX in 2007.   No past surgical history on file.   Outpatient Encounter Prescriptions as of 07/23/2010  Medication Sig Dispense Refill  . donepezil (ARICEPT) 5 MG tablet Take 1 tablet (5 mg total) by mouth at bedtime.  90 tablet  1  . megestrol (MEGACE) 20 MG tablet Take 1 tablet (20 mg total) by mouth daily.  90 tablet  1  . pantoprazole (PROTONIX) 20 MG tablet Take 1 tablet (20 mg total) by mouth daily.  90 tablet  1  . polyethylene glycol powder (MIRALAX) powder Take 17 g by mouth daily.        . predniSONE (DELTASONE) 5 MG tablet TAKE 1 TABLETS EVERY DAY  60 tablet  2  . amoxicillin-clavulanate (AUGMENTIN) 875-125 MG per tablet Take 1 tablet by mouth 2 (two) times daily.  14 tablet  0  . NEOMYCIN-POLYMYXIN-HC, OTIC, (CORTISPORIN) 1 % SOLN Place 3 drops into the  right ear 3 (three) times daily.  1 Bottle  0  . predniSONE, Pak, (STERAPRED) 5 MG TABS Please give a 6 day pack--take as directed  1 each  0    No Known Allergies   Review of Systems        See HPI - all other systems neg except as noted... The patient complains of anorexia, weight loss, decreased hearing, dyspnea on exertion, abdominal pain, muscle weakness, and difficulty walking.  The patient denies fever, weight gain, vision loss, hoarseness, chest pain, syncope, peripheral edema, prolonged cough, headaches, hemoptysis, melena, hematochezia, severe indigestion/heartburn, hematuria, incontinence, suspicious skin lesions, transient blindness, depression, unusual weight change, abnormal bleeding, enlarged lymph nodes, and angioedema.   Objective:   Physical Exam     WD, WN, 77 y/o BF in NAD... weak & she has obviously lost weight. Vital signs:  Reviewed... GENERAL:  Alert, pleasant & cooperative... HEENT:  [place]/AT, EOM-wnl, PERRLA, EACs-clear, TMs- right TM red w/ effusion, NOSE-clear, THROAT-clear & wnl. NECK:  Supple w/ fairROM; no JVD; normal carotid impulses w/o  bruits; no thyromegaly or nodules palpated; no lymphadenopathy. CHEST:  velcro rales about 1/2 way up, no wheezing, no rhonchi, no signs of consolidation... HEART:  Regular Rhythm; without murmurs/ rubs/ or gallops heard... ABDOMEN:  Soft & mild epig tender on palp; normal bowel sounds; no organomegaly or masses detected. EXT:  mild arthritic changes in hands; +arthritic changes in right knee & shoulder; no varicose veins, +venous insuffic, no edema. NEURO:  CN's intact; no focal neuro deficits... DERM:  No lesions noted; no rash etc...   Assessment & Plan:   OTITIS MEDIA w/ EFFUSION right ear> discussed unusual nature for this in adults;  Rx w/ Augmentin, Pred Dosepak, Cortisporin Otic drops;  Refer to ENT in case tube is needed to drain...  Pancreatic Cancer>  Followed & managed by drOdogwu in [place] & DrONeil at [facility];  Daughter asked to remind the specialists to keep me in the loop w/ notes sent...  Mult Myeloma>  Also followed by DrOdogwu & labs in [facility] are reviewed....  PULM FIBROSIS>  Clinically stable on her low dose 5mg  Pred daily... Continue same for now in light of her other more serious medical issues...  DJD>  She is followed by Elita Quick; in wheelchair today w/ mult issues...  Dementia>  She is holding her own according to the daughter,  They want to continue the lower dose of Aricept as is.Marland KitchenMarland Kitchen

## 2010-07-30 NOTE — Consult Note (Signed)
[facility]  Patient:    Heather Dorsey, Heather Dorsey                        MRN: [id number] Adm. Date:  [id number] Disc. Date: [id number] Attending:  Devoria Albe CC:         Lonzo Cloud. Kriste Basque, M.D. [facility]   Consultation Report  HISTORY OF PRESENT ILLNESS:  Heather Dorsey is a 77 year old Afro-American female who was brought to the emergency room with asthmatic bronchitis with an asthma flare over the last three days.  She has had a diagnosis of asthma for eight years and has been on prednisone for at least three years, most recently a dose of 10 mg daily.  Over the last three days, she has had progressive shortness of breath in the context of light-brown sputum.  She has been using her metered-dose inhaler on a regular basis.  She denies any fever, chills or sweats.  She has been placed on Avelox and has several days left.  She is also on guaifenesin and Uniphyl.  Her past medical history includes a hysterectomy for a benign lesion.  She had a fracture of her left upper extremity.  She has had three pregnancies and one delivery.  She denies any drug allergies.  She does not smoke or drink.  She was treated aggressively by Dr. Ward Givens, the emergency room physician, with Solu-Medrol 125 mg IV and magnesium sulfate 2 g IV.  She was also given theophylline 200 mg IV over 30 minutes, as her theophylline level was 7.4.  She received nebulized treatments with albuterol x 2 and Atrovent x 1.  Peak expiratory flow rate initially was 270 and climbed to 340.  Patient states she has had a peak flow meter in the past but does not utilize it.  At this time, she states she is much improved and is expressing a desire to go home.  PHYSICAL EXAMINATION  VITAL SIGNS:  Blood pressure is 124/70, pulse 83 and regular, respiratory rate 24.  Initially, her temperature was 99.4; it was not rechecked.  HEENT:  There is no scleral icterus or jaundice noted.  She has complete dentures.   The right tympanic membrane is erythematous but she denies any otic symptoms.  LYMPHATICS:  She has no lymphadenopathy about the head, neck or axillae.  LUNGS:  There are mild rhonchi and wheezing at the bases.  She is resting quietly on the gurney with no increased work of breathing.  CARDIAC:  She has an S4.  ABDOMEN:  Abdomen was protuberant without organomegaly or masses or tenderness.  On review of systems, she had no GI or GU symptoms.  GENITORECTAL:  Exam was initially deferred as it is not germane to this admission.  EXTREMITIES:  Homans sign is negative.  Dorsalis pedis pulses are slightly decreased but present.  There was a suggestion of some early clubbing of the nail beds.  There is no cyanosis.  LABORATORY AND X-RAY FINDINGS:  O2 saturations on room air are 96%.  BMET revealed a glucose of 120 but was otherwise normal.  Chest x-rays reveal chronic bronchitis with no hyperinflation or pneumonia.  As stated, theophylline level was slightly reduced at 7.4.  WBC was 7000, hematocrit 38.2.  Her neutrophil count was 80, which is mildly elevated.  RECOMMENDATION:  It is recommended that she continue the Avelox; this possibly may increase the theophylline level.  Her Uniphyl was not increased, as she had received the  IV theophylline.  It was recommended she take prednisone 20 mg as two 10 mg pills twice a day for five days and then 10 mg twice a day until see by Dr. Lonzo Cloud. Nadel in five to six days.  She was to use her nebulizer with albuterol and Atrovent every four hours as needed.  She was told that she would have to be admitted should she fail to continue to improve and she was agreeable to this and at some point in fact, she laughed hardily, delighted that she was going to be able to go home. DD:  04/15/00 TD:  04/17/00 Job: [id number]

## 2010-07-30 NOTE — H&P (Signed)
NAMECARYS, Heather Dorsey                 ACCOUNT NO.:  [id number]   MEDICAL RECORD NO.:  [id number]          PATIENT TYPE:  INP   LOCATION:  3730                         FACILITY:  [facility]   PHYSICIAN:  Clinton D. Maple Hudson, M.D. DATE OF BIRTH:  1923-12-14   DATE OF ADMISSION:  06/03/2005  DATE OF DISCHARGE:                                HISTORY & PHYSICAL   ADMITTING DIAGNOSIS:  Acute exacerbation of asthmatic bronchitis.   HISTORY:  A 77 year old African-American woman who presented through the  emergency room with chief complaint of shortness of breath and asthma.  She  lives alone at home where she has a home nebulizer and is on daily  maintenance prednisone at an unknown dose.  She last saw Dr. Kriste Basque in  January.  She feels she has had a cold over the last few days.  She began  coughing harder 24 hours prior to admission with cough progressively  productive of yellow sputum.  Not aware of fever or chills.  Substernal pain  with cough but no nausea.  She tried treating herself actively with  Robitussin and Tussionex and, on presentation, was drowsy and mildly  confused, responding to Narcan.  She had diffuse wheezing on presentation  which has not cleared adequately with nebulized medications and initiation  of antibiotics, Rocephin/Zithromax.  I was asked to admit.   REVIEW OF SYSTEMS:  She was mildly drowsy but awoke easily to voice and full  orientation.  She complained primarily of chest congestion, wheeze, and mild  shortness of breath with awareness of substernal cough-related soreness.  Yellow sputum.  No blood.  No high fever or chills.  No nausea or vomiting.  No change in bowel or bladder.  No palpitation, syncope, leg pain, or ankle  edema.   PAST MEDICAL HISTORY:  1.  Chronic asthmatic bronchitis, steroid dependent.  2.  Emergency room evaluation for a similar problem in February of 2002,      able to return home at that time.  3.  Emergency room evaluation March of 2006 for  abdominal pain.  4.  She denies pneumonia, tuberculosis, diabetes, heart disease, or history      of cancer or blood clotting disorder.  5.  Surgery limited to hysterectomy.  6.  Previous fractured arm.  7.  Three pregnancies with one childbirth.   SOCIAL HISTORY:  Not married.  Lives alone.  Quit smoking many years ago.  No alcohol.   HOME MEDICATIONS:  1.  Guaifenesin.  2.  Prednisone.  3.  Theo-24.  4.  Advair (?).  5.  Home nebulizer with unknown medication.  6.  Tussionex.  7.  Robitussin-DM.   No medication allergy.   FAMILY HISTORY:  Unaware of anybody with respiratory disease.   PHYSICAL EXAMINATION:  VITAL SIGNS:  Temperature 101.7, BP 128/82, pulse  regular 86, sinus rhythm by monitor, oxygen saturation variable 87 to 94% on  room air.  GENERAL APPEARANCE:  Obese.  Drowsy but awaking to fully alert and oriented.  Able to move all extremities.  Speaking in short sentences.  SKIN:  No  rash.  ADENOPATHY:  None found.  HEENT:  Oral mucosa clear.  No neck vein distention.  Pupils equal.  No  stridor.  CHEST:  Diffuse congested, wheezy rhonchi.  Congested nonproductive cough.  Not using accessory muscles.  Heart sounds regular without murmur or gallop.  Breasts:  No dominate mass.  HEART:  Regular rhythm.  Normal S1 and S2.  No murmur or gallop heard.  ABDOMEN:  Very obese, soft, nontender.  No palpable hepatosplenomegaly.  No  audible bruit.  Bowel sounds are present.  GENITALIA/RECTAL:  Not examined.  Not pertinent.  No complaint.  EXTREMITIES:  No cyanosis, clubbing, edema, or cords.   Chest x-ray:  Peribronchial thickening.  No obvious infiltrate.  ABG on room  air shows pH 7.43, pCO2 of 38, pO2 of 75, bicarbonate 25.  WBC 6,000,  hemoglobin 12.4, eosinophils 3%.   IMPRESSION:  Acute asthmatic bronchitis with exacerbation, probable  underlying fixed component of obstructive airways disease/COPD, chronically  steroid dependent.  Exogenous obesity.  Description  from ER presentation  suggests that she had oversedated herself with Tussionex responsive to  Narcan.   PLAN:  Intravenous steroids, nebulized bronchodilators, Rocephin and  Zithromax, IV fluids, admission for medical stabilization.  I will provide  benzonatate perles tonight for cough.  She is likely to be glucose sensitive  to steroid therapy although she does not offer a history of diabetes so I  will provide sliding-scale CBG coverage.  She is admitted to the service of  Dr. Kriste Basque.      Clinton D. Maple Hudson, M.D.  Electronically Signed     CDY/MEDQ  D:  06/03/2005  T:  06/03/2005  Job:  [id number]   cc:   Lonzo Cloud. Kriste Basque, M.D. [facility]  [address]  [place]  [place] [address]

## 2010-07-30 NOTE — Discharge Summary (Signed)
Heather Dorsey, Heather Dorsey                 ACCOUNT NO.:  [id number]   MEDICAL RECORD NO.:  [id number]          PATIENT TYPE:  INP   LOCATION:  5154                         FACILITY:  [facility]   PHYSICIAN:  Lonzo Cloud. Kriste Basque, M.D. [facility] OF BIRTH:  June 16, 1923   DATE OF ADMISSION:  06/03/2005  DATE OF DISCHARGE:  06/07/2005                                 DISCHARGE SUMMARY   FINAL DIAGNOSES:  1.  Admitted to June 02, 2005 with chronic obstructive pulmonary disease      exacerbation and asthmatic bronchitis:  The patient responded to broad-      spectrum antibiotic coverage with Rocephin, Zithromax and IV Solu-      Medrol.  Being discharged on oral Zithromax and Medrol.  2.  History of underlying pulmonary fibrosis for many years which is stable.      Pulmonary function studies previously showed moderate restriction.  She      is in a pulmonary rehab program.  3.  History of allergic rhinitis on Claritin.  4.  History of degenerative arthritis and low back pain with spinal      stenosis, previously evaluated by Dr. Anne Hahn.  5.  History of paresthesias in the legs with negative evaluations in [place] and by neurology here.  6.  Status post hysterectomy and appendectomy.   BRIEF HISTORY AND PHYSICAL:  The patient is a 77 year old black female known  to me from office follow-up.  She has a history of asthmatic bronchitis on  home nebulizer.  She developed a recent upper respiratory infection with  progressive cough, yellow sputum production, dyspnea and post tussive chest  discomfort.  She presented the emergency room with these symptoms and failed  to clear with nebulizer treatment.  She was referred for admission and seen  by Dr. Maple Hudson in my absence.  There was a question of her taking too much  Robitussin and Tussionex and was somewhat drowsy and nauseated in the ER.   PAST MEDICAL HISTORY:  As noted, she has a history of asthmatic bronchitis  with similar exacerbations in the past.  She  also has some mild underlying  pulmonary fibrosis which she had been stable for several years.  Her chest x-  ray has shown normal heart size and increased markings chronically.  She had  a CT of the chest in 1999 that showed mild fibrosis and some bronchiectasis  in the lower lobes.  She had pulmonary function studies previously that  showed FEC 1.60 liters (58% predicted); FEV-1 1.46 liters (69% predicted);  an FEV-1/FEC ratio of 91%.  She has been in the pulmonary rehab program and  gotten significant benefit there from.  She has a history of mild allergic  rhinitis and uses Claritin as needed.  She has a history of some  degenerative arthritis with low back pain and spinal stenosis previous  evaluated by Dr. Anne Hahn.  She had a normal bone density in 2003.  She has a  history of paresthesias in her legs with evaluations in [place] in 2006 and  by the  neurologist here, both evaluations were negative.  She has had  previous hysterectomy and appendectomy.   PHYSICAL EXAMINATION:  Physical examination at the time of admission  revealed an elderly black female in no acute distress.  Blood pressure  128/82, pulse 86 and regular, respirations 22 and not labored, O2 sat 94% on  room air, temperature 101.7 in the ER.  HEENT exam unremarkable.  She was  somewhat sleepy but was awake and alert on arousal.  Chest revealed diffuse  wheezing and rhonchi with a congested cough.  No signs of consolidation.  Cardiac exam revealed a regular rate and rhythm.  No murmur.  Abdominal exam  was soft and nontender without evidence organomegaly or masses.  Extremities  showed some mild venous insufficiency, trace edema, degenerative arthritis,  no acute abnormalities.  Neurologic exam was intact except for mild  somnolence.  No focal abnormalities.   LABORATORY DATA:  EKG showed normal sinus rhythm, rare PAC.  There were  nonspecific ST-T wave changes.  No acute abnormalities.  Chest x-ray showed  slight  increase in interstitial markings without change from old films.  No  active disease.  Sputum cultures grew just normal throat flora.  Hemoglobin  12.4, hematocrit 37.7, white count 6000 with 83% segs.  Sodium 134,  potassium 3.8, chloride 101, CO2 25, BUN 12, creatinine 0.9, blood sugar 91.  Pro time 15.2, INR 1.2, PTT 38 seconds.  SGOT 78, SGPT 28, alk phos 83,  total bilirubin 1.3, albumin 3.1, calcium 9.0.  Urinalysis clear.  Theophylline level 5.6 on admission.  Blood cultures negative x2.   HOSPITAL COURSE:  The patient was admitted by Dr. Maple Hudson in my absence.  She  was started on IV Solu-Medrol, IV Rocephin and Zithromax.  She was given  nebulizer treatments, Tessalon Perles for cough, continued on Mucinex to aid  in mucociliary clearance.  She responded nicely to this regimen with slow  steady improvement.  We are able to wean the IV Solu-Medrol in favor of oral  prednisone and able to wean the IV antibiotics in favor of oral Zithromax.  She was wheeze-free, cleared and ready for discharge on June 07, 2005.   MEDICATIONS AT DISCHARGE:  1.  Zithromax 250 mg p.o. q.d. until gone.  2.  Medrol 4 mg tablets 2 tablets p.o. q.a.m. until return.  3.  Nebulizer treatments with a triple combo q.i.d. as before.  4.  __________ 24 300 mg p.o. q.d.  5.  Guaifenesin 600 mg p.o. b.i.d. with plenty of fluids.  6.  Enteric-coated aspirin 81 mg p.o. q.d.  7.  Tylenol as needed.  8.  Laxative of choice as needed.   DISPOSITION:  The patient is being discharged home.  Will follow up in the  office on Friday June 10, 2005 at 12 noon.   CONDITION ON DISCHARGE:  Improved.      Lonzo Cloud. Kriste Basque, M.D. [facility]  Electronically Signed     SMN/MEDQ  D:  06/07/2005  T:  06/08/2005  Job:  [id number]   cc:   Patient's chart

## 2010-08-05 ENCOUNTER — Other Ambulatory Visit: Payer: Self-pay | Admitting: Hematology and Oncology

## 2010-08-05 ENCOUNTER — Encounter (HOSPITAL_BASED_OUTPATIENT_CLINIC_OR_DEPARTMENT_OTHER): Payer: PRIVATE HEALTH INSURANCE | Admitting: Hematology and Oncology

## 2010-08-05 DIAGNOSIS — C250 Malignant neoplasm of head of pancreas: Secondary | ICD-10-CM

## 2010-08-05 DIAGNOSIS — D638 Anemia in other chronic diseases classified elsewhere: Secondary | ICD-10-CM

## 2010-08-05 DIAGNOSIS — M069 Rheumatoid arthritis, unspecified: Secondary | ICD-10-CM

## 2010-08-05 DIAGNOSIS — C259 Malignant neoplasm of pancreas, unspecified: Secondary | ICD-10-CM

## 2010-08-05 LAB — CBC WITH DIFFERENTIAL/PLATELET
Eosinophils Absolute: 0.1 10*3/uL (ref 0.0–0.5)
MCV: 95.3 fL (ref 79.5–101.0)
MONO#: 0.6 10*3/uL (ref 0.1–0.9)
MONO%: 9 % (ref 0.0–14.0)
NEUT#: 4.9 10*3/uL (ref 1.5–6.5)
RBC: 4.06 10*6/uL (ref 3.70–5.45)
RDW: 16.7 % — ABNORMAL HIGH (ref 11.2–14.5)
WBC: 6.6 10*3/uL (ref 3.9–10.3)

## 2010-08-05 LAB — COMPREHENSIVE METABOLIC PANEL
AST: 18 U/L (ref 0–37)
Albumin: 3.6 g/dL (ref 3.5–5.2)
Alkaline Phosphatase: 84 U/L (ref 39–117)
Glucose, Bld: 112 mg/dL — ABNORMAL HIGH (ref 70–99)
Potassium: 3.6 mEq/L (ref 3.5–5.3)
Sodium: 143 mEq/L (ref 135–145)
Total Protein: 5.9 g/dL — ABNORMAL LOW (ref 6.0–8.3)

## 2010-09-28 ENCOUNTER — Other Ambulatory Visit: Payer: Self-pay | Admitting: Hematology and Oncology

## 2010-09-28 ENCOUNTER — Encounter (HOSPITAL_BASED_OUTPATIENT_CLINIC_OR_DEPARTMENT_OTHER): Payer: PRIVATE HEALTH INSURANCE | Admitting: Hematology and Oncology

## 2010-09-28 DIAGNOSIS — Z452 Encounter for adjustment and management of vascular access device: Secondary | ICD-10-CM

## 2010-09-28 DIAGNOSIS — C250 Malignant neoplasm of head of pancreas: Secondary | ICD-10-CM

## 2010-09-28 LAB — CBC WITH DIFFERENTIAL/PLATELET
BASO%: 0.2 % (ref 0.0–2.0)
EOS%: 1.9 % (ref 0.0–7.0)
MCH: 30.2 pg (ref 25.1–34.0)
MCHC: 33.4 g/dL (ref 31.5–36.0)
MCV: 90.5 fL (ref 79.5–101.0)
MONO%: 8.4 % (ref 0.0–14.0)
NEUT#: 5.2 10*3/uL (ref 1.5–6.5)
RBC: 4.21 10*6/uL (ref 3.70–5.45)
RDW: 15.9 % — ABNORMAL HIGH (ref 11.2–14.5)

## 2010-09-28 LAB — COMPREHENSIVE METABOLIC PANEL
AST: 18 U/L (ref 0–37)
Albumin: 3.1 g/dL — ABNORMAL LOW (ref 3.5–5.2)
Alkaline Phosphatase: 86 U/L (ref 39–117)
Potassium: 3.6 mEq/L (ref 3.5–5.3)
Sodium: 139 mEq/L (ref 135–145)
Total Protein: 7 g/dL (ref 6.0–8.3)

## 2010-10-14 ENCOUNTER — Other Ambulatory Visit: Payer: Self-pay | Admitting: Hematology and Oncology

## 2010-10-14 ENCOUNTER — Encounter (HOSPITAL_BASED_OUTPATIENT_CLINIC_OR_DEPARTMENT_OTHER): Payer: PRIVATE HEALTH INSURANCE | Admitting: Hematology and Oncology

## 2010-10-14 ENCOUNTER — Telehealth: Payer: Self-pay | Admitting: Pulmonary Disease

## 2010-10-14 DIAGNOSIS — D649 Anemia, unspecified: Secondary | ICD-10-CM

## 2010-10-14 DIAGNOSIS — C250 Malignant neoplasm of head of pancreas: Secondary | ICD-10-CM

## 2010-10-14 DIAGNOSIS — C9000 Multiple myeloma not having achieved remission: Secondary | ICD-10-CM

## 2010-10-14 LAB — CBC WITH DIFFERENTIAL/PLATELET
Basophils Absolute: 0 10*3/uL (ref 0.0–0.1)
EOS%: 1.9 % (ref 0.0–7.0)
LYMPH%: 12.1 % — ABNORMAL LOW (ref 14.0–49.7)
MCH: 30.2 pg (ref 25.1–34.0)
MCV: 89.9 fL (ref 79.5–101.0)
MONO%: 7.6 % (ref 0.0–14.0)
Platelets: 182 10*3/uL (ref 145–400)
RBC: 3.95 10*6/uL (ref 3.70–5.45)
RDW: 16.3 % — ABNORMAL HIGH (ref 11.2–14.5)

## 2010-10-14 NOTE — Telephone Encounter (Signed)
[email or web address]  

## 2010-10-15 ENCOUNTER — Telehealth: Payer: Self-pay | Admitting: Pulmonary Disease

## 2010-10-15 NOTE — Telephone Encounter (Signed)
Daughter [place] Sink called me back and stated that labs were done at cancer center---pt has appt with SN on 8-16 at 10:30.  Explained to the daughter that we can discuss these issues at the pts appt with SN and get any other necessary labs at that time.  pts daughter voiced her understanding.

## 2010-10-15 NOTE — Telephone Encounter (Signed)
Called and spoke with rita---pts daughter stated that she will call back due to pts caregiver is there with pt.

## 2010-10-15 NOTE — Telephone Encounter (Signed)
Called to speak with [place] Sink.  i explained to her that we will discuss this at pts next ov on 8-16.  Spoke with Ms. Min and she will have [place] call me back.

## 2010-10-18 ENCOUNTER — Telehealth: Payer: Self-pay | Admitting: Pulmonary Disease

## 2010-10-18 NOTE — Telephone Encounter (Signed)
Duplicate message.  See message from 10-18-10

## 2010-10-18 NOTE — Telephone Encounter (Signed)
Called and spoke with pts daughter [place] Sink and she is aware that we will wait  Until the ov with SN on 8-16 at 10:30 to decide if pt will need neurology appt.   Daughter stated that pt is confused at times but thinks this comes from the radiation tx she is taking.  Daughter is aware that this can be lengthy process about getting appt with neuro.  That we have to fax ov notes to the [facility] office and they will schedule the appt.

## 2010-10-28 ENCOUNTER — Ambulatory Visit (INDEPENDENT_AMBULATORY_CARE_PROVIDER_SITE_OTHER): Payer: PRIVATE HEALTH INSURANCE | Admitting: Pulmonary Disease

## 2010-10-28 ENCOUNTER — Ambulatory Visit (INDEPENDENT_AMBULATORY_CARE_PROVIDER_SITE_OTHER)
Admission: RE | Admit: 2010-10-28 | Discharge: 2010-10-28 | Disposition: A | Discharge status: Home / Self Care | Payer: PRIVATE HEALTH INSURANCE | Source: Ambulatory Visit | Attending: Pulmonary Disease | Admitting: Pulmonary Disease

## 2010-10-28 ENCOUNTER — Encounter: Payer: Self-pay | Admitting: Pulmonary Disease

## 2010-10-28 ENCOUNTER — Other Ambulatory Visit (INDEPENDENT_AMBULATORY_CARE_PROVIDER_SITE_OTHER): Payer: PRIVATE HEALTH INSURANCE

## 2010-10-28 DIAGNOSIS — R0989 Other specified symptoms and signs involving the circulatory and respiratory systems: Secondary | ICD-10-CM

## 2010-10-28 DIAGNOSIS — M949 Disorder of cartilage, unspecified: Secondary | ICD-10-CM

## 2010-10-28 DIAGNOSIS — C259 Malignant neoplasm of pancreas, unspecified: Secondary | ICD-10-CM

## 2010-10-28 DIAGNOSIS — F0150 Vascular dementia without behavioral disturbance: Secondary | ICD-10-CM

## 2010-10-28 DIAGNOSIS — J8410 Pulmonary fibrosis, unspecified: Secondary | ICD-10-CM

## 2010-10-28 DIAGNOSIS — R0600 Dyspnea, unspecified: Secondary | ICD-10-CM

## 2010-10-28 DIAGNOSIS — R5383 Other fatigue: Secondary | ICD-10-CM

## 2010-10-28 DIAGNOSIS — M545 Low back pain, unspecified: Secondary | ICD-10-CM

## 2010-10-28 DIAGNOSIS — M069 Rheumatoid arthritis, unspecified: Secondary | ICD-10-CM

## 2010-10-28 DIAGNOSIS — M1990 Unspecified osteoarthritis, unspecified site: Secondary | ICD-10-CM

## 2010-10-28 DIAGNOSIS — R0609 Other forms of dyspnea: Secondary | ICD-10-CM

## 2010-10-28 DIAGNOSIS — Z79899 Other long term (current) drug therapy: Secondary | ICD-10-CM

## 2010-10-28 DIAGNOSIS — F411 Generalized anxiety disorder: Secondary | ICD-10-CM

## 2010-10-28 DIAGNOSIS — M899 Disorder of bone, unspecified: Secondary | ICD-10-CM

## 2010-10-28 DIAGNOSIS — C9000 Multiple myeloma not having achieved remission: Secondary | ICD-10-CM

## 2010-10-28 LAB — CBC WITH DIFFERENTIAL/PLATELET
Basophils Absolute: 0 10*3/uL (ref 0.0–0.1)
Basophils Relative: 0.1 % (ref 0.0–3.0)
Eosinophils Absolute: 0.1 10*3/uL (ref 0.0–0.7)
MCHC: 32.9 g/dL (ref 30.0–36.0)
MCV: 93 fl (ref 78.0–100.0)
Monocytes Absolute: 0.4 10*3/uL (ref 0.1–1.0)
Neutro Abs: 5.1 10*3/uL (ref 1.4–7.7)
Neutrophils Relative %: 83.2 % — ABNORMAL HIGH (ref 43.0–77.0)
RBC: 3.85 Mil/uL — ABNORMAL LOW (ref 3.87–5.11)
RDW: 16.5 % — ABNORMAL HIGH (ref 11.5–14.6)

## 2010-10-28 LAB — BASIC METABOLIC PANEL
Chloride: 105 mEq/L (ref 96–112)
Creatinine, Ser: 1 mg/dL (ref 0.4–1.2)
Potassium: 4.1 mEq/L (ref 3.5–5.1)
Sodium: 138 mEq/L (ref 135–145)

## 2010-10-28 LAB — HEPATIC FUNCTION PANEL
ALT: 15 U/L (ref 0–35)
AST: 20 U/L (ref 0–37)
Alkaline Phosphatase: 82 U/L (ref 39–117)
Bilirubin, Direct: 0.2 mg/dL (ref 0.0–0.3)
Total Bilirubin: 0.8 mg/dL (ref 0.3–1.2)

## 2010-10-28 MED ORDER — PREDNISONE 5 MG PO TABS: 5.0000 mg | ORAL_TABLET | Freq: Every day | ORAL | Status: AC

## 2010-10-28 MED ORDER — DONEPEZIL HCL 5 MG PO TABS: 5.0000 mg | ORAL_TABLET | Freq: Every day | ORAL | Status: DC

## 2010-10-28 MED ORDER — ALPRAZOLAM 0.25 MG PO TABS: 0.2500 mg | ORAL_TABLET | Freq: Three times a day (TID) | ORAL | Status: AC | PRN

## 2010-10-28 MED ORDER — PANTOPRAZOLE SODIUM 40 MG PO TBEC: DELAYED_RELEASE_TABLET | ORAL | Status: DC

## 2010-10-28 NOTE — Progress Notes (Signed)
Subjective:    Patient ID: Heather Dorsey, female    DOB: 1923/09/23, 77 y.o.   MRN: [id number]  HPI 77 y/o BF here for a follow up visit... she has multiple medical problems as noted below... Followed for general medical purposes w/ hx of seroneg RA & underlying pulm fibrosis, DJD & LBP, Osteopenia, mild dementia, and Monoclonal Gammopathy/ smoldering myeloma per DrOdogwu... also followed by [facility] for Rheum, and DrLucey for Ortho >> NOTE: she was hosp 11/11 w/ jaundice & abd discomfort w/ dx of Pancreatic Cancer...  ~  September 17, 2009:  she had f/u DrOdogwu 6/11- stable myeloma markers w/ Q10mo observation recommended... breathing is OK- denies cough, sputum, hemoptysis, ch in dyspnea, etc (last CXR 1/11 showed decr LVs & incr markings)... Pred 5mg  at 1.5 tab Qam, & MTX stable at 6 tabs per week... generally stable> continue same meds and follow ups.  ~  December 04, 2009:  Add-on appt at request of home health nurse for low BP 72/54> pt admits to being "a little tired" but overall feels well- denies weakness, postural symptoms, dizzy, syncope, falls, etc... she is not on any vasoactive meds & doesn't have hx of HBP, no recent URI, gastroenteritis, or urinary symptoms... she has DJD & seroneg RA followed by Rheum... BP by nurse here= 118/68 & I check 120/70 w/o postural changes... we decided to check routine labs to be sure all is OK...  ~  March 05, 2010:  she was hosp 11/11 w/ abd discomfort & jaundice> diagnosed w/ pancreatic cancer w/ stent placed in common bile duct >> she was transfered to [facility] (their notes are pending)...  ~  Jul 23, 2010:  942mo ROV & add-on for right ear symptoms>  She has been followed for her pancreatic cancer by DrOdogwu here & DrO'Neil at [facility], daughter mentions that they have stopped her chemoRx (last chemo given 3/12);  She went to [facility] for hearing eval & the technician saw something in her right ear & was told to come right away> they note decr hearing in right  ear comes & goes, itching & Rx w/ olive oil on Qtip, they deny nasal problems;  Exam shows ?right OM w/ effusion ==> rec Augmentin, Dosepak, Cortisporin, & refer to ENT in case tube needed (eval by drRosen showed hearing loss & 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  Lab data in EPIC reviewed from March-April, last Oncology note we have is from 2/12 & reviewed w/ daughter...  ~  October 28, 2010:  742mo ROV & she has been followed very closely by DrOdogwu w/ labs every 42mo; she has lost some weight from 163# to 155#, appetite fair, eating ok she says; c/o tired, SOB, sleeping all the time, gas pains in right lower rib area, forgetful> & she has seen both DrOdogwu & DrO'Neil at [facility], and they both suggested f/u w/ her primary for these symptoms;  We discussed her diagnoses, plan to re-eval CXR (no ch in fibrosis, NAD) & blood work (Chems- ok x BS145, CBC- Hg11.8, TSH norm) w/ rec for increased exercise program eg- silver sneakers vs, senior center; & Protonix, Mylicon, Phazyme rx... They request refill Rxs today...         Problem List:  ALLERGIC RHINITIS (ICD-477.9) - we discussed OTC meds avail to her for her allergy symptoms- Claritin, Zyrtek, nasal saline, etc...  BRONCHITIS, RECURRENT (ICD-491.9) - no recent change in cough, no sputum, fever, etc...  PULMONARY FIBROSIS (ICD-515) - on  PREDNISONE 5mg /d now & stable,  NEB w/ Albut Prn, & GFN (she refuses Pulmicort as she says it made her sleepwalk and fall)...   ~  baseline CXR w/ incr markings, NAD... ~  prev PFT's w/ mod restrictive dis...  s/p participation in pulm rehab program...  ~  CTChest in 1999 showed mild fibrosis, some bronchiectasis in LL's... ~  last admitted 3/07 w/ AB exac superimposed on her underlying stable pulm fibrosis.Marland Kitchen. ~  f/u CXR 5/10 = mild basilar reticular interstitial dis/ scarring, no change. ~  f/u CXR 1/11 showed sl decr LVs & incr markings (?sl progression), tort Ao, etc... ~  f/u CXR 11/11 showed incr  markings, no acute change... ~  CT Chest 11/11 showed incr markings at bases, 5mm RLL nodule noted, no adenopathy, & dilated hepatic ducts==> pancreatic cancer diagnosed. ~  CXR 8/12 showed stable incr markings c/w her underlying pulm fibrosis, no change...  ADENOCARCINOMA, PANCREAS (ICD-157.9)   <<SEE ABOVE>> new diagnosis 11/11 ~  HOSP DCSummary 02/22/10 w/ Tx to [facility] reviewed... ~  subseq follow up & management by DrOdogwu in [place] & DrONeil at [facility]... ~  8/12: she reports that when last imaged they could not see the mass & they continue to follow scans every 4-40mo...  DEGENERATIVE JOINT DISEASE (ICD-715.90) & BACK PAIN, LUMBAR (ICD-724.2) - w/ hx spinal stenosis and prev eval by DrWillis... daughter prev requested outpt physical therapy w/ hot pack and stimulation therapy... she takes Glucosamine/ Chondroitin daily & Tylenol as needed... notes right knee pain & needs ortho eval... saw DrLucey, then DrDeveshwar- w/ Dx of seroneg RA >>  SERONEGATIVE RHEUMATOID ARTHRITIS (ICD-714.0) - diagnosed by DrDeveshwar and treated w/ PREDNISONE tapering schedule that she was confused about, & PLAQUENIL 200mg Bid... she switched to [facility] & was changed to Pred, MTX, & Folic Acid... ~  May10: right hand pain/ swelling worse since she stopped cortisone therapy per DrD... we re-started Medrol 8mg /d w/ improvement. ~  labs here 5/10 showed Chem-OK, CBC-12.4, Uric-4.3, Sed-18... ~  6/10 saw DrAnderson w/ change in regimen to PRED 5mg tabs- 3/d now, MTX 2.5mg - 6tabs/wk, Folic 1mg /d... ~  7/11: she is currently taking Pred5mg - 1.5tabs + MTX 6tabs/wk... ~  8/12:  She is currently off the MTX from [facility] (?stopped 11/11), and taking Pred 5mg /d & stable she says...  OSTEOPENIA (ICD-733.90) - she takes Calcium supplement, MVI, Vit D 1000 u daily & Glucosamine... ~  BMD in 2003 was OK w/ TScores > -1...  ~  f/u BMD 11/09 showed TScores +1.5 in Spine, & -0.9 in right FemNeck... rec- continue calcium/ vits. ~   Vit D level 11/09 = 33... encouraged to continue OTC Vit D supplements. ~  labs 5/10 showed Vit D level = 39... rec> 1000 u daily.  ARTERIOSCLEROTIC DEMENTIA (ICD-290.40) - new prob per family in 2010... w/ MMSE showing 0/3 objects at 2 min after distraction... on ASA 81mg /d & ARICEPT 5mg /d started- they feel that this is helping... ~  7/10:  reviewed med Rx & very important visitng nurse help w/ meds. ~  7/11:  they are content to continue the Aricept at 5mg  dose. ~  8/12:  ditto  Hx of PARESTHESIA (ICD-782.0) - prev eval by neuro in [place] in 2006 was neg...she takes MVI daily.  ANXIETY (ICD-300.00)  MULTIPLE  MYELOMA (ICD-203.00) - labs showing low IgM level & referred to Heme/Onc w/ eval DrOdogwu showing a "smoldering mult myeloma"- SPE did not show an MSpike, but IEP showed a monoclonal IgA lambda protein (  light chains were neg).Marland KitchenMarland KitchenBone marrow showed hypercellular marrow w/ 20% plasma cells... decision made for observational protocol... ~  6/11:  note from [facility] reviewed> myeloma markers are stable & Q64mo observation to continue. ~  12/11:  NOTE> she was Dx w/ pancreatic cancer last month... ~  Labs followed by DrOdogwu, her hematologist/ oncologist...  Health Maintenance: ~  GI:   ~  GYN:   ~  Immunizations: she is rec to get the FLu shot yearly;  She had a PNEUMOVAX in 2007;  ?last Tetanus vaccine...   Past Surgical History  Procedure Date  . Appendectomy   . Abdominal hysterectomy   . Common dile duct stent 02/2010    for pancreatic cancer    Outpatient Encounter Prescriptions as of 10/28/2010  Medication Sig Dispense Refill  . acetaminophen (TYLENOL) 325 MG tablet Take 650 mg by mouth every 6 (six) hours as needed.        . ALPRAZolam (XANAX) 0.5 MG tablet Take 0.5 mg by mouth 3 (three) times daily as needed. For anxiety        . aspirin 81 MG tablet Take 81 mg by mouth daily.        . Cholecalciferol (VITAMIN D) 1000 UNITS capsule Take 1,000 Units by mouth daily.         Marland Kitchen donepezil (ARICEPT) 5 MG tablet Take 1 tablet (5 mg total) by mouth at bedtime.  90 tablet  1  . megestrol (MEGACE) 20 MG tablet Take 1 tablet (20 mg total) by mouth daily.  90 tablet  1  . Misc. Devices (WALKER) MISC Rolling walker with seat Dx 715.90 & 714.0  1 each  0  . NEOMYCIN-POLYMYXIN-HC, OTIC, (CORTISPORIN) 1 % SOLN Place 3 drops into the right ear 3 (three) times daily.  1 Bottle  0  . pantoprazole (PROTONIX) 20 MG tablet Take 1 tablet (20 mg total) by mouth daily.  90 tablet  1  . polyethylene glycol powder (MIRALAX) powder Take 17 g by mouth daily.        . predniSONE (DELTASONE) 5 MG tablet TAKE 1 TABLETS EVERY DAY  60 tablet  2  . predniSONE, Pak, (STERAPRED) 5 MG TABS Please give a 6 day pack--take as directed  1 each  0  . simethicone (MYLICON) 125 MG chewable tablet Chew 125 mg by mouth every 6 (six) hours as needed.          No Known Allergies   Current Medications, Allergies, Past Medical History, Past Surgical History, Family History, and Social History were reviewed in [employer] record.    Review of Systems        See HPI - all other systems neg except as noted... The patient complains of anorexia, weight loss, decreased hearing, dyspnea on exertion, abdominal pain, muscle weakness, and difficulty walking.  The patient denies fever, weight gain, vision loss, hoarseness, chest pain, syncope, peripheral edema, prolonged cough, headaches, hemoptysis, melena, hematochezia, severe indigestion/heartburn, hematuria, incontinence, suspicious skin lesions, transient blindness, depression, unusual weight change, abnormal bleeding, enlarged lymph nodes, and angioedema.   Objective:   Physical Exam     WD, WN, 77 y/o BF in NAD... weak & she has obviously lost weight. Vital signs:  Reviewed... GENERAL:  Alert, pleasant & cooperative... HEENT:  [place]/AT, EOM-wnl, PERRLA, EACs-clear, TMs- right TM red w/ effusion, NOSE-clear, THROAT-clear & wnl. NECK:   Supple w/ fairROM; no JVD; normal carotid impulses w/o bruits; no thyromegaly or nodules palpated; no lymphadenopathy. CHEST:  velcro rales about 1/3 way up, no wheezing, no rhonchi, no signs of consolidation... HEART:  Regular Rhythm; without murmurs/ rubs/ or gallops heard... ABDOMEN:  Soft & mild epig tender on palp; normal bowel sounds; no organomegaly or masses detected. EXT:  mild arthritic changes in hands; +arthritic changes in right knee & shoulder; no varicose veins, +venous insuffic, no edema. NEURO:  CN's intact; no focal neuro deficits... DERM:  No lesions noted; no rash etc...   Assessment & Plan:   Dyspnea/ weakness>  Today's CC reviewed w/ the pt & daughter; we discussed the labs etc; rec to incr exercise program, get moving, silver sneakers etc...  Right Ear Problem> prev OM treated & f/u eval by Gayla Medicus showed a benign growth (?vasc tumor) on ear drum, he rec observation...  Pancreatic Cancer>  Followed & managed by drOdogwu in [place] & DrONeil at [facility];  Daughter asked to remind the specialists to keep me in the loop w/ notes sent...  Mult Myeloma>  Also followed by DrOdogwu & labs in [facility] are reviewed....  PULM FIBROSIS>  Clinically stable on her low dose 5mg  Pred daily, CXR stable fibrosis pattern, no change... Continue same for now in light of her other more serious medical issues...  DJD>  She is followed by Elita Quick; in wheelchair today w/ mult issues...  Dementia>  She is holding her own according to the daughter,  They want to continue the lower dose of Aricept as is.Marland KitchenMarland Kitchen

## 2010-10-28 NOTE — Patient Instructions (Signed)
Today we updated your med list in EPIC...    We refilled your meds per request...  For your GAS:    Try the SIMETHACONE (Mylicon) 4 times daily, along w/ PHAZYME tabs 4 times daily...  For the Shortness of breath:    You must get more active to improve your breathing==> consider "silver sneakers" etc...  Today we did your follow up CXR & blood work...    Please call the PHONE TREE in a few days for your results...    Dial [id number] & when prompted enter your patient number followed by the # symbol...    Your patient number is:  [id number]#  Continue your regular follow up visits w/ your cancer specialists here in [place] & at [facility]...  Let's plan a follow up recheck in 2-3 months.Marland KitchenMarland Kitchen

## 2010-10-29 ENCOUNTER — Encounter: Payer: Self-pay | Admitting: Pulmonary Disease

## 2010-11-01 ENCOUNTER — Telehealth: Payer: Self-pay | Admitting: Pulmonary Disease

## 2010-11-01 NOTE — Telephone Encounter (Signed)
Mailed records per pt's request.Heather Dorsey

## 2010-11-04 ENCOUNTER — Telehealth: Payer: Self-pay | Admitting: Pulmonary Disease

## 2010-11-04 ENCOUNTER — Emergency Department (HOSPITAL_COMMUNITY)
Admission: EM | Admit: 2010-11-04 | Discharge: 2010-11-04 | Disposition: A | Discharge status: Home / Self Care | Payer: PRIVATE HEALTH INSURANCE | Attending: Emergency Medicine | Admitting: Emergency Medicine

## 2010-11-04 ENCOUNTER — Emergency Department (HOSPITAL_COMMUNITY): Payer: PRIVATE HEALTH INSURANCE

## 2010-11-04 DIAGNOSIS — Z8509 Personal history of malignant neoplasm of other digestive organs: Secondary | ICD-10-CM | POA: Insufficient documentation

## 2010-11-04 DIAGNOSIS — Z9889 Other specified postprocedural states: Secondary | ICD-10-CM | POA: Insufficient documentation

## 2010-11-04 DIAGNOSIS — R5383 Other fatigue: Secondary | ICD-10-CM | POA: Insufficient documentation

## 2010-11-04 DIAGNOSIS — R109 Unspecified abdominal pain: Secondary | ICD-10-CM

## 2010-11-04 DIAGNOSIS — R5381 Other malaise: Secondary | ICD-10-CM | POA: Insufficient documentation

## 2010-11-04 DIAGNOSIS — N390 Urinary tract infection, site not specified: Secondary | ICD-10-CM | POA: Insufficient documentation

## 2010-11-04 LAB — URINALYSIS, ROUTINE W REFLEX MICROSCOPIC
Glucose, UA: NEGATIVE mg/dL
Ketones, ur: NEGATIVE mg/dL
Nitrite: NEGATIVE
Specific Gravity, Urine: 1.007 (ref 1.005–1.030)
pH: 7.5 (ref 5.0–8.0)

## 2010-11-04 LAB — URINE MICROSCOPIC-ADD ON

## 2010-11-04 LAB — COMPREHENSIVE METABOLIC PANEL
ALT: 12 U/L (ref 0–35)
AST: 17 U/L (ref 0–37)
Albumin: 3.3 g/dL — ABNORMAL LOW (ref 3.5–5.2)
Calcium: 10 mg/dL (ref 8.4–10.5)
Creatinine, Ser: 0.82 mg/dL (ref 0.50–1.10)
Sodium: 138 mEq/L (ref 135–145)
Total Protein: 7 g/dL (ref 6.0–8.3)

## 2010-11-04 NOTE — Telephone Encounter (Signed)
Error.Heather Dorsey ° °

## 2010-11-04 NOTE — Telephone Encounter (Signed)
lmomtcb for rita--pts daughter.

## 2010-11-05 LAB — URINE CULTURE: Colony Count: 50000

## 2010-11-05 NOTE — Telephone Encounter (Signed)
Called # provided above - [email or web address] 

## 2010-11-05 NOTE — Telephone Encounter (Signed)
lmomtcb---per SN---pain in her side where the stent was placed needs to be checked by GI--SN recs that this be followed up by the specialists---Dr., Odogwu or Dr. Coral Else at [facility] or Dr. Elnoria Howard in [place].  thanks

## 2010-11-05 NOTE — Telephone Encounter (Signed)
[place] Sink called and stated that she took her mother to the er last night.  Could not find anything but bacteria in her urine--she is now on keflex 250mg   For this.  She saw Dr. Effie Shy in the er.  Pain in her stomach and right side where they placed the stent.  The pain is usually after she eats.  Daughter is unsure of what to do next.  Pt usually takes the tylenol for the pain but this knocks the pt  Out and she is good for nothing the rest of the day per the daughter.  SN please advise. thanks

## 2010-11-08 ENCOUNTER — Telehealth: Payer: Self-pay | Admitting: Pulmonary Disease

## 2010-11-08 NOTE — Telephone Encounter (Signed)
lmomtcb  Ok to give verbal order for the home PT for the pt.

## 2010-11-08 NOTE — Telephone Encounter (Signed)
Spoke with the pt daughter and she request a referral be sent to Dr. Elnoria Howard so the pt can see him. Order placed.Carron Curie, CMA

## 2010-11-08 NOTE — Telephone Encounter (Signed)
Called to give gracie verbal order for home PT for the pt.  lmom again to make her aware ok for the home PT.

## 2010-11-08 NOTE — Telephone Encounter (Signed)
LMTCB

## 2010-11-09 NOTE — Telephone Encounter (Signed)
Called and spoke with pts daughter and she stated that she has the rx for the keflex from the ER but has never filled it due to not knowing the results of the urine culture from the er.  i explained to the daughter that they treated her for the uti and that is why they gave her the keflex and she will need to go ahead and fill the rx and start her mother on this abx.  She also requested copy of er note and this has been mailed to her.  She stated that her mother saw Dr. Elnoria Howard the other day and they are going to request her records from [facility] for review.

## 2010-11-09 NOTE — Telephone Encounter (Signed)
Spoke with pt daughter and she is asking for lab results from pt ER visit. I have printed the results. Please advise. Carron Curie, CMA

## 2010-11-12 ENCOUNTER — Telehealth: Payer: Self-pay | Admitting: Pulmonary Disease

## 2010-11-12 NOTE — Telephone Encounter (Signed)
Heather Dorsey with [facility] called back.  She stated pt refused nurse visits this week because "she is used to her regular nurse, Cicero Duck"  And Cicero Duck is currently out this week d/t a family emergency.  Heather Dorsey just wanted to give this message to [facility] as an [place].

## 2010-11-12 NOTE — Telephone Encounter (Signed)
SN is aware but stated that the daughter could talk to the pt and allow the other nurse to come in an at least check the pt to make sure she is doing ok.  thanks

## 2010-11-12 NOTE — Telephone Encounter (Signed)
SN is aware. 

## 2010-11-12 NOTE — Telephone Encounter (Signed)
[id number] 

## 2010-11-22 ENCOUNTER — Other Ambulatory Visit: Payer: Self-pay | Admitting: Hematology and Oncology

## 2010-11-22 ENCOUNTER — Encounter (HOSPITAL_BASED_OUTPATIENT_CLINIC_OR_DEPARTMENT_OTHER): Payer: Medicare Other | Admitting: Hematology and Oncology

## 2010-11-22 DIAGNOSIS — D638 Anemia in other chronic diseases classified elsewhere: Secondary | ICD-10-CM

## 2010-11-22 DIAGNOSIS — C250 Malignant neoplasm of head of pancreas: Secondary | ICD-10-CM

## 2010-11-22 DIAGNOSIS — C9000 Multiple myeloma not having achieved remission: Secondary | ICD-10-CM

## 2010-11-22 DIAGNOSIS — M069 Rheumatoid arthritis, unspecified: Secondary | ICD-10-CM

## 2010-11-22 DIAGNOSIS — Z452 Encounter for adjustment and management of vascular access device: Secondary | ICD-10-CM

## 2010-11-22 LAB — CBC WITH DIFFERENTIAL/PLATELET
Basophils Absolute: 0 10*3/uL (ref 0.0–0.1)
Eosinophils Absolute: 0.1 10*3/uL (ref 0.0–0.5)
HCT: 33.5 % — ABNORMAL LOW (ref 34.8–46.6)
HGB: 11.3 g/dL — ABNORMAL LOW (ref 11.6–15.9)
LYMPH%: 7.8 % — ABNORMAL LOW (ref 14.0–49.7)
MCHC: 33.7 g/dL (ref 31.5–36.0)
MONO#: 0.3 10*3/uL (ref 0.1–0.9)
NEUT%: 85 % — ABNORMAL HIGH (ref 38.4–76.8)
Platelets: 151 10*3/uL (ref 145–400)
WBC: 5.3 10*3/uL (ref 3.9–10.3)

## 2010-11-22 LAB — COMPREHENSIVE METABOLIC PANEL
BUN: 9 mg/dL (ref 6–23)
CO2: 20 mEq/L (ref 19–32)
Creatinine, Ser: 0.76 mg/dL (ref 0.50–1.10)
Glucose, Bld: 99 mg/dL (ref 70–99)
Total Bilirubin: 0.6 mg/dL (ref 0.3–1.2)

## 2010-11-29 ENCOUNTER — Telehealth: Payer: Self-pay | Admitting: Pulmonary Disease

## 2010-11-29 NOTE — Telephone Encounter (Signed)
I spoke with [place] Sink and she states pt has had swelling in her eyes and cheeks x Thursday. [place] Sink states they have no changed anything in pt's diet. Pt did start taking bee capsule Aug 18 doing 1/2 qd, They are not sure if this is the cause of the swelling. Offered OV with TP in the [facility] location tomorrow but refused and preferred the [facility] location. Pt is scheduled gto see TP tomorrow at 3:00

## 2010-11-30 ENCOUNTER — Emergency Department (HOSPITAL_COMMUNITY)
Admission: EM | Admit: 2010-11-30 | Discharge: 2010-11-30 | Discharge status: Against Medical Advice | Payer: Medicare Other | Attending: Emergency Medicine | Admitting: Emergency Medicine

## 2010-11-30 ENCOUNTER — Encounter: Payer: Self-pay | Admitting: Adult Health

## 2010-11-30 ENCOUNTER — Ambulatory Visit (INDEPENDENT_AMBULATORY_CARE_PROVIDER_SITE_OTHER): Payer: Medicare Other | Admitting: Adult Health

## 2010-11-30 ENCOUNTER — Other Ambulatory Visit (INDEPENDENT_AMBULATORY_CARE_PROVIDER_SITE_OTHER): Payer: Medicare Other

## 2010-11-30 VITALS — BP 120/80 | HR 73 | Temp 97.0°F | Ht 64.0 in | Wt 146.6 lb

## 2010-11-30 DIAGNOSIS — N390 Urinary tract infection, site not specified: Secondary | ICD-10-CM

## 2010-11-30 DIAGNOSIS — R221 Localized swelling, mass and lump, neck: Secondary | ICD-10-CM | POA: Insufficient documentation

## 2010-11-30 DIAGNOSIS — R220 Localized swelling, mass and lump, head: Secondary | ICD-10-CM | POA: Insufficient documentation

## 2010-11-30 LAB — URINALYSIS
Leukocytes, UA: NEGATIVE
Nitrite: NEGATIVE
Total Protein, Urine: NEGATIVE
pH: 7 (ref 5.0–8.0)

## 2010-11-30 MED ORDER — PREDNISONE 10 MG PO TABS: 10.0000 mg | ORAL_TABLET | Freq: Every day | ORAL | Status: DC

## 2010-11-30 NOTE — Progress Notes (Signed)
Subjective:    Patient ID: Heather Dorsey, female    DOB: 1923/11/25, 77 y.o.   MRN: [id number]  HPI 77 y/o BF here for a follow up visit... she has multiple medical problems as noted below... Followed for general medical purposes w/ hx of seroneg RA & underlying pulm fibrosis, DJD & LBP, Osteopenia, mild dementia, and Monoclonal Gammopathy/ smoldering myeloma per DrOdogwu... also followed by [facility] for Rheum, and DrLucey for Ortho >> NOTE: she was hosp 11/11 w/ jaundice & abd discomfort w/ dx of Pancreatic Cancer...  ~  September 17, 2009:  she had f/u DrOdogwu 6/11- stable myeloma markers w/ Q9mo observation recommended... breathing is OK- denies cough, sputum, hemoptysis, ch in dyspnea, etc (last CXR 1/11 showed decr LVs & incr markings)... Pred 5mg  at 1.5 tab Qam, & MTX stable at 6 tabs per week... generally stable> continue same meds and follow ups.  ~  December 04, 2009:  Add-on appt at request of home health nurse for low BP 72/54> pt admits to being "a little tired" but overall feels well- denies weakness, postural symptoms, dizzy, syncope, falls, etc... she is not on any vasoactive meds & doesn't have hx of HBP, no recent URI, gastroenteritis, or urinary symptoms... she has DJD & seroneg RA followed by Rheum... BP by nurse here= 118/68 & I check 120/70 w/o postural changes... we decided to check routine labs to be sure all is OK...  ~  March 05, 2010:  she was hosp 11/11 w/ abd discomfort & jaundice> diagnosed w/ pancreatic cancer w/ stent placed in common bile duct >> she was transfered to [facility] (their notes are pending)...  ~  Jul 23, 2010:  11mo ROV & add-on for right ear symptoms>  She has been followed for her pancreatic cancer by DrOdogwu here & [facility]O'Neil [facility], daughter mentions that they have stopped her chemoRx (last chemo given 3/12);  She went to [facility] for hearing eval & the technician saw something in her right ear & was told to come right away> they note decr hearing in right  ear comes & goes, itching & Rx w/ olive oil on Qtip, they deny nasal problems;  Exam shows ?right OM w/ effusion ==> rec Augmentin, Dosepak, Cortisporin, & refer to ENT in case tube needed (eval by drRosen showed hearing loss & 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  Lab data in EPIC reviewed from March-April, last Oncology note we have is from 2/12 & reviewed w/ daughter...  ~  October 28, 2010:  35mo ROV & she has been followed very closely by DrOdogwu w/ labs every 72mo; she has lost some weight from 163# to 155#, appetite fair, eating ok she says; c/o tired, SOB, sleeping all the time, gas pains in right lower rib area, forgetful> & she has seen both DrOdogwu & DrO'Neil at [facility], and they both suggested f/u w/ her primary for these symptoms;  We discussed her diagnoses, plan to re-eval CXR (no ch in fibrosis, NAD) & blood work (Chems- ok x BS145, CBC- Hg11.8, TSH norm) w/ rec for increased exercise program eg- silver sneakers vs, senior center; & Protonix, Mylicon, Phazyme rx... They request refill Rxs today...  11/30/2010 Acute office visit.  Complains of  whole face swelling for 5 days. Family thinks it may be due to bee capsules. Started on b complex vit x 2 months ago along with a multivitamin. Was seen in ER on 8/23 for this problem.  IT waxes and wanes -  face feels puffy all over. Eye lids feel puffy, mild watery eyes. No tongue swelling, wheezing or dysphagia .   Had recent UTI given abx keflex. Reviewed cx results did not show significant growth. Still has some urgency at times. Wants urine rechecked. No back pain or discharge. No hematuria         Problem List:  ALLERGIC RHINITIS (ICD-477.9) - we discussed OTC meds avail to her for her allergy symptoms- Claritin, Zyrtek, nasal saline, etc...  BRONCHITIS, RECURRENT (ICD-491.9) - no recent change in cough, no sputum, fever, etc...  PULMONARY FIBROSIS (ICD-515) - on PREDNISONE 5mg /d now & stable,  NEB w/ Albut Prn, & GFN (she  refuses Pulmicort as she says it made her sleepwalk and fall)...   ~  baseline CXR w/ incr markings, NAD... ~  prev PFT's w/ mod restrictive dis...  s/p participation in pulm rehab program...  ~  CTChest in 1999 showed mild fibrosis, some bronchiectasis in LL's... ~  last admitted 3/07 w/ AB exac superimposed on her underlying stable pulm fibrosis.Marland Kitchen. ~  f/u CXR 5/10 = mild basilar reticular interstitial dis/ scarring, no change. ~  f/u CXR 1/11 showed sl decr LVs & incr markings (?sl progression), tort Ao, etc... ~  f/u CXR 11/11 showed incr markings, no acute change... ~  CT Chest 11/11 showed incr markings at bases, 5mm RLL nodule noted, no adenopathy, & dilated hepatic ducts==> pancreatic cancer diagnosed. ~  CXR 8/12 showed stable incr markings c/w her underlying pulm fibrosis, no change...  ADENOCARCINOMA, PANCREAS (ICD-157.9)   <<SEE ABOVE>> new diagnosis 11/11 ~  HOSP DCSummary 02/22/10 w/ Tx to [facility] reviewed... ~  subseq follow up & management by DrOdogwu in [place] & DrONeil at [facility]... ~  8/12: she reports that when last imaged they could not see the mass & they continue to follow scans every 4-76mo...  DEGENERATIVE JOINT DISEASE (ICD-715.90) & BACK PAIN, LUMBAR (ICD-724.2) - w/ hx spinal stenosis and prev eval by DrWillis... daughter prev requested outpt physical therapy w/ hot pack and stimulation therapy... she takes Glucosamine/ Chondroitin daily & Tylenol as needed... notes right knee pain & needs ortho eval... saw DrLucey, then DrDeveshwar- w/ Dx of seroneg RA >>  SERONEGATIVE RHEUMATOID ARTHRITIS (ICD-714.0) - diagnosed by DrDeveshwar and treated w/ PREDNISONE tapering schedule that she was confused about, & PLAQUENIL 200mg Bid... she switched to [facility] & was changed to Pred, MTX, & Folic Acid... ~  May10: right hand pain/ swelling worse since she stopped cortisone therapy per DrD... we re-started Medrol 8mg /d w/ improvement. ~  labs here 5/10 showed Chem-OK, CBC-12.4, Uric-4.3,  Sed-18... ~  6/10 saw DrAnderson w/ change in regimen to PRED 5mg tabs- 3/d now, MTX 2.5mg - 6tabs/wk, Folic 1mg /d... ~  7/11: she is currently taking Pred5mg - 1.5tabs + MTX 6tabs/wk... ~  8/12:  She is currently off the MTX from [facility] (?stopped 11/11), and taking Pred 5mg /d & stable she says...  OSTEOPENIA (ICD-733.90) - she takes Calcium supplement, MVI, Vit D 1000 u daily & Glucosamine... ~  BMD in 2003 was OK w/ TScores > -1...  ~  f/u BMD 11/09 showed TScores +1.5 in Spine, & -0.9 in right FemNeck... rec- continue calcium/ vits. ~  Vit D level 11/09 = 33... encouraged to continue OTC Vit D supplements. ~  labs 5/10 showed Vit D level = 39... rec> 1000 u daily.  ARTERIOSCLEROTIC DEMENTIA (ICD-290.40) - new prob per family in 2010... w/ MMSE showing 0/3 objects at 2 min after distraction... on ASA 81mg /d &  ARICEPT 5mg /d started- they feel that this is helping... ~  7/10:  reviewed med Rx & very important visitng nurse help w/ meds. ~  7/11:  they are content to continue the Aricept at 5mg  dose. ~  8/12:  ditto  Hx of PARESTHESIA (ICD-782.0) - prev eval by neuro in [place] in 2006 was neg...she takes MVI daily.  ANXIETY (ICD-300.00)  MULTIPLE  MYELOMA (ICD-203.00) - labs showing low IgM level & referred to Heme/Onc w/ eval DrOdogwu showing a "smoldering mult myeloma"- SPE did not show an MSpike, but IEP showed a monoclonal IgA lambda protein (light chains were neg).Marland KitchenMarland KitchenBone marrow showed hypercellular marrow w/ 20% plasma cells... decision made for observational protocol... ~  6/11:  [facility] from [facility] reviewed> myeloma markers are stable & Q4mo observation to continue. ~  12/11:  NOTE> she was Dx w/ pancreatic cancer last month... ~  Labs followed by DrOdogwu, her hematologist/ oncologist...  Health Maintenance: ~  GI:   ~  GYN:   ~  Immunizations: she is rec to get the FLu shot yearly;  She had a PNEUMOVAX in 2007;  ?last Tetanus vaccine...   Past Surgical History  Procedure Date   . Appendectomy   . Abdominal hysterectomy   . Common dile duct stent 02/2010    for pancreatic cancer    Outpatient Encounter Prescriptions as of 11/30/2010  Medication Sig Dispense Refill  . acetaminophen (TYLENOL) 325 MG tablet Take 650 mg by mouth every 6 (six) hours as needed.        . ALPRAZolam (XANAX) 0.5 MG tablet Take 0.5 mg by mouth 3 (three) times daily as needed.        Marland Kitchen aspirin 81 MG tablet Take 81 mg by mouth daily.        . B Complex Vitamins (VITAMIN B COMPLEX PO) daily       . Cholecalciferol (VITAMIN D) 1000 UNITS capsule Take 1,000 Units by mouth daily.        Marland Kitchen dexlansoprazole (DEXILANT) 60 MG capsule Take 60 mg by mouth daily.        Marland Kitchen donepezil (ARICEPT) 5 MG tablet Take 1 tablet (5 mg total) by mouth at bedtime.  30 tablet  11  . guaifenesin (MUCUS RELIEF) 400 MG TABS Take 400 mg by mouth every 4 (four) hours as needed.        . megestrol (MEGACE) 20 MG tablet Take 1 tablet (20 mg total) by mouth daily.  90 tablet  1  . Misc. Devices (WALKER) MISC Rolling walker with seat Dx 715.90 & 714.0  1 each  0  . Multiple Vitamin (MULTIVITAMIN) capsule Take 1 capsule by mouth daily. velocity       . NEOMYCIN-POLYMYXIN-HC, OTIC, (CORTISPORIN) 1 % SOLN Place 3 drops into the right ear 3 (three) times daily.  1 Bottle  0  . polyethylene glycol powder (MIRALAX) powder Take 17 g by mouth daily.        . predniSONE (DELTASONE) 5 MG tablet Take 1 tablet (5 mg total) by mouth daily.  30 tablet  11  . simethicone (MYLICON) 125 MG chewable tablet Chew 125 mg by mouth every 6 (six) hours as needed.        Marland Kitchen DISCONTD: pantoprazole (PROTONIX) 40 MG tablet Take 1 tablet by mouth daily.  Take 30 minutes before the first meal of the day.  30 tablet  11    No Known Allergies   Current Medications, Allergies, Past Medical History, Past Surgical History,  Family History, and Social History were reviewed in [employer] record.    Review of Systems        See HPI -  all other systems neg except as noted... The patient complains of anorexia, weight loss, decreased hearing, dyspnea on exertion, abdominal pain, muscle weakness, and difficulty walking.  The patient denies fever, weight gain, vision loss, hoarseness, chest pain, syncope, peripheral edema, prolonged cough, headaches, hemoptysis, melena, hematochezia, severe indigestion/heartburn, hematuria, incontinence, suspicious skin lesions, transient blindness, depression, unusual weight change, abnormal bleeding, enlarged lymph nodes.   Objective:   Physical Exam     WD, WN, 77 y/o BF in NAD..frail  Vital signs:  Reviewed... GENERAL:  Alert, pleasant & cooperative... HEENT:  [place]/AT, EACs-clear, TMs-clear, NOSE-clear, THROAT-clear & wnl., no obvious swelling noted.  No tongue swelling, airway clear  NECK:  Supple w/ fairROM; no JVD; normal carotid impulses w/o bruits; no thyromegaly or nodules palpated; no lymphadenopathy. CHEST:  Coarse BS  no wheezing, no rhonchi, no signs of consolidation... HEART:  Regular Rhythm; without murmurs/ rubs/ or gallops heard... ABDOMEN:  Soft & mild epig tender on palp; normal bowel sounds; no organomegaly or masses detected. EXT:  mild arthritic changes in hands; +arthritic changes in right knee & shoulder; no varicose veins, +venous insuffic, no edema. NEURO:  CN's intact; no focal neuro deficits... DERM:  No lesions noted; no rash etc...   Assessment & Plan:

## 2010-11-30 NOTE — Patient Instructions (Signed)
Stop new vitamins.  Begin Zyrtec 10mg  At bedtime  For 1 week then As needed  For allergies symptoms  Begin Pepcid 10mg  At bedtime  For 1 week then stop .  Increase Prednisone 10mg  daily for 1 week then back to 5mg  daily  Please contact office for sooner follow up if symptoms do not improve or worsen or seek emergency care

## 2010-12-02 ENCOUNTER — Other Ambulatory Visit: Payer: Self-pay | Admitting: Hematology and Oncology

## 2010-12-02 DIAGNOSIS — R220 Localized swelling, mass and lump, head: Secondary | ICD-10-CM

## 2010-12-02 LAB — URINE CULTURE: Colony Count: NO GROWTH

## 2010-12-02 NOTE — Progress Notes (Signed)
Quick Note:  Spoke with pt's daughter [place] Sink, and notified of results/recs per TP. She verbalized understanding and denied any questions. ______

## 2010-12-04 DIAGNOSIS — N390 Urinary tract infection, site not specified: Secondary | ICD-10-CM | POA: Insufficient documentation

## 2010-12-04 DIAGNOSIS — R220 Localized swelling, mass and lump, head: Secondary | ICD-10-CM | POA: Insufficient documentation

## 2010-12-04 NOTE — Assessment & Plan Note (Signed)
?   Etiology, none apparent to todays exam  Family believes related to new vitamins , it is fine to stop these supplements.   Plan:  Stop new vitamins.  Begin Zyrtec 10mg  At bedtime  For 1 week then As needed  For allergies symptoms  Begin Pepcid 10mg  At bedtime  For 1 week then stop .  Increase Prednisone 10mg  daily for 1 week then back to 5mg  daily  Please contact office for sooner follow up if symptoms do not improve or worsen or seek emergency care

## 2010-12-04 NOTE — Assessment & Plan Note (Signed)
Recently tx for UTI w/ keflex. U cx without sign growth Will recheck Urine cx today.

## 2010-12-06 ENCOUNTER — Ambulatory Visit (HOSPITAL_COMMUNITY)
Admission: RE | Admit: 2010-12-06 | Discharge: 2010-12-06 | Disposition: A | Discharge status: Home / Self Care | Payer: Medicare Other | Source: Ambulatory Visit | Attending: Hematology and Oncology | Admitting: Hematology and Oncology

## 2010-12-06 DIAGNOSIS — R220 Localized swelling, mass and lump, head: Secondary | ICD-10-CM

## 2010-12-07 ENCOUNTER — Encounter (HOSPITAL_BASED_OUTPATIENT_CLINIC_OR_DEPARTMENT_OTHER): Payer: Medicare Other | Admitting: Hematology and Oncology

## 2010-12-07 ENCOUNTER — Ambulatory Visit (HOSPITAL_COMMUNITY)
Admission: RE | Admit: 2010-12-07 | Discharge: 2010-12-07 | Disposition: A | Discharge status: Home / Self Care | Payer: Medicare Other | Source: Ambulatory Visit | Attending: Hematology and Oncology | Admitting: Hematology and Oncology

## 2010-12-07 ENCOUNTER — Other Ambulatory Visit: Payer: Self-pay | Admitting: Hematology and Oncology

## 2010-12-07 DIAGNOSIS — M79609 Pain in unspecified limb: Secondary | ICD-10-CM

## 2010-12-07 DIAGNOSIS — I82619 Acute embolism and thrombosis of superficial veins of unspecified upper extremity: Secondary | ICD-10-CM

## 2010-12-07 DIAGNOSIS — M7989 Other specified soft tissue disorders: Secondary | ICD-10-CM

## 2010-12-07 DIAGNOSIS — C250 Malignant neoplasm of head of pancreas: Secondary | ICD-10-CM

## 2010-12-07 DIAGNOSIS — Z452 Encounter for adjustment and management of vascular access device: Secondary | ICD-10-CM

## 2010-12-07 DIAGNOSIS — D649 Anemia, unspecified: Secondary | ICD-10-CM

## 2010-12-07 LAB — PROTIME-INR
INR: 1.2 — ABNORMAL LOW (ref 2.00–3.50)
Protime: 14.4 Seconds — ABNORMAL HIGH (ref 10.6–13.4)

## 2010-12-08 LAB — BASIC METABOLIC PANEL
BUN: 8
Calcium: 9.6
Creatinine, Ser: 0.93
GFR calc non Af Amer: 58 — ABNORMAL LOW

## 2010-12-08 LAB — POCT CARDIAC MARKERS
Myoglobin, poc: 74.5
Operator id: 290111
Troponin i, poc: <0.05

## 2010-12-08 LAB — DIFFERENTIAL
Lymphocytes Relative: 9 — ABNORMAL LOW
Lymphs Abs: 0.6 — ABNORMAL LOW
Neutrophils Relative %: 87 — ABNORMAL HIGH

## 2010-12-08 LAB — CBC
Platelets: 173
WBC: 6.7

## 2010-12-09 ENCOUNTER — Encounter (HOSPITAL_BASED_OUTPATIENT_CLINIC_OR_DEPARTMENT_OTHER): Payer: Medicare Other | Admitting: Hematology and Oncology

## 2010-12-09 ENCOUNTER — Other Ambulatory Visit: Payer: Self-pay | Admitting: Hematology and Oncology

## 2010-12-09 DIAGNOSIS — C250 Malignant neoplasm of head of pancreas: Secondary | ICD-10-CM

## 2010-12-09 DIAGNOSIS — Z452 Encounter for adjustment and management of vascular access device: Secondary | ICD-10-CM

## 2010-12-09 DIAGNOSIS — I82619 Acute embolism and thrombosis of superficial veins of unspecified upper extremity: Secondary | ICD-10-CM

## 2010-12-09 LAB — CBC WITH DIFFERENTIAL/PLATELET
Eosinophils Absolute: 0.1 10*3/uL (ref 0.0–0.5)
HCT: 32.6 % — ABNORMAL LOW (ref 34.8–46.6)
LYMPH%: 7.8 % — ABNORMAL LOW (ref 14.0–49.7)
MCHC: 34 g/dL (ref 31.5–36.0)
MCV: 94.8 fL (ref 79.5–101.0)
MONO%: 6.1 % (ref 0.0–14.0)
NEUT#: 3.7 10*3/uL (ref 1.5–6.5)
NEUT%: 84.3 % — ABNORMAL HIGH (ref 38.4–76.8)
Platelets: 128 10*3/uL — ABNORMAL LOW (ref 145–400)
RBC: 3.44 10*6/uL — ABNORMAL LOW (ref 3.70–5.45)

## 2010-12-09 LAB — COMPREHENSIVE METABOLIC PANEL
Alkaline Phosphatase: 74 U/L (ref 39–117)
Creatinine, Ser: 0.81 mg/dL (ref 0.50–1.10)
Glucose, Bld: 118 mg/dL — ABNORMAL HIGH (ref 70–99)
Sodium: 143 mEq/L (ref 135–145)
Total Bilirubin: 0.4 mg/dL (ref 0.3–1.2)
Total Protein: 5.9 g/dL — ABNORMAL LOW (ref 6.0–8.3)

## 2010-12-09 LAB — PROTIME-INR: Protime: 14.4 Seconds — ABNORMAL HIGH (ref 10.6–13.4)

## 2010-12-13 ENCOUNTER — Encounter (HOSPITAL_BASED_OUTPATIENT_CLINIC_OR_DEPARTMENT_OTHER): Payer: Medicare Other | Admitting: Hematology and Oncology

## 2010-12-13 ENCOUNTER — Other Ambulatory Visit: Payer: Self-pay | Admitting: Hematology and Oncology

## 2010-12-13 DIAGNOSIS — C250 Malignant neoplasm of head of pancreas: Secondary | ICD-10-CM

## 2010-12-13 DIAGNOSIS — Z452 Encounter for adjustment and management of vascular access device: Secondary | ICD-10-CM

## 2010-12-13 DIAGNOSIS — I82619 Acute embolism and thrombosis of superficial veins of unspecified upper extremity: Secondary | ICD-10-CM

## 2010-12-13 LAB — PROTIME-INR
INR: 3 (ref 2.00–3.50)
Protime: 36 Seconds — ABNORMAL HIGH (ref 10.6–13.4)

## 2010-12-21 ENCOUNTER — Ambulatory Visit (INDEPENDENT_AMBULATORY_CARE_PROVIDER_SITE_OTHER): Payer: Medicare Other

## 2010-12-21 DIAGNOSIS — Z23 Encounter for immunization: Secondary | ICD-10-CM

## 2010-12-23 ENCOUNTER — Other Ambulatory Visit: Payer: Self-pay | Admitting: Hematology and Oncology

## 2010-12-23 ENCOUNTER — Encounter (HOSPITAL_BASED_OUTPATIENT_CLINIC_OR_DEPARTMENT_OTHER): Payer: Medicare Other | Admitting: Hematology and Oncology

## 2010-12-23 DIAGNOSIS — M069 Rheumatoid arthritis, unspecified: Secondary | ICD-10-CM

## 2010-12-23 DIAGNOSIS — C259 Malignant neoplasm of pancreas, unspecified: Secondary | ICD-10-CM

## 2010-12-23 DIAGNOSIS — C250 Malignant neoplasm of head of pancreas: Secondary | ICD-10-CM

## 2010-12-23 DIAGNOSIS — I82619 Acute embolism and thrombosis of superficial veins of unspecified upper extremity: Secondary | ICD-10-CM

## 2010-12-23 DIAGNOSIS — D638 Anemia in other chronic diseases classified elsewhere: Secondary | ICD-10-CM

## 2010-12-23 DIAGNOSIS — Z5112 Encounter for antineoplastic immunotherapy: Secondary | ICD-10-CM

## 2010-12-23 LAB — COMPREHENSIVE METABOLIC PANEL
Albumin: 3.7 g/dL (ref 3.5–5.2)
Alkaline Phosphatase: 82 U/L (ref 39–117)
BUN: 11 mg/dL (ref 6–23)
Calcium: 9.1 mg/dL (ref 8.4–10.5)
Chloride: 109 mEq/L (ref 96–112)
Glucose, Bld: 85 mg/dL (ref 70–99)
Potassium: 3.4 mEq/L — ABNORMAL LOW (ref 3.5–5.3)
Sodium: 141 mEq/L (ref 135–145)
Total Protein: 6.4 g/dL (ref 6.0–8.3)

## 2010-12-23 LAB — CBC WITH DIFFERENTIAL/PLATELET
Basophils Absolute: 0 10*3/uL (ref 0.0–0.1)
Eosinophils Absolute: 0.1 10*3/uL (ref 0.0–0.5)
HGB: 12.3 g/dL (ref 11.6–15.9)
MCV: 94.7 fL (ref 79.5–101.0)
MONO#: 0.5 10*3/uL (ref 0.1–0.9)
NEUT#: 3.3 10*3/uL (ref 1.5–6.5)
RBC: 3.9 10*6/uL (ref 3.70–5.45)
RDW: 15.7 % — ABNORMAL HIGH (ref 11.2–14.5)
WBC: 4.5 10*3/uL (ref 3.9–10.3)
lymph#: 0.5 10*3/uL — ABNORMAL LOW (ref 0.9–3.3)

## 2010-12-26 ENCOUNTER — Other Ambulatory Visit: Payer: Self-pay | Admitting: Pulmonary Disease

## 2010-12-27 ENCOUNTER — Telehealth: Payer: Self-pay | Admitting: Pulmonary Disease

## 2010-12-27 ENCOUNTER — Other Ambulatory Visit: Payer: Self-pay | Admitting: Hematology and Oncology

## 2010-12-27 ENCOUNTER — Encounter (HOSPITAL_BASED_OUTPATIENT_CLINIC_OR_DEPARTMENT_OTHER): Payer: Medicare Other | Admitting: Hematology and Oncology

## 2010-12-27 DIAGNOSIS — H269 Unspecified cataract: Secondary | ICD-10-CM

## 2010-12-27 DIAGNOSIS — Z452 Encounter for adjustment and management of vascular access device: Secondary | ICD-10-CM

## 2010-12-27 DIAGNOSIS — D804 Selective deficiency of immunoglobulin M [IgM]: Secondary | ICD-10-CM

## 2010-12-27 DIAGNOSIS — C250 Malignant neoplasm of head of pancreas: Secondary | ICD-10-CM

## 2010-12-27 LAB — CBC WITH DIFFERENTIAL/PLATELET
BASO%: 0.4 % (ref 0.0–2.0)
Basophils Absolute: 0 10*3/uL (ref 0.0–0.1)
EOS%: 2 % (ref 0.0–7.0)
HCT: 36.3 % (ref 34.8–46.6)
HGB: 12.5 g/dL (ref 11.6–15.9)
MCH: 31 pg (ref 25.1–34.0)
MCHC: 34.4 g/dL (ref 31.5–36.0)
MCV: 90.1 fL (ref 79.5–101.0)
MONO%: 6 % (ref 0.0–14.0)
NEUT%: 81.6 % — ABNORMAL HIGH (ref 38.4–76.8)
RDW: 15 % — ABNORMAL HIGH (ref 11.2–14.5)
lymph#: 0.6 10*3/uL — ABNORMAL LOW (ref 0.9–3.3)

## 2010-12-27 LAB — PROTIME-INR

## 2010-12-27 NOTE — Telephone Encounter (Signed)
Medications have been sent in earlier today, pt will go back to ENT Dr. Pollyann Kennedy for follow up on hearing.   Pt is requesting a referral for 2nd opinion to Dr. Nile Riggs who advised pt has Cataracts. Will need and appointment for end of Nov or beginning of Dec 2012. Please advise. Thanks!

## 2010-12-27 NOTE — Telephone Encounter (Signed)
Per SN---recs for 2nd opinion to [facility] ---order has been placed for this per pts daughter request.

## 2010-12-27 NOTE — Telephone Encounter (Signed)
[place] Sink, pt's daughter, returned call & can be reached at [phone].  Antionette Fairy

## 2010-12-27 NOTE — Telephone Encounter (Signed)
Heather Dorsey is aware order has been placed. Nothing further was needed

## 2010-12-27 NOTE — Telephone Encounter (Signed)
Left message to call back. Looks like rx refills have already been sent in, with refills, will need to know what caller needs. Julaine Hua, CMA

## 2010-12-29 ENCOUNTER — Telehealth: Payer: Self-pay | Admitting: Pulmonary Disease

## 2010-12-29 DIAGNOSIS — M1990 Unspecified osteoarthritis, unspecified site: Secondary | ICD-10-CM

## 2010-12-29 NOTE — Telephone Encounter (Signed)
Pt's daughter says that pt's physical therapy and home nursing care will end on Mon., 10/22. The daughter feels that the pt still needs both of these services because she is still weak and is afraid she will not take her medications properly. The daughter states that she needs to go back home but cannot do so without help in the pt's home. Per Lawson Fiscal, physical therapist, they were allowed 10 visits with the pt per pt's insurance and the last visit will be on 10/22. A new order will be needed if Sn feels pt still needs services for nursing and PT. Pls advise.

## 2010-12-29 NOTE — Telephone Encounter (Signed)
Per SN OK to resume both

## 2010-12-29 NOTE — Telephone Encounter (Signed)
Order sent to [facility] and pt's daughter [place] Sink was notified this was done.

## 2010-12-30 ENCOUNTER — Telehealth: Payer: Self-pay | Admitting: Pulmonary Disease

## 2010-12-30 NOTE — Telephone Encounter (Signed)
Spoke to [place] she is aware records and order has been given to [facility] to get started with PT and nurse aide in home

## 2011-01-11 ENCOUNTER — Telehealth: Payer: Self-pay | Admitting: Pulmonary Disease

## 2011-01-11 NOTE — Telephone Encounter (Signed)
Called and spoke with [facility] and she wanted to give an FYI to SN about this pt.  Pt has no cough but she does have some clear phlegm that she gets up occasionally.  Lung sounds had some soft rayl es and she just wanted to make sure that SN was aware.  Pt last seen in august 2012 by SN and told to follow up in 2-3 months.  Harvin Hazel is aware that pt does not have a follow up scheduled and she will speak with pts daughter [place] Sink about scheduling a follow up appt.   FYI for SN.

## 2011-01-17 ENCOUNTER — Telehealth: Payer: Self-pay | Admitting: Pulmonary Disease

## 2011-01-17 ENCOUNTER — Ambulatory Visit: Payer: Self-pay | Admitting: Hematology and Oncology

## 2011-01-17 DIAGNOSIS — I82629 Acute embolism and thrombosis of deep veins of unspecified upper extremity: Secondary | ICD-10-CM | POA: Insufficient documentation

## 2011-01-17 NOTE — Telephone Encounter (Signed)
[id number] with [facility]. Carron Curie, CMA

## 2011-01-17 NOTE — Telephone Encounter (Signed)
Spoke with pt's daughter [place] Sink and sched pt appt with SN for 10 am tomorrow.

## 2011-01-17 NOTE — Telephone Encounter (Signed)
Spoke with [employer]. She states that the pt has been c/o increased fatigue the past several days and also having some loud crackles in the RLL. She states that the pt does not c/o increased SOB and states that her breathing is the same as usual for her, but she thinks that the pt just does not remember what her normal baseline is. She has had no fever or cough. She thinks that pt needs to be seen sooner than her scheduled appt with SN in Dec. She would like to see if SN could see her sooner and then have [place] call pt's daughter, [place] Sink at [phone] or [phone] to coordinate this appt. Please advise, thanks!

## 2011-01-17 NOTE — Telephone Encounter (Signed)
We can use one of SN blocked spots to add pt on---the problem is lining up this appt with the pts daughter as well because she lives out of town.  thanks

## 2011-01-18 ENCOUNTER — Ambulatory Visit (INDEPENDENT_AMBULATORY_CARE_PROVIDER_SITE_OTHER): Payer: Medicare Other | Admitting: Pulmonary Disease

## 2011-01-18 DIAGNOSIS — F0150 Vascular dementia without behavioral disturbance: Secondary | ICD-10-CM

## 2011-01-18 DIAGNOSIS — I82629 Acute embolism and thrombosis of deep veins of unspecified upper extremity: Secondary | ICD-10-CM

## 2011-01-18 DIAGNOSIS — M899 Disorder of bone, unspecified: Secondary | ICD-10-CM

## 2011-01-18 DIAGNOSIS — M949 Disorder of cartilage, unspecified: Secondary | ICD-10-CM

## 2011-01-18 DIAGNOSIS — M069 Rheumatoid arthritis, unspecified: Secondary | ICD-10-CM

## 2011-01-18 DIAGNOSIS — C9000 Multiple myeloma not having achieved remission: Secondary | ICD-10-CM

## 2011-01-18 DIAGNOSIS — J8410 Pulmonary fibrosis, unspecified: Secondary | ICD-10-CM

## 2011-01-18 DIAGNOSIS — M1990 Unspecified osteoarthritis, unspecified site: Secondary | ICD-10-CM

## 2011-01-18 DIAGNOSIS — C259 Malignant neoplasm of pancreas, unspecified: Secondary | ICD-10-CM

## 2011-01-18 DIAGNOSIS — J42 Unspecified chronic bronchitis: Secondary | ICD-10-CM

## 2011-01-18 MED ORDER — HYDROCODONE-HOMATROPINE 5-1.5 MG/5ML PO SYRP: 5.0000 mL | ORAL_SOLUTION | Freq: Four times a day (QID) | ORAL | Status: AC | PRN

## 2011-01-18 NOTE — Progress Notes (Signed)
Subjective:    Patient ID: Heather Dorsey, female    DOB: 1923-11-10, 77 y.o.   MRN: [id number]  HPI 77 y/o BF here for a follow up visit... she has multiple medical problems as noted below... Followed for general medical purposes w/ hx of seroneg RA & underlying pulm fibrosis, DJD & LBP, Osteopenia, mild dementia, and Monoclonal Gammopathy/ smoldering myeloma per DrOdogwu... also followed by [facility] for Rheum, and DrLucey for Ortho >> NOTE: she was hosp 11/11 w/ jaundice & abd discomfort w/ dx of Pancreatic Cancer...  ~  September 17, 2009:  she had f/u DrOdogwu 6/11- stable myeloma markers w/ Q64mo observation recommended... breathing is OK- denies cough, sputum, hemoptysis, ch in dyspnea, etc (last CXR 1/11 showed decr LVs & incr markings)... Pred 5mg  at 1.5 tab Qam, & MTX stable at 6 tabs per week... generally stable> continue same meds and follow ups.  ~  December 04, 2009:  Add-on appt at request of home health nurse for low BP 72/54> pt admits to being "a little tired" but overall feels well- denies weakness, postural symptoms, dizzy, syncope, falls, etc... she is not on any vasoactive meds & doesn't have hx of HBP, no recent URI, gastroenteritis, or urinary symptoms... she has DJD & seroneg RA followed by Rheum... BP by nurse here= 118/68 & I check 120/70 w/o postural changes... we decided to check routine labs to be sure all is OK...  ~  March 05, 2010:  she was hosp 11/11 w/ abd discomfort & jaundice> diagnosed w/ pancreatic cancer w/ stent placed in common bile duct >> she was transfered to [facility] (their notes are pending)...  ~  Jul 23, 2010:  78mo ROV & add-on for right ear symptoms>  She has been followed for her pancreatic cancer by DrOdogwu here & DrO'Neil at [facility], daughter mentions that they have stopped her chemoRx (last chemo given 3/12);  She went to [facility] for hearing eval & the technician saw something in her right ear & was told to come right away> they note decr hearing in right  ear comes & goes, itching & Rx w/ olive oil on Qtip, they deny nasal problems;  Exam shows ?right OM w/ effusion ==> rec Augmentin, Dosepak, Cortisporin, & refer to ENT in case tube needed (eval by Gayla Medicus showed hearing loss & 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  Lab data in EPIC reviewed from March-April, last Oncology note we have is from 2/12 & reviewed w/ daughter...  ~  October 28, 2010:  59mo ROV & she has been followed very closely by DrOdogwu w/ labs every 212mo; she has lost some weight from 163# to 155#, appetite fair, eating ok she says; c/o tired, SOB, sleeping all the time, gas pains in right lower rib area, forgetful> & she has seen both DrOdogwu & DrO'Neil at [facility], and they both suggested f/u w/ her primary for these symptoms;  We discussed her diagnoses, plan to re-eval CXR (no ch in fibrosis, NAD) & blood work (Chems- ok x BS145, CBC- Hg11.8, TSH norm) w/ rec for increased exercise program eg- silver sneakers vs, senior center; & Protonix, Mylicon, Phazyme rx... They request refill Rxs today...  ~  January 18, 2011:  59mo ROV & she notes some facial swelling that she thinks is due to bee pollen capsules that she's been taking;  Also c/o mucus in chest & throat esp at night & early AM> we discussed Rx w/ MUCINEX 2Bid & lots of  water, she also requests cough syrup & we wrote for Hycodan Prn use...  She remains on COUMADIN per DrOdogwu for upper extremity DVT (currently on 2mg  tabs- taking , 2TThSS) & they follow her protimes...  She gets freq labs at the [facility] & reviewed in EPIC...  She is felt to have stable disease...         Problem List:  ALLERGIC RHINITIS (ICD-477.9) - we discussed OTC meds avail to her for her allergy symptoms- Claritin, Zyrtek, nasal saline, etc...  BRONCHITIS, RECURRENT (ICD-491.9) - recent change in cough, but no sputum, fever, etc... Rx for HYCODAN cough syrup for prn use...  PULMONARY FIBROSIS (ICD-515) - on PREDNISONE 5mg /d now & stable,  NEB  w/ Albut Prn, & GFN (she refuses Pulmicort as she says it made her sleepwalk and fall)...   ~  baseline CXR w/ incr markings, NAD... ~  prev PFT's w/ mod restrictive dis...  s/p participation in pulm rehab program...  ~  CTChest in 1999 showed mild fibrosis, some bronchiectasis in LL's... ~  last admitted 3/07 w/ AB exac superimposed on her underlying stable pulm fibrosis.Marland Kitchen. ~  f/u CXR 5/10 = mild basilar reticular interstitial dis/ scarring, no change. ~  f/u CXR 1/11 showed sl decr LVs & incr markings (?sl progression), tort Ao, etc... ~  f/u CXR 11/11 showed incr markings, no acute change... ~  CT Chest 11/11 showed incr markings at bases, 5mm RLL nodule noted, no adenopathy, & dilated hepatic ducts==> pancreatic cancer diagnosed. ~  CXR 8/12 showed stable incr markings c/w her underlying pulm fibrosis, no change...  ADENOCARCINOMA, PANCREAS (ICD-157.9)   <<SEE ABOVE>> new diagnosis 11/11 ~  HOSP DCSummary 02/22/10 w/ Tx to [facility] reviewed... ~  subseq follow up & management by DrOdogwu in [place] & DrONeil at [facility]... ~  8/12: she reports that when last imaged they could not see the mass & they continue to follow scans every 4-90mo... ~  10/12:  Note from DrOdogwu reviewed> basilic vein thrombosis & they plan short-term anticoag; she is felt to have stable dis- CT scans done at [facility]...  DEGENERATIVE JOINT DISEASE (ICD-715.90) & BACK PAIN, LUMBAR (ICD-724.2) - w/ hx spinal stenosis and prev eval by DrWillis... daughter prev requested outpt physical therapy w/ hot pack and stimulation therapy... she takes Glucosamine/ Chondroitin daily & Tylenol as needed... notes right knee pain & needs ortho eval... saw DrLucey, then DrDeveshwar- w/ Dx of seroneg RA >>  SERONEGATIVE RHEUMATOID ARTHRITIS (ICD-714.0) - diagnosed by DrDeveshwar and treated w/ PREDNISONE tapering schedule that she was confused about, & PLAQUENIL 200mg Bid... she switched to [facility] & was changed to Pred, MTX, & Folic Acid... ~   May10: right hand pain/ swelling worse since she stopped cortisone therapy per DrD... we re-started Medrol 8mg /d w/ improvement. ~  labs here 5/10 showed Chem-OK, CBC-12.4, Uric-4.3, Sed-18... ~  6/10 saw DrAnderson w/ change in regimen to PRED 5mg tabs- 3/d now, MTX 2.5mg - 6tabs/wk, Folic 1mg /d... ~  7/11: she is currently taking Pred5mg - 1.5tabs + MTX 6tabs/wk... ~  8/12:  She is currently off the MTX from [facility] (?stopped 11/11), and taking Pred 5mg /d & stable she says...  OSTEOPENIA (ICD-733.90) - she takes Calcium supplement, MVI, Vit D 1000 u daily & Glucosamine... ~  BMD in 2003 was OK w/ TScores > -1...  ~  f/u BMD 11/09 showed TScores +1.5 in Spine, & -0.9 in right FemNeck... rec- continue calcium/ vits. ~  Vit D level 11/09 = 33... encouraged to continue OTC Vit  D supplements. ~  labs 5/10 showed Vit D level = 39... rec> 1000 u daily.  ARTERIOSCLEROTIC DEMENTIA (ICD-290.40) - new prob per family in 2010... w/ MMSE showing 0/3 objects at 2 min after distraction... on ASA 81mg /d & ARICEPT 5mg /d started- they feel that this is helping... ~  7/10:  reviewed med Rx & very important visitng nurse help w/ meds. ~  7/11:  they are content to continue the Aricept at 5mg  dose. ~  8/12:  ditto  Hx of PARESTHESIA (ICD-782.0) - prev eval by neuro in [place] in 2006 was neg...she takes MVI daily.  ANXIETY (ICD-300.00)  MULTIPLE  MYELOMA (ICD-203.00) - labs showing low IgM level & referred to Heme/Onc w/ eval DrOdogwu showing a "smoldering mult myeloma"- SPE did not show an MSpike, but IEP showed a monoclonal IgA lambda protein (light chains were neg).Marland KitchenMarland KitchenBone marrow showed hypercellular marrow w/ 20% plasma cells... decision made for observational protocol... ~  6/11:  [facility] from [facility]> myeloma markers are stable & Q28mo observation to continue. ~  12/11:  NOTE> she was Dx w/ pancreatic cancer last month... ~  Labs followed by DrOdogwu, her hematologist/ oncologist...  Health  Maintenance: ~  GI:   ~  GYN:   ~  Immunizations: she is rec to get the FLu shot yearly;  She had a PNEUMOVAX in 2007;  ?last Tetanus vaccine...   Past Surgical History  Procedure Date  . Appendectomy   . Abdominal hysterectomy   . Common dile duct stent 02/2010    for pancreatic cancer    Outpatient Encounter Prescriptions as of 01/18/2011  Medication Sig Dispense Refill  . acetaminophen (TYLENOL) 325 MG tablet Take 650 mg by mouth every 6 (six) hours as needed.        . ALPRAZolam (XANAX) 0.5 MG tablet Take 0.5 mg by mouth 3 (three) times daily as needed.        Marland Kitchen aspirin 81 MG tablet Take 81 mg by mouth daily.        . B Complex Vitamins (VITAMIN B COMPLEX PO) daily       . Cholecalciferol (VITAMIN D) 1000 UNITS capsule Take 1,000 Units by mouth daily.        Marland Kitchen dexlansoprazole (DEXILANT) 60 MG capsule Take 60 mg by mouth daily.        Marland Kitchen donepezil (ARICEPT) 5 MG tablet TAKE 1 TABLET EVERY DAY  90 tablet  1  . megestrol (MEGACE) 20 MG tablet TAKE 1 TABLET EVERY DAY  90 tablet  1  . Misc. Devices (WALKER) MISC Rolling walker with seat Dx 715.90 & 714.0  1 each  0  . Multiple Vitamin (MULTIVITAMIN) capsule Take 1 capsule by mouth daily. velocity       . NEOMYCIN-POLYMYXIN-HC, OTIC, (CORTISPORIN) 1 % SOLN Place 3 drops into the right ear 3 (three) times daily.  1 Bottle  0  . polyethylene glycol powder (MIRALAX) powder Take 17 g by mouth daily.        . predniSONE (DELTASONE) 5 MG tablet TAKE 1 TABLETS EVERY DAY  60 tablet  2  . simethicone (MYLICON) 125 MG chewable tablet Chew 125 mg by mouth every 6 (six) hours as needed.        . warfarin (COUMADIN) 4 MG tablet Take 4 mg by mouth. As directed       . guaifenesin (MUCUS RELIEF) 400 MG TABS Take 400 mg by mouth every 4 (four) hours as needed.        Marland Kitchen  predniSONE (DELTASONE) 5 MG tablet Take 1 tablet (5 mg total) by mouth daily.  30 tablet  11    No Known Allergies   Current Medications, Allergies, Past Medical History, Past  Surgical History, Family History, and Social History were reviewed in [employer] record.    Review of Systems       See HPI - all other systems neg except as noted... The patient complains of anorexia, weight loss, decreased hearing, dyspnea on exertion, abdominal pain, muscle weakness, and difficulty walking.  The patient denies fever, weight gain, vision loss, hoarseness, chest pain, syncope, peripheral edema, prolonged cough, headaches, hemoptysis, melena, hematochezia, severe indigestion/heartburn, hematuria, incontinence, suspicious skin lesions, transient blindness, depression, unusual weight change, abnormal bleeding, enlarged lymph nodes, and angioedema.   Objective:   Physical Exam     WD, WN, 77 y/o BF in NAD... weak & she has obviously lost weight. Vital signs:  Reviewed... GENERAL:  Alert, pleasant & cooperative... HEENT:  [place]/AT, EOM-wnl, PERRLA, EACs-clear, TMs- right TM red w/ effusion, NOSE-clear, THROAT-clear & wnl. NECK:  Supple w/ fairROM; no JVD; normal carotid impulses w/o bruits; no thyromegaly or nodules palpated; no lymphadenopathy. CHEST:  velcro rales about 1/3 way up, no wheezing, no rhonchi, no signs of consolidation... HEART:  Regular Rhythm; without murmurs/ rubs/ or gallops heard... ABDOMEN:  Soft & mild epig tender on palp; normal bowel sounds; no organomegaly or masses detected. EXT:  mild arthritic changes in hands; +arthritic changes in right knee & shoulder; no varicose veins, +venous insuffic, no edema. NEURO:  CN's intact; no focal neuro deficits... DERM:  No lesions noted; no rash etc...   Assessment & Plan:   Dyspnea/ weakness>  reviewed w/ the pt & daughter; we discussed the labs etc; rec to incr exercise program, get moving, silver sneakers etc...  Right Ear Problem>  prev OM treated & f/u eval by Gayla Medicus showed a benign growth (?vasc tumor) on ear drum, he rec observation...  Pancreatic Cancer>  Followed & managed by  DrOdogwu in [place] & DrONeil at [facility];  Daughter asked to remind the specialists to keep me in the loop w/ notes sent...  Mult Myeloma>  Also followed by DrOdogwu & labs in [facility] are reviewed....  PULM FIBROSIS>  Clinically stable on her low dose 5mg  Pred daily, CXR stable fibrosis pattern, no change; Continue same for now in light of her other more serious medical issues & has is given HYCODAN for cough prn.  DJD>  She is followed by Elita Quick; in wheelchair today w/ mult issues...  Dementia>  She is holding her own according to the daughter,  They want to continue the lower dose of Aricept as is.Marland KitchenMarland Kitchen

## 2011-01-18 NOTE — Patient Instructions (Signed)
Today we updated your med list in our EPIC system...    Continue your current medications the same...    We wrote a new prescription for HYCODAN cough syrup to use every 6H as needed for cough...  It would be helpful to add MUCINEX 1-2 tabs twice daily w/ lots of fluids...  Call for any questions...  Let's plana follow up visit in 4 months or so.Marland KitchenMarland Kitchen

## 2011-01-24 ENCOUNTER — Ambulatory Visit: Payer: Self-pay | Admitting: Pharmacist

## 2011-01-24 DIAGNOSIS — I82629 Acute embolism and thrombosis of deep veins of unspecified upper extremity: Secondary | ICD-10-CM

## 2011-01-24 NOTE — Progress Notes (Signed)
Spoke with Tresa Endo, RN with Home Health.  Pts coverage with Home Health will expire soon since they do not cover INR sticks and medication management exclusively.  She will visit the patient again next week as requested since her INR remains very volatile.  W[place] Sink (patient's daughter) will be back in town next week, and can schedule her next INR in the [facility].

## 2011-01-31 ENCOUNTER — Encounter: Payer: Self-pay | Admitting: Pulmonary Disease

## 2011-01-31 ENCOUNTER — Ambulatory Visit: Payer: Self-pay | Admitting: Hematology and Oncology

## 2011-01-31 DIAGNOSIS — I82629 Acute embolism and thrombosis of deep veins of unspecified upper extremity: Secondary | ICD-10-CM

## 2011-01-31 LAB — POCT INR: INR: 4.3

## 2011-02-01 ENCOUNTER — Telehealth: Payer: Self-pay | Admitting: Hematology and Oncology

## 2011-02-01 NOTE — Telephone Encounter (Signed)
Added lb/coumadin clinic appt for 11/27 @ 3pm. D/t per coumadin clinic.

## 2011-02-08 ENCOUNTER — Telehealth: Payer: Self-pay | Admitting: Pharmacist

## 2011-02-08 ENCOUNTER — Telehealth: Payer: Self-pay | Admitting: Pulmonary Disease

## 2011-02-08 ENCOUNTER — Other Ambulatory Visit (HOSPITAL_BASED_OUTPATIENT_CLINIC_OR_DEPARTMENT_OTHER): Payer: Medicare Other | Admitting: Lab

## 2011-02-08 ENCOUNTER — Ambulatory Visit (HOSPITAL_BASED_OUTPATIENT_CLINIC_OR_DEPARTMENT_OTHER): Payer: Self-pay | Admitting: Pharmacist

## 2011-02-08 ENCOUNTER — Other Ambulatory Visit: Payer: Self-pay | Admitting: Hematology and Oncology

## 2011-02-08 ENCOUNTER — Ambulatory Visit: Payer: Medicare Other

## 2011-02-08 ENCOUNTER — Encounter: Payer: Self-pay | Admitting: Neurology

## 2011-02-08 DIAGNOSIS — I82629 Acute embolism and thrombosis of deep veins of unspecified upper extremity: Secondary | ICD-10-CM

## 2011-02-08 DIAGNOSIS — F0150 Vascular dementia without behavioral disturbance: Secondary | ICD-10-CM

## 2011-02-08 DIAGNOSIS — Z452 Encounter for adjustment and management of vascular access device: Secondary | ICD-10-CM

## 2011-02-08 DIAGNOSIS — C250 Malignant neoplasm of head of pancreas: Secondary | ICD-10-CM

## 2011-02-08 LAB — PROTIME-INR: Protime: 28.8 Seconds — ABNORMAL HIGH (ref 10.6–13.4)

## 2011-02-08 LAB — POCT INR: INR: 2.4

## 2011-02-08 NOTE — Progress Notes (Signed)
Pt was able to make her Coumadin Clinic appt from the eye doctor.  Possible cataract surgery or get stronger lenses for correction of cataracts per pts dtr, [name]. [place] Sink plans to be in town for as long as she needs to be to care for her mother so she will bring her mother to the appts here when we need her to.

## 2011-02-08 NOTE — Telephone Encounter (Signed)
Per SN---ok for pt to be set up to see Dr. Modesto Charon for neuro eval.  Order has been placed and i called and spoke with pts daughter [place] Sink and she is aware that we will set up appt for pt and call once this has been done. Pt is to keep her appt with SN in December.

## 2011-02-08 NOTE — Telephone Encounter (Signed)
Called and spoke with pts daughter [place] Sink and she stated that she wants SN to refer her mother to a neurologist for eval--she stated that she is concerned since the pt has done the chemo and treatments that her memory is just not as it used to be.  She stated that the pt keeps repeating questions to the daughter that the daughter says that she has answered many times but the pt keeps asking her the same question.  She is requesting that pt have appt with neuro for this.  She stated that pt has seen neuro back in 2009?, and pt does have appt with SN on 12/5.  SN please advise. thanks

## 2011-02-11 ENCOUNTER — Other Ambulatory Visit: Payer: Self-pay | Admitting: Hematology and Oncology

## 2011-02-11 ENCOUNTER — Telehealth: Payer: Self-pay | Admitting: Pulmonary Disease

## 2011-02-11 DIAGNOSIS — I82629 Acute embolism and thrombosis of deep veins of unspecified upper extremity: Secondary | ICD-10-CM

## 2011-02-11 NOTE — Telephone Encounter (Signed)
I spoke with pt daughter [place] Sink and she cancelled pt's apt for next week with SN. She states pt has a lot of appointments next week and she states it's hard on her trying to take her mother everywhere she needs to go. She states the pt has nurse coming in every week. [place] Sink is wanting to know does pt need to reschedule or can she just come in at her next OV 05/18/11 at 11:30. Please advise Dr. Kriste Basque, thanks

## 2011-02-11 NOTE — Telephone Encounter (Signed)
Per SN: yes, it's okay to cancell and we'll plan to see her in the spring.  Altamese Cabal Christmas and call if we can be of any assistance.  Called spoke with patient's daughter Cade Sink, advised of SN's recs.  [place] Sink thanked [place] and will call if needed before appt on 3.6.13.

## 2011-02-12 ENCOUNTER — Encounter: Payer: Self-pay | Admitting: *Deleted

## 2011-02-15 ENCOUNTER — Telehealth: Payer: Self-pay | Admitting: Pulmonary Disease

## 2011-02-15 ENCOUNTER — Encounter: Payer: Self-pay | Admitting: *Deleted

## 2011-02-15 NOTE — Telephone Encounter (Signed)
I spoke with daughter and she states that she needs SN to submit a letter to [facility] about pt's terminal illness and needs this by noon today. She states the hearing is to avoid having to turn in pt's liscense plates. [place] Sink states they had a lapse in insurance and this is the reason they are trying to have pt's plates turned in. [place] Sink states if she does this then they will have no transportation. I advised Shirlee More is seeing pt's this morning and requested I send this to SN ASAP. Please advise Dr. Kriste Basque, thanks

## 2011-02-15 NOTE — Telephone Encounter (Signed)
Fax # is [phone] attn: office turner

## 2011-02-15 NOTE — Telephone Encounter (Signed)
Letter has been completed and faxed to number given by pts daughter.

## 2011-02-15 NOTE — Telephone Encounter (Signed)
Letter has been done and printed.  Will need to daughter to supply a fax number to fax this letter to unless she wants to come by and pick this up.  thanks

## 2011-02-16 ENCOUNTER — Ambulatory Visit: Payer: Medicare Other

## 2011-02-16 ENCOUNTER — Other Ambulatory Visit (HOSPITAL_BASED_OUTPATIENT_CLINIC_OR_DEPARTMENT_OTHER): Payer: Medicare Other | Admitting: Lab

## 2011-02-16 ENCOUNTER — Other Ambulatory Visit: Payer: Self-pay | Admitting: Hematology and Oncology

## 2011-02-16 ENCOUNTER — Ambulatory Visit (HOSPITAL_BASED_OUTPATIENT_CLINIC_OR_DEPARTMENT_OTHER): Payer: Self-pay | Admitting: Pharmacist

## 2011-02-16 ENCOUNTER — Ambulatory Visit (HOSPITAL_COMMUNITY)
Admission: RE | Admit: 2011-02-16 | Discharge: 2011-02-16 | Disposition: A | Discharge status: Home / Self Care | Payer: Medicare Other | Source: Ambulatory Visit | Attending: Hematology and Oncology | Admitting: Hematology and Oncology

## 2011-02-16 ENCOUNTER — Ambulatory Visit: Payer: Medicare Other | Admitting: Pulmonary Disease

## 2011-02-16 ENCOUNTER — Ambulatory Visit (HOSPITAL_BASED_OUTPATIENT_CLINIC_OR_DEPARTMENT_OTHER): Payer: Medicare Other | Admitting: Hematology and Oncology

## 2011-02-16 VITALS — BP 116/74 | HR 69 | Temp 97.4°F | Ht 64.0 in | Wt 147.9 lb

## 2011-02-16 DIAGNOSIS — Z86718 Personal history of other venous thrombosis and embolism: Secondary | ICD-10-CM

## 2011-02-16 DIAGNOSIS — D638 Anemia in other chronic diseases classified elsewhere: Secondary | ICD-10-CM

## 2011-02-16 DIAGNOSIS — C787 Secondary malignant neoplasm of liver and intrahepatic bile duct: Secondary | ICD-10-CM

## 2011-02-16 DIAGNOSIS — Z452 Encounter for adjustment and management of vascular access device: Secondary | ICD-10-CM

## 2011-02-16 DIAGNOSIS — C250 Malignant neoplasm of head of pancreas: Secondary | ICD-10-CM

## 2011-02-16 DIAGNOSIS — R5383 Other fatigue: Secondary | ICD-10-CM

## 2011-02-16 DIAGNOSIS — O2230 Deep phlebothrombosis in pregnancy, unspecified trimester: Secondary | ICD-10-CM

## 2011-02-16 DIAGNOSIS — I82629 Acute embolism and thrombosis of deep veins of unspecified upper extremity: Secondary | ICD-10-CM

## 2011-02-16 DIAGNOSIS — M7989 Other specified soft tissue disorders: Secondary | ICD-10-CM

## 2011-02-16 DIAGNOSIS — R5381 Other malaise: Secondary | ICD-10-CM

## 2011-02-16 DIAGNOSIS — C7A098 Malignant carcinoid tumors of other sites: Secondary | ICD-10-CM

## 2011-02-16 DIAGNOSIS — C7B8 Other secondary neuroendocrine tumors: Secondary | ICD-10-CM

## 2011-02-16 LAB — COMPREHENSIVE METABOLIC PANEL
AST: 25 U/L (ref 0–37)
Albumin: 3.1 g/dL — ABNORMAL LOW (ref 3.5–5.2)
Alkaline Phosphatase: 97 U/L (ref 39–117)
BUN: 11 mg/dL (ref 6–23)
Glucose, Bld: 97 mg/dL (ref 70–99)
Potassium: 3.5 mEq/L (ref 3.5–5.3)
Total Bilirubin: 0.7 mg/dL (ref 0.3–1.2)

## 2011-02-16 LAB — PROTIME-INR
INR: 2.7 (ref 2.00–3.50)
Protime: 32.4 Seconds — ABNORMAL HIGH (ref 10.6–13.4)

## 2011-02-16 LAB — CBC WITH DIFFERENTIAL/PLATELET
BASO%: 0 % (ref 0.0–2.0)
Basophils Absolute: 0 10*3/uL (ref 0.0–0.1)
EOS%: 1.3 % (ref 0.0–7.0)
HCT: 32.7 % — ABNORMAL LOW (ref 34.8–46.6)
HGB: 10.9 g/dL — ABNORMAL LOW (ref 11.6–15.9)
MCH: 31.4 pg (ref 25.1–34.0)
MCHC: 33.4 g/dL (ref 31.5–36.0)
MCV: 94.1 fL (ref 79.5–101.0)
MONO%: 7.5 % (ref 0.0–14.0)
NEUT%: 86.8 % — ABNORMAL HIGH (ref 38.4–76.8)
RDW: 15.2 % — ABNORMAL HIGH (ref 11.2–14.5)
lymph#: 0.3 10*3/uL — ABNORMAL LOW (ref 0.9–3.3)

## 2011-02-16 NOTE — Progress Notes (Signed)
CC:   Heather Dorsey. Heather Basque, MD Heather Close, MD Heather Nida, MD Heather Jo, MD  IDENTIFYING STATEMENT:  The patient is an 77 year old woman with an adenocarcinoma of the head of the pancreas who presents for followup. Heather Dorsey is seen here quite frequently at the Coumadin clinic.  She has a history of a superficial thrombus in the basilic vein for which she is on anticoagulation.  This morning, she received a Doppler of the right upper extremity which showed no evidence of DVT or superficial thrombosis.  Heather Dorsey is doing has yet to followup with Heather Dorsey at [facility].  She receives periodic followup and CT scans there. She reports she is eating well and has regular bowel movements, although she is more fatigued than usual.  She denies pain, nausea, or vomiting.  MEDICATIONS:  Reviewed and updated.  PHYSICAL EXAMINATION:  General Appearance:  Alert and oriented x3. Vital Signs:  Pulse 69.  Blood pressure 116/74.  Temperature 97.4. Respirations 18.  Weight 147 pounds.  HEENT:  Head is atraumatic, normocephalic.  Sclerae are anicteric.  Mouth is moist.  Chest:  Clear. CVS:  Unremarkable.  Abdomen:  Soft.  No masses.  Bowel sounds present. Extremities:  No calf tenderness.  Pulses are present and symmetrical.  LABORATORY DATA:  On 02/16/2011, white cell count 6.5, hemoglobin 10.9, hematocrit 32.7, platelets 149.  CMET is pending.  IMPRESSION AND PLAN:  Heather Dorsey is an 77 year old woman with: 1. History of a localized adenocarcinoma of the head of the pancreas,     status post biliary stent placement. on 02/17/2000.  Status post     single-agent gemcitabine from 03/24/2010 through 06/01/2010.  She     is currently undergoing surveillance, receiving periodic scans at     [facility].  She will be following up at [facility] later this month with scans. 2. History of a right basilic vein thrombosis for which she is     anticoagulated with Coumadin.  A Doppler of upper extremity was  negative.  The CT scans continue to demonstrate no progression of     disease.  We will discontinue anticoagulation. 3. Anemia of chronic disease.  The patient has received Aranesp in the     past.  She is not a current candidate as her hemoglobin is greater     than 10.  We will continue to monitor. 4. We will await results of upcoming scans.  We will scheduled a     followup visit in four months or sooner.    ______________________________ Heather Dorsey, M.D. LIO/MEDQ  D:  02/16/2011  T:  02/16/2011  Job:  [id number]

## 2011-02-16 NOTE — Progress Notes (Signed)
PRELIMINARY  PRELIMINARY  PRELIMINARY  PRELIMINARY  Right upper extremity venous duplex  completed.    Preliminary report:  Right:  No evidence of DVT or superficial thrombosis.    Terance Hart, RVT 02/16/2011 10:59 AM

## 2011-02-16 NOTE — Progress Notes (Signed)
This office note has been dictated.

## 2011-02-16 NOTE — Progress Notes (Signed)
No issues to report.  Doing fine on current dose of Coumadin. Pt is due to complete 3 months of planned anticoagulation around 03/10/11 unless Dr. Dalene Carrow decides otherwise. Per pts dtr, Heather Dorsey, results from vascular study done this morning showed no clot. Heather Dorsey, Pharm.D.

## 2011-02-17 ENCOUNTER — Telehealth: Payer: Self-pay | Admitting: *Deleted

## 2011-02-17 NOTE — Telephone Encounter (Signed)
Called Aurea Graff @ Dr[facility] Dor[facility]'s  Office  And left message on voice mail re:   Pt needs f/u appt along with repeat CT scans for pt   As per Dr. Lonell Face instructions.    Per md,   Daughter  Stewardson Sink had called Dr. Merlinda Frederick  Office to obtain f/u appt unsuccessfully.     Left nurse's direct phone number with  Aurea Graff  For a call back. Joan's    Phone         [phone].

## 2011-02-18 ENCOUNTER — Telehealth: Payer: Self-pay | Admitting: Hematology and Oncology

## 2011-02-18 ENCOUNTER — Telehealth: Payer: Self-pay | Admitting: Nurse Practitioner

## 2011-02-18 ENCOUNTER — Other Ambulatory Visit: Payer: Self-pay | Admitting: Hematology and Oncology

## 2011-02-18 DIAGNOSIS — C9000 Multiple myeloma not having achieved remission: Secondary | ICD-10-CM

## 2011-02-18 DIAGNOSIS — C259 Malignant neoplasm of pancreas, unspecified: Secondary | ICD-10-CM

## 2011-02-18 NOTE — Telephone Encounter (Signed)
Pt's daughter, Heather Dorsey, called to let Coumadin Clinic know that she got busy on Wednesday and forgot to fill Heather Dorsey's pill box with half tablets for the week.  She missed her Coumadin dose on Wednesday and Thursday.  [name] will fill her box today, so she will take the whole 4mg  tablet today as she should, then resume 2mg  daily.  I assured daughter that I will make a note of the missed doses.  [name] is unlikely to be harmed by the omitted doses.  Her doppler did not show any remaining traces of clot, and she may be taken off Coumadin entirely at the end of the month unless otherwise directed by Dr. Dalene Carrow.  Her last INR on 02/16/11 was 2.7, and her next INR appt is 03/02/11.

## 2011-02-18 NOTE — Telephone Encounter (Signed)
Received call from Aurea Graff at Dr. Merlinda Frederick office re: pt has appts for CT scan and to see Dr. Carmon Ginsberg scheduled for 03/25/11.  Aurea Graff stated she would call and remind daughter and patient of appointments.

## 2011-02-18 NOTE — Telephone Encounter (Signed)
lmonvm advising the pt of her aPPTS FOR April 2013

## 2011-02-28 ENCOUNTER — Telehealth: Payer: Self-pay | Admitting: Pulmonary Disease

## 2011-02-28 ENCOUNTER — Telehealth: Payer: Self-pay | Admitting: Pharmacist

## 2011-02-28 NOTE — Telephone Encounter (Signed)
Received call from Ernie Avena, Heather Dorsey's daughter.  Heather Dorsey is taking Coumadin 2mg  daily except 4mg  on Fridays.  Heather Dorsey discovered on Saturday, 02/26/11 that her mother didn't take her Coumadin on Friday (02/25/11). Heather Dorsey called on-call MD. She was instructed to take the 4mg  on Sat (02/26/11) then continue with the 2mg  daily. On 02/27/11, pt had small amounts of bright, red blood in her stool and on the tissue paper x 2. Heather Dorsey called on-call MD and was told small amounts of bright, red blood can be usual on Coumadin or related to BM or hemorrhoids. It is ok. If it changes to large amounts of blood or if her stool is tar-colored black. She should call back or go to ED. No further issues with blood in stool as of today. Heather Dorsey also stated that her mom is very fatigued, always in the bed. Very tired. She gets out of bed to eat and go to the bathroom. Last Hg/Hct on 02/16/11 = 10.9/32.7. No changes in her medications. She questions whether her iron could be low. She plans to discuss getting iron levels with her mom's primary care provider. Heather Dorsey also asked if iron can be take with Coumadin. I told her, yes, iron tablets can be taken with Coumadin. They usually don't interact with the INR. We also discussed that there are various iron preparations, some are easier on the the stomach. Iron can cause constipation. Heather Dorsey aware.

## 2011-02-28 NOTE — Telephone Encounter (Signed)
I spoke with daughter she states she is wanting to know if pt had her iron levels drawn recently. I advised her it looks like oncology drawn labs and would need to contact them regarding results since they are the once who ordered it. She voiced her understanding and needed nothing further

## 2011-03-02 ENCOUNTER — Ambulatory Visit (HOSPITAL_BASED_OUTPATIENT_CLINIC_OR_DEPARTMENT_OTHER): Payer: Medicare Other | Admitting: Hematology and Oncology

## 2011-03-02 ENCOUNTER — Other Ambulatory Visit (HOSPITAL_BASED_OUTPATIENT_CLINIC_OR_DEPARTMENT_OTHER): Payer: Medicare Other | Admitting: Lab

## 2011-03-02 ENCOUNTER — Ambulatory Visit: Payer: Medicare Other

## 2011-03-02 DIAGNOSIS — I82629 Acute embolism and thrombosis of deep veins of unspecified upper extremity: Secondary | ICD-10-CM

## 2011-03-02 LAB — PROTIME-INR: INR: 2.3 (ref 2.00–3.50)

## 2011-03-02 NOTE — Progress Notes (Signed)
INR therapeutic.  Pt has noticed some blood in her stool again on Sunday - amount unspecified.  Seen in stool and on toilet tissue.  No problems with bleeding since.  No changes in medications since last discussed. Added OTC iron daily to her home medications since it was not entered previously.  Will have pt continue current dose of Coumadin, and arrange another INR check in 3 weeks (in the event Dr. Dalene Carrow opts to continue her Coumadin past the three-month point).  Pt will have completed 3 months of anticoagulation around 03/10/11-03/14/11.

## 2011-03-10 ENCOUNTER — Telehealth: Payer: Self-pay | Admitting: Hematology and Oncology

## 2011-03-10 NOTE — Telephone Encounter (Signed)
Patient approved for 100% discount, for family of 1, income [phone], 03/10/11 to 03/09/12

## 2011-03-22 ENCOUNTER — Other Ambulatory Visit: Payer: Self-pay

## 2011-03-22 ENCOUNTER — Inpatient Hospital Stay (HOSPITAL_COMMUNITY)
Admission: EM | Admit: 2011-03-22 | Discharge: 2011-03-28 | DRG: 378 | Disposition: A | Discharge status: Skilled Nursing Facility | Payer: Medicare Other | Attending: Internal Medicine | Admitting: Internal Medicine

## 2011-03-22 ENCOUNTER — Ambulatory Visit: Payer: Medicare Other | Admitting: Neurology

## 2011-03-22 ENCOUNTER — Emergency Department (HOSPITAL_COMMUNITY): Payer: Medicare Other

## 2011-03-22 ENCOUNTER — Telehealth: Payer: Self-pay | Admitting: Pulmonary Disease

## 2011-03-22 ENCOUNTER — Encounter (HOSPITAL_COMMUNITY): Payer: Self-pay | Admitting: Emergency Medicine

## 2011-03-22 DIAGNOSIS — D696 Thrombocytopenia, unspecified: Secondary | ICD-10-CM | POA: Diagnosis present

## 2011-03-22 DIAGNOSIS — C259 Malignant neoplasm of pancreas, unspecified: Secondary | ICD-10-CM | POA: Diagnosis present

## 2011-03-22 DIAGNOSIS — I82629 Acute embolism and thrombosis of deep veins of unspecified upper extremity: Secondary | ICD-10-CM | POA: Diagnosis present

## 2011-03-22 DIAGNOSIS — R7989 Other specified abnormal findings of blood chemistry: Secondary | ICD-10-CM | POA: Diagnosis present

## 2011-03-22 DIAGNOSIS — K625 Hemorrhage of anus and rectum: Principal | ICD-10-CM | POA: Diagnosis present

## 2011-03-22 DIAGNOSIS — R0602 Shortness of breath: Secondary | ICD-10-CM | POA: Diagnosis present

## 2011-03-22 DIAGNOSIS — K922 Gastrointestinal hemorrhage, unspecified: Secondary | ICD-10-CM | POA: Diagnosis present

## 2011-03-22 DIAGNOSIS — Z86718 Personal history of other venous thrombosis and embolism: Secondary | ICD-10-CM

## 2011-03-22 DIAGNOSIS — C7800 Secondary malignant neoplasm of unspecified lung: Secondary | ICD-10-CM | POA: Diagnosis present

## 2011-03-22 DIAGNOSIS — D638 Anemia in other chronic diseases classified elsewhere: Secondary | ICD-10-CM | POA: Diagnosis present

## 2011-03-22 DIAGNOSIS — R7401 Elevation of levels of liver transaminase levels: Secondary | ICD-10-CM

## 2011-03-22 DIAGNOSIS — R110 Nausea: Secondary | ICD-10-CM

## 2011-03-22 DIAGNOSIS — R109 Unspecified abdominal pain: Secondary | ICD-10-CM | POA: Diagnosis present

## 2011-03-22 DIAGNOSIS — D126 Benign neoplasm of colon, unspecified: Secondary | ICD-10-CM | POA: Diagnosis present

## 2011-03-22 DIAGNOSIS — R791 Abnormal coagulation profile: Secondary | ICD-10-CM

## 2011-03-22 DIAGNOSIS — M1990 Unspecified osteoarthritis, unspecified site: Secondary | ICD-10-CM | POA: Diagnosis present

## 2011-03-22 DIAGNOSIS — F29 Unspecified psychosis not due to a substance or known physiological condition: Secondary | ICD-10-CM | POA: Diagnosis not present

## 2011-03-22 DIAGNOSIS — J8410 Pulmonary fibrosis, unspecified: Secondary | ICD-10-CM | POA: Diagnosis present

## 2011-03-22 DIAGNOSIS — J42 Unspecified chronic bronchitis: Secondary | ICD-10-CM | POA: Diagnosis present

## 2011-03-22 DIAGNOSIS — M069 Rheumatoid arthritis, unspecified: Secondary | ICD-10-CM | POA: Diagnosis present

## 2011-03-22 DIAGNOSIS — C9000 Multiple myeloma not having achieved remission: Secondary | ICD-10-CM | POA: Diagnosis present

## 2011-03-22 DIAGNOSIS — E876 Hypokalemia: Secondary | ICD-10-CM | POA: Diagnosis present

## 2011-03-22 DIAGNOSIS — J40 Bronchitis, not specified as acute or chronic: Secondary | ICD-10-CM | POA: Diagnosis present

## 2011-03-22 HISTORY — DX: Other specified postprocedural states: R11.2

## 2011-03-22 HISTORY — DX: Other specified postprocedural states: Z98.890

## 2011-03-22 LAB — CBC
HCT: 32 % — ABNORMAL LOW (ref 36.0–46.0)
HCT: 25.3 % — ABNORMAL LOW (ref 36.0–46.0)
Hemoglobin: 10.9 g/dL — ABNORMAL LOW (ref 12.0–15.0)
Hemoglobin: 8 g/dL — ABNORMAL LOW (ref 12.0–15.0)
MCH: 30.6 pg (ref 26.0–34.0)
MCH: 30.9 pg (ref 26.0–34.0)
MCH: 30.2 pg (ref 26.0–34.0)
MCHC: 34.1 g/dL (ref 30.0–36.0)
MCHC: 35.2 g/dL (ref 30.0–36.0)
MCHC: 34.3 g/dL (ref 30.0–36.0)
MCV: 89.9 fL (ref 78.0–100.0)
MCV: 87.8 fL (ref 78.0–100.0)
MCV: 87.9 fL (ref 78.0–100.0)
Platelets: 193 10*3/uL (ref 150–400)
Platelets: 115 10*3/uL — ABNORMAL LOW (ref 150–400)
RBC: 3.56 MIL/uL — ABNORMAL LOW (ref 3.87–5.11)
RBC: 2.65 MIL/uL — ABNORMAL LOW (ref 3.87–5.11)
RDW: 16.8 % — ABNORMAL HIGH (ref 11.5–15.5)
RDW: 16.7 % — ABNORMAL HIGH (ref 11.5–15.5)
WBC: 11.5 10*3/uL — ABNORMAL HIGH (ref 4.0–10.5)
WBC: 10.8 10*3/uL — ABNORMAL HIGH (ref 4.0–10.5)

## 2011-03-22 LAB — COMPREHENSIVE METABOLIC PANEL
ALT: 181 U/L — ABNORMAL HIGH (ref 0–35)
AST: 408 U/L — ABNORMAL HIGH (ref 0–37)
Albumin: 3.3 g/dL — ABNORMAL LOW (ref 3.5–5.2)
Alkaline Phosphatase: 320 U/L — ABNORMAL HIGH (ref 39–117)
BUN: 10 mg/dL (ref 6–23)
CO2: 17 mEq/L — ABNORMAL LOW (ref 19–32)
Calcium: 9.3 mg/dL (ref 8.4–10.5)
Chloride: 98 mEq/L (ref 96–112)
Creatinine, Ser: 0.55 mg/dL (ref 0.50–1.10)
GFR calc Af Amer: >90 mL/min (ref >90)
GFR calc non Af Amer: 83 mL/min — ABNORMAL LOW (ref >90)
Glucose, Bld: 159 mg/dL — ABNORMAL HIGH (ref 70–99)
Potassium: 3.7 mEq/L (ref 3.5–5.1)
Sodium: 132 mEq/L — ABNORMAL LOW (ref 135–145)
Total Bilirubin: 1.6 mg/dL — ABNORMAL HIGH (ref 0.3–1.2)
Total Protein: 7.1 g/dL (ref 6.0–8.3)

## 2011-03-22 LAB — POCT I-STAT TROPONIN I: Troponin i, poc: 0 ng/mL (ref 0.00–0.08)

## 2011-03-22 LAB — TYPE AND SCREEN
ABO/RH(D): B POS
Antibody Screen: NEGATIVE
Unit division: 0

## 2011-03-22 LAB — PREPARE FRESH FROZEN PLASMA: Unit division: 0

## 2011-03-22 LAB — PROTIME-INR
INR: 3.62 — ABNORMAL HIGH (ref 0.00–1.49)
Prothrombin Time: 36.6 seconds — ABNORMAL HIGH (ref 11.6–15.2)

## 2011-03-22 LAB — ABO/RH: ABO/RH(D): B POS

## 2011-03-22 LAB — CK TOTAL AND CKMB (NOT AT ARMC)
CK, MB: 2.2 ng/mL (ref 0.3–4.0)
Relative Index: 2.2 (ref 0.0–2.5)

## 2011-03-22 LAB — TSH: TSH: 1.44 u[IU]/mL (ref 0.350–4.500)

## 2011-03-22 LAB — LIPASE, BLOOD: Lipase: 25 U/L (ref 11–59)

## 2011-03-22 LAB — PREPARE RBC (CROSSMATCH)

## 2011-03-22 LAB — MRSA PCR SCREENING: MRSA by PCR: NEGATIVE

## 2011-03-22 MED ORDER — ONDANSETRON HCL 4 MG/2ML IJ SOLN
4.0000 mg | Freq: Once | INTRAMUSCULAR | Status: AC
  Administered 2011-03-22: 4 mg via INTRAVENOUS
  Filled 2011-03-22: qty 2

## 2011-03-22 MED ORDER — DONEPEZIL HCL 10 MG PO TABS
10.0000 mg | ORAL_TABLET | Freq: Every day | ORAL | Status: DC
  Administered 2011-03-22 – 2011-03-27 (×6): 10 mg via ORAL
  Filled 2011-03-22 (×8): qty 1

## 2011-03-22 MED ORDER — IOHEXOL 300 MG/ML  SOLN
100.0000 mL | Freq: Once | INTRAMUSCULAR | Status: AC | PRN
  Administered 2011-03-22: 100 mL via INTRAVENOUS

## 2011-03-22 MED ORDER — PHYTONADIONE 5 MG PO TABS
5.0000 mg | ORAL_TABLET | Freq: Once | ORAL | Status: AC
  Administered 2011-03-22: 5 mg via ORAL
  Filled 2011-03-22: qty 1

## 2011-03-22 MED ORDER — ALBUTEROL SULFATE (5 MG/ML) 0.5% IN NEBU: 2.5000 mg | INHALATION_SOLUTION | RESPIRATORY_TRACT | Status: DC | PRN

## 2011-03-22 MED ORDER — PREDNISONE 5 MG PO TABS
5.0000 mg | ORAL_TABLET | Freq: Every day | ORAL | Status: DC
  Administered 2011-03-22 – 2011-03-28 (×7): 5 mg via ORAL
  Filled 2011-03-22 (×8): qty 1

## 2011-03-22 MED ORDER — SIMETHICONE 80 MG PO CHEW
80.0000 mg | CHEWABLE_TABLET | Freq: Four times a day (QID) | ORAL | Status: DC | PRN
  Administered 2011-03-22 (×2): 80 mg via ORAL
  Filled 2011-03-22: qty 1

## 2011-03-22 MED ORDER — POLYETHYLENE GLYCOL 3350 17 G PO PACK
17.0000 g | PACK | Freq: Every day | ORAL | Status: DC
  Administered 2011-03-23 – 2011-03-26 (×4): 17 g via ORAL
  Filled 2011-03-22 (×7): qty 1

## 2011-03-22 MED ORDER — ACETAMINOPHEN 650 MG RE SUPP: 650.0000 mg | Freq: Four times a day (QID) | RECTAL | Status: DC | PRN

## 2011-03-22 MED ORDER — ALPRAZOLAM 0.5 MG PO TABS
0.5000 mg | ORAL_TABLET | Freq: Two times a day (BID) | ORAL | Status: DC | PRN
  Administered 2011-03-24: 0.5 mg via ORAL
  Filled 2011-03-22: qty 1

## 2011-03-22 MED ORDER — VITAMIN K1 10 MG/ML IJ SOLN
5.0000 mg | Freq: Once | INTRAVENOUS | Status: AC
  Administered 2011-03-22: 5 mg via INTRAVENOUS
  Filled 2011-03-22: qty 0.5

## 2011-03-22 MED ORDER — ONDANSETRON HCL 4 MG PO TABS: 4.0000 mg | ORAL_TABLET | Freq: Four times a day (QID) | ORAL | Status: DC | PRN

## 2011-03-22 MED ORDER — GUAIFENESIN 200 MG PO TABS
400.0000 mg | ORAL_TABLET | Freq: Every day | ORAL | Status: DC
Start: 2011-03-22 — End: 2011-03-28
  Administered 2011-03-23 – 2011-03-28 (×6): 400 mg via ORAL
  Filled 2011-03-22 (×8): qty 2

## 2011-03-22 MED ORDER — NEOMYCIN-POLYMYXIN-HC 3.5-10000-1 OT SOLN
3.0000 [drp] | Freq: Three times a day (TID) | OTIC | Status: DC
  Administered 2011-03-22 – 2011-03-28 (×17): 3 [drp] via OTIC
  Filled 2011-03-22: qty 10

## 2011-03-22 MED ORDER — POLYETHYLENE GLYCOL 3350 17 GM/SCOOP PO POWD
17.0000 g | Freq: Every day | ORAL | Status: DC
  Filled 2011-03-22: qty 255

## 2011-03-22 MED ORDER — ONDANSETRON HCL 4 MG/2ML IJ SOLN
4.0000 mg | Freq: Four times a day (QID) | INTRAMUSCULAR | Status: DC | PRN
  Administered 2011-03-26: 4 mg via INTRAVENOUS
  Filled 2011-03-22: qty 2

## 2011-03-22 MED ORDER — SODIUM CHLORIDE 0.9 % IV BOLUS (SEPSIS)
1000.0000 mL | Freq: Once | INTRAVENOUS | Status: AC
  Administered 2011-03-22: 1000 mL via INTRAVENOUS

## 2011-03-22 MED ORDER — SODIUM CHLORIDE 0.9 % IV SOLN: INTRAVENOUS | Status: AC

## 2011-03-22 MED ORDER — POTASSIUM CHLORIDE IN NACL 20-0.9 MEQ/L-% IV SOLN
INTRAVENOUS | Status: AC
  Administered 2011-03-22 – 2011-03-23 (×2): via INTRAVENOUS
  Filled 2011-03-22 (×3): qty 1000

## 2011-03-22 MED ORDER — ALUM & MAG HYDROXIDE-SIMETH 200-200-20 MG/5ML PO SUSP: 30.0000 mL | Freq: Four times a day (QID) | ORAL | Status: DC | PRN

## 2011-03-22 MED ORDER — FUROSEMIDE 10 MG/ML IJ SOLN
40.0000 mg | Freq: Once | INTRAMUSCULAR | Status: AC
  Administered 2011-03-22: 40 mg via INTRAVENOUS
  Filled 2011-03-22: qty 4

## 2011-03-22 MED ORDER — PANTOPRAZOLE SODIUM 40 MG PO TBEC
40.0000 mg | DELAYED_RELEASE_TABLET | Freq: Two times a day (BID) | ORAL | Status: DC
  Administered 2011-03-23 – 2011-03-28 (×11): 40 mg via ORAL
  Filled 2011-03-22 (×15): qty 1

## 2011-03-22 MED ORDER — SODIUM CHLORIDE 0.9 % IV BOLUS (SEPSIS): 250.0000 mL | Freq: Once | INTRAVENOUS | Status: DC

## 2011-03-22 MED ORDER — ACETAMINOPHEN 325 MG PO TABS
650.0000 mg | ORAL_TABLET | Freq: Four times a day (QID) | ORAL | Status: DC | PRN
  Administered 2011-03-25 – 2011-03-26 (×2): 650 mg via ORAL
  Filled 2011-03-22 (×2): qty 2

## 2011-03-22 NOTE — ED Notes (Signed)
Report given to [facility] Rn

## 2011-03-22 NOTE — ED Notes (Signed)
Up bedside commode mod amt of rectal bleeding bright reported to EDPA

## 2011-03-22 NOTE — ED Notes (Signed)
Pt brought straight back to room 3. Assigned RN assisted in getting pt into bed, and changed in hospital gown. Pt hooked up to cardiac monitor and pulse ox, pt repositioned upward in bed for comfort and to encourage pt to take slowed deeper breaths. MD Juleen China made aware of pts condition.

## 2011-03-22 NOTE — ED Notes (Signed)
Pt eating, family at bedside. 

## 2011-03-22 NOTE — ED Notes (Signed)
Per [place], Consulting civil engineer whom has been at bedside since pt arrival from [place], pt is 1:1; therefore, Neena Rhymes, Float RN to care for pt for now, will monitor.

## 2011-03-22 NOTE — H&P (Addendum)
BRIHANY BUTCH MRN: [id number] DOB/AGE: [birth year] 78 y.o. Primary Care Physician:NADEL,SCOTT M, MD, MD Admit date: 03/22/2011  Chief Complaint: Rectal bleeding  HPI: 87yF with rectal bleeding. Crampy abdominal pain since around 2200 last night. Shortly before arrival began having BRBPR. On coumadin for hx of clot R arm. Pt has port R chest for hx of pancreatic CA. Per review of records had been getting chemo up until March of last year. Per daughter subsequent screening imaging has been negative. Pt followed by [facility] and at [facility] for this. Denies previous hx of GI bleed. No bleeding from anywhere else. Pt c/o abdominal pain and SOB. She  has a history of a superficial thrombus in the basilic vein for which  she is on anticoagulation. This morning, she received a Doppler of the  right upper extremity which showed no evidence of DVT or superficial  thrombosis. Mrs. Contino is doing has yet to followup with [name]  at [facility]. She receives periodic followup and CT scans there.  She reports she is eating well and has regular bowel movements, although  she is more fatigued than usual.      Past Medical History  Diagnosis Date  . Allergic rhinitis   . Bronchitis, mucopurulent recurrent   . Pulmonary fibrosis   . DJD (degenerative joint disease)   . Lumbar back pain   . Seronegative rheumatoid arthritis   . Osteopenia   . Arteriosclerotic dementia   . Paresthesia   . Multiple myeloma   . Anxiety   . Adenocarcinoma of pancreas   . PONV (postoperative nausea and vomiting)     Past Surgical History  Procedure Date  . Appendectomy   . Abdominal hysterectomy   . Common dile duct stent 02/2010    for pancreatic cancer    Prior to Admission medications   Medication Sig Start Date End Date Taking? Authorizing Provider  acetaminophen (TYLENOL) 325 MG tablet Take 325 mg by mouth every 6 (six) hours as needed.    Yes Historical Provider, MD  ALPRAZolam Prudy Feeler) 0.5 MG tablet  Take 0.5 mg by mouth as needed.    Yes Historical Provider, MD  aspirin 81 MG tablet Take 81 mg by mouth daily.     Yes Historical Provider, MD  B Complex Vitamins (VITAMIN B COMPLEX PO) daily    Yes Historical Provider, MD  Cholecalciferol (VITAMIN D) 1000 UNITS capsule Take 1,000 Units by mouth daily.     Yes Historical Provider, MD  dexlansoprazole (DEXILANT) 60 MG capsule Take 60 mg by mouth as needed.    Yes Historical Provider, MD  docusate sodium (COLACE) 100 MG capsule Take 100 mg by mouth at bedtime.     Yes Historical Provider, MD  donepezil (ARICEPT) 5 MG tablet TAKE 1 TABLET EVERY DAY 12/26/10  Yes Michele Mcalpine, MD  Ferrous Sulfate 83 MG TABS Take 1 tablet by mouth daily.     Yes Historical Provider, MD  fish oil-omega-3 fatty acids 1000 MG capsule Take 2 g by mouth daily.     Yes Historical Provider, MD  guaifenesin (MUCUS RELIEF) 400 MG TABS Take 400 mg by mouth daily.    Yes Historical Provider, MD  megestrol (MEGACE) 20 MG tablet TAKE 1 TABLET EVERY DAY 12/26/10  Yes Michele Mcalpine, MD  Multiple Vitamin (MULTIVITAMIN) capsule Take 1 capsule by mouth daily. velocity    Yes Historical Provider, MD  NEOMYCIN-POLYMYXIN-HC, OTIC, (CORTISPORIN) 1 % SOLN Place 3 drops into the right ear  3 (three) times daily. 07/23/10  Yes Michele Mcalpine, MD  polyethylene glycol powder (MIRALAX) powder Take 17 g by mouth daily.     Yes Historical Provider, MD  predniSONE (DELTASONE) 5 MG tablet Take 1 tablet (5 mg total) by mouth daily. 10/28/10  Yes Michele Mcalpine, MD  simethicone (MYLICON) 125 MG chewable tablet Chew 125 mg by mouth every 6 (six) hours as needed.     Yes Historical Provider, MD  vitamin E (VITAMIN E) 400 UNIT capsule Take 400 Units by mouth daily.     Yes Historical Provider, MD  warfarin (COUMADIN) 4 MG tablet Take 4 mg by mouth. Takes 4mg s on Friday and  2mg  all other   Yes Historical Provider, MD  Misc. Devices [facility]) MISC Rolling walker with seat Dx 715.90 & 714.0 06/16/10   Michele Mcalpine, MD    Allergies:  Allergies  Allergen Reactions  . Bee Pollen Swelling    History reviewed. No pertinent family history.  Social History:  reports that she quit smoking about 66 years ago. Her smoking use included Cigarettes. She quit after 1 year of use. She has never used smokeless tobacco. She reports that she does not drink alcohol or use illicit drugs.  Family history was reviewed and was found to be negative     ROS: A complete 14 point review of systems was done as documented in history of present illness PHYSICAL EXAM: Blood pressure 120/66, pulse 102, temperature 98.7 F (37.1 C), temperature source Oral, resp. rate 22, SpO2 100.00%. Nursing note and vitals reviewed.  Constitutional: She appears well-nourished. She appears distressed.  Pt laying in bed with eyes closed. Increased WOB.  HENT:  Head: Normocephalic and atraumatic.  Right Ear: External ear normal.  Left Ear: External ear normal.  Mouth/Throat: Oropharynx is clear and moist.  Eyes: Pupils are equal, round, and reactive to light. Right eye exhibits no discharge. Left eye exhibits no discharge.  Neck: Neck supple. No JVD present.  Cardiovascular: Regular rhythm and normal heart sounds.  tachycardic  Pulmonary/Chest: Breath sounds normal. No stridor. She is in respiratory distress.  tachypneic around 30/min. Lungs clear. Accessory muscle use.  Abdominal: Soft. She exhibits no mass. There is tenderness. There is no rebound and no guarding.  Mild diffuse tenderness  Musculoskeletal: She exhibits no edema and no tenderness.  Neurological: She is alert.  Skin: Skin is warm and dry.  Psychiatric: Thought content normal.    No results found for this or any previous visit (from the past 240 hour(s)).   Results for orders placed during the hospital encounter of 03/22/11 (from the past 48 hour(s))  PROTIME-INR     Status: Abnormal   Collection Time   03/22/11  4:37 AM      Component Value Range Comment     Prothrombin Time 36.6 (*) 11.6 - 15.2 (seconds)    INR 3.62 (*) 0.00 - 1.49    CBC     Status: Abnormal   Collection Time   03/22/11  4:37 AM      Component Value Range Comment   WBC 11.5 (*) 4.0 - 10.5 (K/uL)    RBC 3.56 (*) 3.87 - 5.11 (MIL/uL)    Hemoglobin 10.9 (*) 12.0 - 15.0 (g/dL)    HCT 16.1 (*) 09.6 - 46.0 (%)    MCV 89.9  78.0 - 100.0 (fL)    MCH 30.6  26.0 - 34.0 (pg)    MCHC 34.1  30.0 - 36.0 (g/dL)  RDW 16.8 (*) 11.5 - 15.5 (%)    Platelets 193  150 - 400 (K/uL)   COMPREHENSIVE METABOLIC PANEL     Status: Abnormal   Collection Time   03/22/11  4:37 AM      Component Value Range Comment   Sodium 132 (*) 135 - 145 (mEq/L)    Potassium 3.7  3.5 - 5.1 (mEq/L)    Chloride 98  96 - 112 (mEq/L)    CO2 17 (*) 19 - 32 (mEq/L)    Glucose, Bld 159 (*) 70 - 99 (mg/dL)    BUN 10  6 - 23 (mg/dL)    Creatinine, Ser 0.45  0.50 - 1.10 (mg/dL)    Calcium 9.3  8.4 - 10.5 (mg/dL)    Total Protein 7.1  6.0 - 8.3 (g/dL)    Albumin 3.3 (*) 3.5 - 5.2 (g/dL)    AST 409 (*) 0 - 37 (U/L)    ALT 181 (*) 0 - 35 (U/L)    Alkaline Phosphatase 320 (*) 39 - 117 (U/L)    Total Bilirubin 1.6 (*) 0.3 - 1.2 (mg/dL)    GFR calc non Af Amer 83 (*) >90 (mL/min)    GFR calc Af Amer >90  >90 (mL/min)   LIPASE, BLOOD     Status: Normal   Collection Time   03/22/11  4:37 AM      Component Value Range Comment   Lipase 25  11 - 59 (U/L)   TYPE AND SCREEN     Status: Normal   Collection Time   03/22/11  4:45 AM      Component Value Range Comment   ABO/RH(D) B POS      Antibody Screen NEG      Sample Expiration 03/25/2011     POCT I-STAT TROPONIN I     Status: Normal   Collection Time   03/22/11  4:52 AM      Component Value Range Comment   Troponin i, poc 0.00  0.00 - 0.08 (ng/mL)    Comment 3            ABO/RH     Status: Normal   Collection Time   03/22/11  6:05 AM      Component Value Range Comment   ABO/RH(D) B POS       Dg Chest 1 View  03/22/2011  *RADIOLOGY REPORT*  Clinical Data: Short of  breath.  Pulmonary fibrosis.  Pancreatic cancer.  CHEST - 1 VIEW  Comparison: 11/04/2010.  Findings: Patient is rotated to the right.  Right IJ Port-A- Cath/power port is unchanged.  Low lung volumes with basilar atelectasis.  No airspace disease.  No effusion.  Cardiopericardial silhouette is probably unchanged allowing for low inspiratory volumes.  IMPRESSION: Low volume chest.  Stable support apparatus.  No gross consolidation.  Original Report Authenticated By: Andreas Newport, M.D.   US Abdomen Complete  03/22/2011  *RADIOLOGY REPORT*  Clinical Data:  Abdominal pain, new transaminitis.  History of pancreatic cancer status post biliary stent in 2011  COMPLETE ABDOMINAL ULTRASOUND  Comparison:  None.  Findings:  Gallbladder:  Gallbladder sludge.  No gallbladder wall thickening, pericholecystic fluid, or sonographic Murphy's sign.  Common bile duct:  Measures 10 mm.  Liver:  No focal lesion identified.  Pneumobilia, likely secondary to indwelling biliary stent.  IVC:  Appears normal.  Pancreas:  Poorly visualized.  Pancreatic duct stent in place.  Spleen:  Measures 8.1 cm.  Right Kidney:  Measures 9.6 cm.  No mass or hydronephrosis.  Left Kidney:  Measures 9.6 cm.  No mass or hydronephrosis.  Abdominal aorta:  No aneurysm identified.  Additional comments:  Ascites.  IMPRESSION: Pancreatic duct stent in place.  Secondary pneumobilia.  Gallbladder sludge, without associated findings to suggest acute cholecystitis.  Ascites.  Original Report Authenticated By: Charline Bills, M.D.    Impression: IMPRESSION AND PLAN: Mrs. Filippone is an 78 year old woman with who presents with rectal bleeding #1 rectal bleeding either GI has been consulted, the patient does not have a prior history of rectal bleeding, Coumadin has been placed on hold,: She did receive vitamin K, will repeat her PT/INR in about 6 hours, she is getting Coumadin or the basilar clean DVT. I contacted Dr. Hoover Brunette  office to recheck and this is the  only indication. I discussed with her and her Coumadin can be stopped. We'll transfuse 2 units of fresh frozen plasma, and repeat PT/INR  #2 History of adenocarcinoma of the pancreas status post biliary stent placement. on 02/17/2000. Status post  single-agent gemcitabine from 03/24/2010 through 06/01/2010. She  is currently undergoing surveillance, receiving periodic scans at  [facility]. She will be following up at [facility] later this month with scans.    #3 History of a right basilic vein thrombosis for which she is  anticoagulated with Coumadin. A Doppler of upper extremity was  negative. The CT scans continue to demonstrate no progression of  disease. We will discontinue anticoagulation.   #4 Anemia of chronic disease. The patient has received Aranesp in the  past. She is not a current candidate as her hemoglobin is greater  than 10. We will continue to monitor. She also has a history of multiple myeloma, I need to confirm this with oncology  #5 shortness of breath admit to telemetry, cycle cardiac enzymes him a 2-D echo, proBNP The CT scan of the chest to rule out pulmonary embolism

## 2011-03-22 NOTE — ED Notes (Signed)
Pt cleaned and peri care completed. Large amount of blood and clots from rectum.

## 2011-03-22 NOTE — Consult Note (Addendum)
Reason for Consult:Hematochezia Referring Physician: [facility]  Heather Dorsey HPI: This is an 78 year female with a PMH of pancreatic cancer s/p chemotherapy with an excellent response.  Acutely last evening she started to have painless hematochezia.  This concerned her daughter and she brought her over to the ER for further evaluation.  While in the ER she had another bloody bowel movement and it was more blood than stool.  There is no report of any abdominal pain.  She is on coumadin for a DVT that was associated with her central line.  She was supposed to have a repeat scan of her upper extremities to see if the clot resolved over the interval time period at [facility].  Past Medical History  Diagnosis Date  . Allergic rhinitis   . Bronchitis, mucopurulent recurrent   . Pulmonary fibrosis   . DJD (degenerative joint disease)   . Lumbar back pain   . Seronegative rheumatoid arthritis   . Osteopenia   . Arteriosclerotic dementia   . Paresthesia   . Multiple myeloma   . Anxiety   . Adenocarcinoma of pancreas   . PONV (postoperative nausea and vomiting)     Past Surgical History  Procedure Date  . Appendectomy   . Abdominal hysterectomy   . Common dile duct stent 02/2010    for pancreatic cancer    History reviewed. No pertinent family history.  Social History:  reports that she quit smoking about 66 years ago. Her smoking use included Cigarettes. She quit after 1 year of use. She has never used smokeless tobacco. She reports that she does not drink alcohol or use illicit drugs.  Allergies:  Allergies  Allergen Reactions  . Bee Pollen Swelling    Medications:  Scheduled:   . ondansetron (ZOFRAN) IV  4 mg Intravenous Once  . phytonadione  5 mg Oral Once  . predniSONE  5 mg Oral Daily  . sodium chloride  1,000 mL Intravenous Once   Continuous:   Results for orders placed during the hospital encounter of 03/22/11 (from the past 24 hour(s))  PROTIME-INR      Status: Abnormal   Collection Time   03/22/11  4:37 AM      Component Value Range   Prothrombin Time 36.6 (*) 11.6 - 15.2 (seconds)   INR 3.62 (*) 0.00 - 1.49   CBC     Status: Abnormal   Collection Time   03/22/11  4:37 AM      Component Value Range   WBC 11.5 (*) 4.0 - 10.5 (K/uL)   RBC 3.56 (*) 3.87 - 5.11 (MIL/uL)   Hemoglobin 10.9 (*) 12.0 - 15.0 (g/dL)   HCT 40.9 (*) 81.1 - 46.0 (%)   MCV 89.9  78.0 - 100.0 (fL)   MCH 30.6  26.0 - 34.0 (pg)   MCHC 34.1  30.0 - 36.0 (g/dL)   RDW 91.4 (*) 78.2 - 15.5 (%)   Platelets 193  150 - 400 (K/uL)  COMPREHENSIVE METABOLIC PANEL     Status: Abnormal   Collection Time   03/22/11  4:37 AM      Component Value Range   Sodium 132 (*) 135 - 145 (mEq/L)   Potassium 3.7  3.5 - 5.1 (mEq/L)   Chloride 98  96 - 112 (mEq/L)   CO2 17 (*) 19 - 32 (mEq/L)   Glucose, Bld 159 (*) 70 - 99 (mg/dL)   BUN 10  6 - 23 (mg/dL)   Creatinine, Ser  0.55  0.50 - 1.10 (mg/dL)   Calcium 9.3  8.4 - 29.5 (mg/dL)   Total Protein 7.1  6.0 - 8.3 (g/dL)   Albumin 3.3 (*) 3.5 - 5.2 (g/dL)   AST 621 (*) 0 - 37 (U/L)   ALT 181 (*) 0 - 35 (U/L)   Alkaline Phosphatase 320 (*) 39 - 117 (U/L)   Total Bilirubin 1.6 (*) 0.3 - 1.2 (mg/dL)   GFR calc non Af Amer 83 (*) >90 (mL/min)   GFR calc Af Amer >90  >90 (mL/min)  LIPASE, BLOOD     Status: Normal   Collection Time   03/22/11  4:37 AM      Component Value Range   Lipase 25  11 - 59 (U/L)  TYPE AND SCREEN     Status: Normal   Collection Time   03/22/11  4:45 AM      Component Value Range   ABO/RH(D) B POS     Antibody Screen NEG     Sample Expiration 03/25/2011    POCT I-STAT TROPONIN I     Status: Normal   Collection Time   03/22/11  4:52 AM      Component Value Range   Troponin i, poc 0.00  0.00 - 0.08 (ng/mL)   Comment 3           ABO/RH     Status: Normal   Collection Time   03/22/11  6:05 AM      Component Value Range   ABO/RH(D) B POS    PRO B NATRIURETIC PEPTIDE     Status: Normal   Collection Time   03/22/11  12:24 PM      Component Value Range   Pro B Natriuretic peptide (BNP) 327.1  0 - 450 (pg/mL)  CBC     Status: Abnormal   Collection Time   03/22/11  1:23 PM      Component Value Range   WBC 10.8 (*) 4.0 - 10.5 (K/uL)   RBC 2.88 (*) 3.87 - 5.11 (MIL/uL)   Hemoglobin 8.9 (*) 12.0 - 15.0 (g/dL)   HCT 30.8 (*) 65.7 - 46.0 (%)   MCV 87.8  78.0 - 100.0 (fL)   MCH 30.9  26.0 - 34.0 (pg)   MCHC 35.2  30.0 - 36.0 (g/dL)   RDW 84.6 (*) 96.2 - 15.5 (%)   Platelets 122 (*) 150 - 400 (K/uL)     Dg Chest 1 View  03/22/2011  *RADIOLOGY REPORT*  Clinical Data: Short of breath.  Pulmonary fibrosis.  Pancreatic cancer.  CHEST - 1 VIEW  Comparison: 11/04/2010.  Findings: Patient is rotated to the right.  Right IJ Port-A- Cath/power port is unchanged.  Low lung volumes with basilar atelectasis.  No airspace disease.  No effusion.  Cardiopericardial silhouette is probably unchanged allowing for low inspiratory volumes.  IMPRESSION: Low volume chest.  Stable support apparatus.  No gross consolidation.  Original Report Authenticated By: Andreas Newport, M.D.   US Abdomen Complete  03/22/2011  *RADIOLOGY REPORT*  Clinical Data:  Abdominal pain, new transaminitis.  History of pancreatic cancer status post biliary stent in 2011  COMPLETE ABDOMINAL ULTRASOUND  Comparison:  None.  Findings:  Gallbladder:  Gallbladder sludge.  No gallbladder wall thickening, pericholecystic fluid, or sonographic Murphy's sign.  Common bile duct:  Measures 10 mm.  Liver:  No focal lesion identified.  Pneumobilia, likely secondary to indwelling biliary stent.  IVC:  Appears normal.  Pancreas:  Poorly visualized.  Pancreatic duct stent  in place.  Spleen:  Measures 8.1 cm.  Right Kidney:  Measures 9.6 cm.  No mass or hydronephrosis.  Left Kidney:  Measures 9.6 cm.  No mass or hydronephrosis.  Abdominal aorta:  No aneurysm identified.  Additional comments:  Ascites.  IMPRESSION: Pancreatic duct stent in place.  Secondary pneumobilia.  Gallbladder  sludge, without associated findings to suggest acute cholecystitis.  Ascites.  Original Report Authenticated By: Charline Bills, M.D.    ROS:  As stated above in the HPI otherwise negative.  Blood pressure 100/67, pulse 101, temperature 98.6 F (37 C), temperature source Oral, resp. rate 17, SpO2 100.00%.    PE: Gen: NAD, Alert and Oriented, SOB - difficulty with completing sentences HEENT:  [place]/AT, EOMI Neck: Supple, no LAD Lungs: CTA Bilaterally CV: RRR without M/G/R ABM: Soft, NTND, +BS Ext: No C/C/E  Assessment/Plan: 1) Hematochezia. 2) History of DVT on coumadin. 3) History of pancreatic cancer.   The patient denies having a prior colonoscopy in the past.  I think she will benefit with a colonoscopy since her pancreatic cancer appears to be in remission, however, she is significantly SOB.  This is new from when I last saw her a few months ago in the office.  She is hemodynamically stable and her HGB has not dropped significantly.  Plan: 1) Work up SOB. 2) Hold on a colonoscopy until her SOB issues have resolved. 3) Reverse coumadin. 4) Transfuse if necessary. 5) I will follow along.  Christan Defranco D 03/22/2011, 2:03 PM    Nursing notified me that the patient has more bleeding.  She is going to receive FFP and she will be transfused as necessary.  Unfortunately her respiratory status precludes any endoscopic evaluation at this time.  I doubt that the patient has a rapid upper GI bleed, but I will follow closely.

## 2011-03-22 NOTE — ED Notes (Signed)
IV team has called about PICC.  Pt currently has peripheral IV and her PAC accessed.  IV team aware of this and that says to call back if it is needed.

## 2011-03-22 NOTE — ED Provider Notes (Addendum)
History    87yF with rectal bleeding. Crampy abdominal pain since around 2200 last night. Shortly before arrival began having BRBPR. On coumadin for hx of clot R arm. Pt has port R chest for hx of pancreatic CA. Per review of records had been getting chemo up until March of this year. Per daughter subsequent screening imaging has been negative. Pt followed by odogwu and at [facility] for this. Denies previous hx of GI bleed. No bleeding from anywhere else. Pt c/o abdominal pain and SOB. Denies chest or back pain.  CSN: [id number]  Arrival date & time 03/22/11  0406   First MD Initiated Contact with Patient 03/22/11 [phone]      Chief Complaint  Patient presents with  . Rectal Bleeding  . Nausea  . Emesis  . Weakness    (Consider location/radiation/quality/duration/timing/severity/associated sxs/prior treatment) HPI  Past Medical History  Diagnosis Date  . Allergic rhinitis   . Bronchitis, mucopurulent recurrent   . Pulmonary fibrosis   . DJD (degenerative joint disease)   . Lumbar back pain   . Seronegative rheumatoid arthritis   . Osteopenia   . Arteriosclerotic dementia   . Paresthesia   . Multiple myeloma   . Anxiety   . Adenocarcinoma of pancreas     Past Surgical History  Procedure Date  . Appendectomy   . Abdominal hysterectomy   . Common dile duct stent 02/2010    for pancreatic cancer    No family history on file.  History  Substance Use Topics  . Smoking status: Former Smoker -- 1 years    Types: Cigarettes    Quit date: 03/14/1945  . Smokeless tobacco: Never Used   Comment: smoked as a teenager  . Alcohol Use: No    OB History    Grav Para Term Preterm Abortions TAB SAB Ect Mult Living                  Review of Systems   Review of symptoms negative unless otherwise noted in HPI.   Allergies  Bee pollen  Home Medications   Current Outpatient Rx  Name Route Sig Dispense Refill  . ACETAMINOPHEN 325 MG PO TABS Oral Take 325 mg by mouth every 6  (six) hours as needed.     . ALPRAZOLAM 0.5 MG PO TABS Oral Take 0.5 mg by mouth as needed.     . ASPIRIN 81 MG PO TABS Oral Take 81 mg by mouth daily.      Marland Kitchen VITAMIN B COMPLEX PO  daily     . VITAMIN D 1000 UNITS PO CAPS Oral Take 1,000 Units by mouth daily.      . DEXLANSOPRAZOLE 60 MG PO CPDR Oral Take 60 mg by mouth as needed.     Marland Kitchen DOCUSATE SODIUM 100 MG PO CAPS Oral Take 100 mg by mouth at bedtime.      . DONEPEZIL HCL 5 MG PO TABS  TAKE 1 TABLET EVERY DAY 90 tablet 1  . FERROUS SULFATE 83 MG PO TABS Oral Take 1 tablet by mouth daily.      . OMEGA-3 FATTY ACIDS 1000 MG PO CAPS Oral Take 2 g by mouth daily.      . GUAIFENESIN 400 MG PO TABS Oral Take 400 mg by mouth daily.     . MEGESTROL ACETATE 20 MG PO TABS  TAKE 1 TABLET EVERY DAY 90 tablet 1  . WALKER MISC  Rolling walker with seat Dx 715.90 & 714.0 1 each  0  . MULTIVITAMINS PO CAPS Oral Take 1 capsule by mouth daily. velocity     . NEOMYCIN-POLYMYXIN-HC 1 % OT SOLN Right Ear Place 3 drops into the right ear 3 (three) times daily. 1 Bottle 0  . POLYETHYLENE GLYCOL 3350 PO POWD Oral Take 17 g by mouth daily.      Marland Kitchen PREDNISONE 10 MG PO TABS Oral Take 1 tablet (10 mg total) by mouth daily. 7 tablet 0  . PREDNISONE 5 MG PO TABS Oral Take 1 tablet (5 mg total) by mouth daily. 30 tablet 11  . PREDNISONE 5 MG PO TABS  TAKE 1 TABLETS EVERY DAY 60 tablet 2  . SIMETHICONE 125 MG PO CHEW Oral Chew 125 mg by mouth every 6 (six) hours as needed.      Marland Kitchen VITAMIN E 400 UNITS PO CAPS Oral Take 400 Units by mouth daily.      . WARFARIN SODIUM 4 MG PO TABS Oral Take 4 mg by mouth. As directed       BP 122/104  Pulse 121  Temp(Src) 97.6 F (36.4 C) (Oral)  Resp 29  SpO2 93%  Physical Exam  Nursing note and vitals reviewed. Constitutional: She appears well-nourished. She appears distressed.       Pt laying in bed with eyes closed. Increased WOB.  HENT:  Head: Normocephalic and atraumatic.  Right Ear: External ear normal.  Left Ear:  External ear normal.  Mouth/Throat: Oropharynx is clear and moist.  Eyes: Pupils are equal, round, and reactive to light. Right eye exhibits no discharge. Left eye exhibits no discharge.  Neck: Neck supple. No JVD present.  Cardiovascular: Regular rhythm and normal heart sounds.        tachycardic  Pulmonary/Chest: Breath sounds normal. No stridor. She is in respiratory distress.       tachypneic around 30/min. Lungs clear. Accessory muscle use.  Abdominal: Soft. She exhibits no mass. There is tenderness. There is no rebound and no guarding.       Mild diffuse tenderness  Musculoskeletal: She exhibits no edema and no tenderness.  Neurological: She is alert.  Skin: Skin is warm and dry.  Psychiatric: Thought content normal.    ED Course  Procedures (including critical care time)  Labs Reviewed  PROTIME-INR - Abnormal; Notable for the following:    Prothrombin Time 36.6 (*)    INR 3.62 (*)    All other components within normal limits  CBC - Abnormal; Notable for the following:    WBC 11.5 (*)    RBC 3.56 (*)    Hemoglobin 10.9 (*)    HCT 32.0 (*)    RDW 16.8 (*)    All other components within normal limits  COMPREHENSIVE METABOLIC PANEL - Abnormal; Notable for the following:    Sodium 132 (*)    CO2 17 (*)    Glucose, Bld 159 (*)    Albumin 3.3 (*)    AST 408 (*)    ALT 181 (*)    Alkaline Phosphatase 320 (*)    Total Bilirubin 1.6 (*)    GFR calc non Af Amer 83 (*)    All other components within normal limits  TYPE AND SCREEN  POCT I-STAT TROPONIN I  ABO/RH  I-STAT TROPONIN I  LIPASE, BLOOD   No results found.  5:01 AM Pt reassessed. HR 100-110. Normotensive.  No new complaints. Just got vitK. IVF infusing.  5:31 AM Pt reassessed. HR 90-100. Remains normotensive. Says feels a little better.  Mild anemia but same as a month ago and appears near baseline. Will discuss with GI. Admission.  6:54 AM Discussed with Dr Matthias Hughs, GI. Will see pt in consult.   EKG:    Rhythm: likely normal sinus. Regular, narrow complex rhythm. Poor baseline makes difficult to assess for definite p waves. Rate: 99 Axis: Left Intervals: ST segments: difficult to assess because of poor baseline but no STE and appearing to be t-wave flattening inferiorly and laterally. This was noted on previous   1. Rectal bleed   2. Supratherapeutic INR   3. Transaminitis   4. Nausea       MDM  87yF with BRBPR. Tachy on arrival but improved with IVF. HD stable. Hemoglobin appears to be near baseline. Coagulopathy 2/2 coumadin for hx of blood clot associated with R chest port. Vit K given. Do not feel FFP needed at this time. Transaminitis and mild increase in t bili. Given mild abdominal tenderness and hx of biliary stenting, [place] and lipase added. Dicussed with GI who will see in consult. Will need admit for further eval.       Raeford Razor, MD 03/22/11 1610  Raeford Razor, MD 03/30/11 [phone]

## 2011-03-22 NOTE — ED Notes (Signed)
Attempt to call report to 2W unsuccessful. Receiving RN will return call to ED when available for report.

## 2011-03-22 NOTE — Telephone Encounter (Signed)
Will forward to SN to make him aware.   

## 2011-03-22 NOTE — ED Notes (Signed)
Pt toileted, large amount of clots and blood in stool.

## 2011-03-22 NOTE — ED Notes (Signed)
Dr. Susie Cassette contacted re pt having active GI bleed.  Pt transferred to [facility] at 1604 from [id number].   Pt is A&Ox 4-pt's daughter at bedside.  Pt is incontinent with bright red blood stool with clots.  Dr. Susie Cassette made aware.  FFP #1 finished infusing at 1640.

## 2011-03-22 NOTE — Progress Notes (Addendum)
Heather Dorsey MRN: [id number] DOB/AGE: 76-Nov-1925 76 y.o. Primary Care Physician:NADEL,SCOTT M, MD, MD Admit date: 03/22/2011  Chief Complaint: Rectal bleeding  HPI: 78yF with rectal bleeding. Crampy abdominal pain since around 2200 last night. Shortly before arrival began having BRBPR. On coumadin for hx of clot R arm. Pt has port R chest for hx of pancreatic CA. Per review of records had been getting chemo up until March of last year. Per daughter subsequent screening imaging has been negative. Pt followed by odogwu and at [facility] for this. Denies previous hx of GI bleed. No bleeding from anywhere else. Pt c/o abdominal pain and SOB. She  has a history of a superficial thrombus in the basilic vein for which  she is on anticoagulation. This morning, she received a Doppler of the  right upper extremity which showed no evidence of DVT or superficial  thrombosis. [name] is doing has yet to followup with [name]  at [facility]. She receives periodic followup and CT scans there.  She reports she is eating well and has regular bowel movements, although  she is more fatigued than usual.      Past Medical History  Diagnosis Date  . Allergic rhinitis   . Bronchitis, mucopurulent recurrent   . Pulmonary fibrosis   . DJD (degenerative joint disease)   . Lumbar back pain   . Seronegative rheumatoid arthritis   . Osteopenia   . Arteriosclerotic dementia   . Paresthesia   . Multiple myeloma   . Anxiety   . Adenocarcinoma of pancreas   . PONV (postoperative nausea and vomiting)     Past Surgical History  Procedure Date  . Appendectomy   . Abdominal hysterectomy   . Common dile duct stent 02/2010    for pancreatic cancer    Prior to Admission medications   Medication Sig Start Date End Date Taking? Authorizing Provider  acetaminophen (TYLENOL) 325 MG tablet Take 325 mg by mouth every 6 (six) hours as needed.    Yes Historical Provider, MD  ALPRAZolam Prudy Feeler) 0.5 MG tablet  Take 0.5 mg by mouth as needed.    Yes Historical Provider, MD  aspirin 81 MG tablet Take 81 mg by mouth daily.     Yes Historical Provider, MD  B Complex Vitamins (VITAMIN B COMPLEX PO) daily    Yes Historical Provider, MD  Cholecalciferol (VITAMIN D) 1000 UNITS capsule Take 1,000 Units by mouth daily.     Yes Historical Provider, MD  dexlansoprazole (DEXILANT) 60 MG capsule Take 60 mg by mouth as needed.    Yes Historical Provider, MD  docusate sodium (COLACE) 100 MG capsule Take 100 mg by mouth at bedtime.     Yes Historical Provider, MD  donepezil (ARICEPT) 5 MG tablet TAKE 1 TABLET EVERY DAY 12/26/10  Yes Michele Mcalpine, MD  Ferrous Sulfate 83 MG TABS Take 1 tablet by mouth daily.     Yes Historical Provider, MD  fish oil-omega-3 fatty acids 1000 MG capsule Take 2 g by mouth daily.     Yes Historical Provider, MD  guaifenesin (MUCUS RELIEF) 400 MG TABS Take 400 mg by mouth daily.    Yes Historical Provider, MD  megestrol (MEGACE) 20 MG tablet TAKE 1 TABLET EVERY DAY 12/26/10  Yes Michele Mcalpine, MD  Multiple Vitamin (MULTIVITAMIN) capsule Take 1 capsule by mouth daily. velocity    Yes Historical Provider, MD  NEOMYCIN-POLYMYXIN-HC, OTIC, (CORTISPORIN) 1 % SOLN Place 3 drops into the right ear  3 (three) times daily. 07/23/10  Yes Michele Mcalpine, MD  polyethylene glycol powder (MIRALAX) powder Take 17 g by mouth daily.     Yes Historical Provider, MD  predniSONE (DELTASONE) 5 MG tablet Take 1 tablet (5 mg total) by mouth daily. 10/28/10  Yes Michele Mcalpine, MD  simethicone (MYLICON) 125 MG chewable tablet Chew 125 mg by mouth every 6 (six) hours as needed.     Yes Historical Provider, MD  vitamin E (VITAMIN E) 400 UNIT capsule Take 400 Units by mouth daily.     Yes Historical Provider, MD  warfarin (COUMADIN) 4 MG tablet Take 4 mg by mouth. Takes 4mg s on Friday and  2mg  all other   Yes Historical Provider, MD  Misc. Devices [facility]) MISC Rolling walker with seat Dx 715.90 & 714.0 06/16/10   Michele Mcalpine, MD    Allergies:  Allergies  Allergen Reactions  . Bee Pollen Swelling    History reviewed. No pertinent family history.  Social History:  reports that she quit smoking about 66 years ago. Her smoking use included Cigarettes. She quit after 1 year of use. She has never used smokeless tobacco. She reports that she does not drink alcohol or use illicit drugs.  Family history was reviewed and was found to be negative     ROS: A complete 14 point review of systems was done as documented in history of present illness PHYSICAL EXAM: Blood pressure 120/66, pulse 102, temperature 98.7 F (37.1 C), temperature source Oral, resp. rate 22, SpO2 100.00%. Nursing note and vitals reviewed.  Constitutional: She appears well-nourished. She appears distressed.  Pt laying in bed with eyes closed. Increased WOB.  HENT:  Head: Normocephalic and atraumatic.  Right Ear: External ear normal.  Left Ear: External ear normal.  Mouth/Throat: Oropharynx is clear and moist.  Eyes: Pupils are equal, round, and reactive to light. Right eye exhibits no discharge. Left eye exhibits no discharge.  Neck: Neck supple. No JVD present.  Cardiovascular: Regular rhythm and normal heart sounds.  tachycardic  Pulmonary/Chest: Breath sounds normal. No stridor. She is in respiratory distress.  tachypneic around 30/min. Lungs clear. Accessory muscle use.  Abdominal: Soft. She exhibits no mass. There is tenderness. There is no rebound and no guarding.  Mild diffuse tenderness  Musculoskeletal: She exhibits no edema and no tenderness.  Neurological: She is alert.  Skin: Skin is warm and dry.  Psychiatric: Thought content normal.    No results found for this or any previous visit (from the past 240 hour(s)).   Results for orders placed during the hospital encounter of 03/22/11 (from the past 48 hour(s))  PROTIME-INR     Status: Abnormal   Collection Time   03/22/11  4:37 AM      Component Value Range Comment     Prothrombin Time 36.6 (*) 11.6 - 15.2 (seconds)    INR 3.62 (*) 0.00 - 1.49    CBC     Status: Abnormal   Collection Time   03/22/11  4:37 AM      Component Value Range Comment   WBC 11.5 (*) 4.0 - 10.5 (K/uL)    RBC 3.56 (*) 3.87 - 5.11 (MIL/uL)    Hemoglobin 10.9 (*) 12.0 - 15.0 (g/dL)    HCT 16.1 (*) 09.6 - 46.0 (%)    MCV 89.9  78.0 - 100.0 (fL)    MCH 30.6  26.0 - 34.0 (pg)    MCHC 34.1  30.0 - 36.0 (g/dL)  RDW 16.8 (*) 11.5 - 15.5 (%)    Platelets 193  150 - 400 (K/uL)   COMPREHENSIVE METABOLIC PANEL     Status: Abnormal   Collection Time   03/22/11  4:37 AM      Component Value Range Comment   Sodium 132 (*) 135 - 145 (mEq/L)    Potassium 3.7  3.5 - 5.1 (mEq/L)    Chloride 98  96 - 112 (mEq/L)    CO2 17 (*) 19 - 32 (mEq/L)    Glucose, Bld 159 (*) 70 - 99 (mg/dL)    BUN 10  6 - 23 (mg/dL)    Creatinine, Ser 1.61  0.50 - 1.10 (mg/dL)    Calcium 9.3  8.4 - 10.5 (mg/dL)    Total Protein 7.1  6.0 - 8.3 (g/dL)    Albumin 3.3 (*) 3.5 - 5.2 (g/dL)    AST 096 (*) 0 - 37 (U/L)    ALT 181 (*) 0 - 35 (U/L)    Alkaline Phosphatase 320 (*) 39 - 117 (U/L)    Total Bilirubin 1.6 (*) 0.3 - 1.2 (mg/dL)    GFR calc non Af Amer 83 (*) >90 (mL/min)    GFR calc Af Amer >90  >90 (mL/min)   LIPASE, BLOOD     Status: Normal   Collection Time   03/22/11  4:37 AM      Component Value Range Comment   Lipase 25  11 - 59 (U/L)   TYPE AND SCREEN     Status: Normal   Collection Time   03/22/11  4:45 AM      Component Value Range Comment   ABO/RH(D) B POS      Antibody Screen NEG      Sample Expiration 03/25/2011     POCT I-STAT TROPONIN I     Status: Normal   Collection Time   03/22/11  4:52 AM      Component Value Range Comment   Troponin i, poc 0.00  0.00 - 0.08 (ng/mL)    Comment 3            ABO/RH     Status: Normal   Collection Time   03/22/11  6:05 AM      Component Value Range Comment   ABO/RH(D) B POS       Dg Chest 1 View  03/22/2011  *RADIOLOGY REPORT*  Clinical Data: Short of  breath.  Pulmonary fibrosis.  Pancreatic cancer.  CHEST - 1 VIEW  Comparison: 11/04/2010.  Findings: Patient is rotated to the right.  Right IJ Port-A- Cath/power port is unchanged.  Low lung volumes with basilar atelectasis.  No airspace disease.  No effusion.  Cardiopericardial silhouette is probably unchanged allowing for low inspiratory volumes.  IMPRESSION: Low volume chest.  Stable support apparatus.  No gross consolidation.  Original Report Authenticated By: Andreas Newport, M.D.   US Abdomen Complete  03/22/2011  *RADIOLOGY REPORT*  Clinical Data:  Abdominal pain, new transaminitis.  History of pancreatic cancer status post biliary stent in 2011  COMPLETE ABDOMINAL ULTRASOUND  Comparison:  None.  Findings:  Gallbladder:  Gallbladder sludge.  No gallbladder wall thickening, pericholecystic fluid, or sonographic Murphy's sign.  Common bile duct:  Measures 10 mm.  Liver:  No focal lesion identified.  Pneumobilia, likely secondary to indwelling biliary stent.  IVC:  Appears normal.  Pancreas:  Poorly visualized.  Pancreatic duct stent in place.  Spleen:  Measures 8.1 cm.  Right Kidney:  Measures 9.6 cm.  No mass or hydronephrosis.  Left Kidney:  Measures 9.6 cm.  No mass or hydronephrosis.  Abdominal aorta:  No aneurysm identified.  Additional comments:  Ascites.  IMPRESSION: Pancreatic duct stent in place.  Secondary pneumobilia.  Gallbladder sludge, without associated findings to suggest acute cholecystitis.  Ascites.  Original Report Authenticated By: Charline Bills, M.D.    Impression: IMPRESSION AND PLAN: Mrs. Fluhr is an 78 year old woman with who presents with rectal bleeding #1 rectal bleeding either GI has been consulted, the patient does not have a prior history of rectal bleeding, Coumadin has been placed on hold,: She did receive vitamin K, will repeat her PT/INR in about 6 hours, she is getting Coumadin or the basilar clean DVT. I contacted Dr. Hoover Brunette  office to recheck and this is the  only indication.  #2 History of adenocarcinoma of the pancreas status post biliary stent placement. on 02/17/2000. Status post  single-agent gemcitabine from 03/24/2010 through 06/01/2010. She  is currently undergoing surveillance, receiving periodic scans at  [facility]. She will be following up at [facility] later this month with scans.    #3 History of a right basilic vein thrombosis for which she is  anticoagulated with Coumadin. A Doppler of upper extremity was  negative. The CT scans continue to demonstrate no progression of  disease. We will discontinue anticoagulation.   #4 Anemia of chronic disease. The patient has received Aranesp in the  past. She is not a current candidate as her hemoglobin is greater  than 10. We will continue to monitor. She also has a history of multiple myeloma, I need to confirm this with oncology  #5 shortness of breath admit to telemetry, cycle cardiac enzymes him a 2-D echo, proBNP    Principal Problem:  *GI bleeding Active Problems:  MULTIPLE  MYELOMA  BRONCHITIS, RECURRENT  ADENOCARCINOMA, PANCREAS  DVT of upper extremity (deep vein thrombosis)            Bernyce Brimley 03/22/2011, 10:49 AM

## 2011-03-23 ENCOUNTER — Other Ambulatory Visit: Payer: Self-pay | Admitting: Hematology and Oncology

## 2011-03-23 DIAGNOSIS — K922 Gastrointestinal hemorrhage, unspecified: Secondary | ICD-10-CM

## 2011-03-23 DIAGNOSIS — I059 Rheumatic mitral valve disease, unspecified: Secondary | ICD-10-CM

## 2011-03-23 DIAGNOSIS — R109 Unspecified abdominal pain: Secondary | ICD-10-CM

## 2011-03-23 DIAGNOSIS — C250 Malignant neoplasm of head of pancreas: Secondary | ICD-10-CM

## 2011-03-23 LAB — CBC
HCT: 27.4 % — ABNORMAL LOW (ref 36.0–46.0)
HCT: 25.4 % — ABNORMAL LOW (ref 36.0–46.0)
HCT: 26.8 % — ABNORMAL LOW (ref 36.0–46.0)
Hemoglobin: 9.7 g/dL — ABNORMAL LOW (ref 12.0–15.0)
Hemoglobin: 9.2 g/dL — ABNORMAL LOW (ref 12.0–15.0)
MCH: 30.4 pg (ref 26.0–34.0)
MCHC: 35.4 g/dL (ref 30.0–36.0)
MCV: 85.9 fL (ref 78.0–100.0)
MCV: 85.6 fL (ref 78.0–100.0)
RBC: 2.98 MIL/uL — ABNORMAL LOW (ref 3.87–5.11)
RBC: 3.13 MIL/uL — ABNORMAL LOW (ref 3.87–5.11)
WBC: 8.8 10*3/uL (ref 4.0–10.5)
WBC: 6.2 10*3/uL (ref 4.0–10.5)

## 2011-03-23 LAB — COMPREHENSIVE METABOLIC PANEL
ALT: 224 U/L — ABNORMAL HIGH (ref 0–35)
AST: 280 U/L — ABNORMAL HIGH (ref 0–37)
Albumin: 2.6 g/dL — ABNORMAL LOW (ref 3.5–5.2)
CO2: 23 mEq/L (ref 19–32)
Chloride: 105 mEq/L (ref 96–112)
GFR calc non Af Amer: 79 mL/min — ABNORMAL LOW (ref >90)
Potassium: 3.4 mEq/L — ABNORMAL LOW (ref 3.5–5.1)
Sodium: 138 mEq/L (ref 135–145)
Total Bilirubin: 4.4 mg/dL — ABNORMAL HIGH (ref 0.3–1.2)

## 2011-03-23 LAB — CARDIAC PANEL(CRET KIN+CKTOT+MB+TROPI)
CK, MB: 2.2 ng/mL (ref 0.3–4.0)
Relative Index: 2 (ref 0.0–2.5)
Relative Index: 2.1 (ref 0.0–2.5)
Relative Index: INVALID (ref 0.0–2.5)
Total CK: 122 U/L (ref 7–177)
Total CK: 103 U/L (ref 7–177)
Troponin I: <0.3 ng/mL (ref <0.30)

## 2011-03-23 LAB — PREPARE FRESH FROZEN PLASMA

## 2011-03-23 LAB — PROTIME-INR
INR: 2.09 — ABNORMAL HIGH (ref 0.00–1.49)
Prothrombin Time: 23.8 seconds — ABNORMAL HIGH (ref 11.6–15.2)

## 2011-03-23 MED ORDER — POTASSIUM CHLORIDE CRYS ER 20 MEQ PO TBCR
40.0000 meq | EXTENDED_RELEASE_TABLET | Freq: Two times a day (BID) | ORAL | Status: AC
  Administered 2011-03-23 (×2): 40 meq via ORAL
  Filled 2011-03-23 (×2): qty 2

## 2011-03-23 MED ORDER — SODIUM CHLORIDE 0.9 % IV SOLN
Freq: Once | INTRAVENOUS | Status: AC
  Administered 2011-03-23: 12:00:00 via INTRAVENOUS

## 2011-03-23 MED ORDER — PEG 3350-KCL-NA BICARB-NACL 420 G PO SOLR
4000.0000 mL | Freq: Once | ORAL | Status: AC
  Administered 2011-03-23: 4000 mL via ORAL
  Filled 2011-03-23: qty 4000

## 2011-03-23 NOTE — Progress Notes (Addendum)
Subjective: Pt is comfortable, had two blood tinged bowel movements. Had some questions about colonoscopy which were answered.  Denies any abd pain, nausea or vomiting, on clear liquid diet.   Objective: Weight change:   Intake/Output Summary (Last 24 hours) at 03/23/11 1158 Last data filed at 03/23/11 0600  Gross per 24 hour  Intake   6085 ml  Output   1900 ml  Net   4185 ml    Filed Vitals:   03/23/11 0900  BP: 89/55  Pulse:   Temp:   Resp:    On Exam  She is alert afebrile and comfortable CVS: S1 S2 heard LUngs: clear Abdomen: soft non tender non distended bowel sounds heard.  Extremities: no pedal edema, cyanosis or clubbing.  Lab Results: Results for orders placed during the hospital encounter of 03/22/11 (from the past 24 hour(s))  PRO B NATRIURETIC PEPTIDE     Status: Normal   Collection Time   03/22/11 12:24 PM      Component Value Range   Pro B Natriuretic peptide (BNP) 327.1  0 - 450 (pg/mL)  CBC     Status: Abnormal   Collection Time   03/22/11  1:23 PM      Component Value Range   WBC 10.8 (*) 4.0 - 10.5 (K/uL)   RBC 2.88 (*) 3.87 - 5.11 (MIL/uL)   Hemoglobin 8.9 (*) 12.0 - 15.0 (g/dL)   HCT 16.1 (*) 09.6 - 46.0 (%)   MCV 87.8  78.0 - 100.0 (fL)   MCH 30.9  26.0 - 34.0 (pg)   MCHC 35.2  30.0 - 36.0 (g/dL)   RDW 04.5 (*) 40.9 - 15.5 (%)   Platelets 122 (*) 150 - 400 (K/uL)  PREPARE FRESH FROZEN PLASMA     Status: Normal   Collection Time   03/22/11  3:30 PM      Component Value Range   Unit Number [id number]     Blood Component Type THAWED PLASMA     Unit division 00     Status of Unit ISSUED,FINAL     Transfusion Status OK TO TRANSFUSE     Unit Number [id number]     Blood Component Type THAWED PLASMA     Unit division 00     Status of Unit ISSUED,FINAL     Transfusion Status OK TO TRANSFUSE    CK TOTAL AND CKMB     Status: Normal   Collection Time   03/22/11  3:37 PM      Component Value Range   Total CK 102  7 - 177 (U/L)   CK, MB 2.2  0.3 - 4.0  (ng/mL)   Relative Index 2.2  0.0 - 2.5   LACTIC ACID, PLASMA     Status: Abnormal   Collection Time   03/22/11  3:37 PM      Component Value Range   Lactic Acid, Venous 6.7 (*) 0.5 - 2.2 (mmol/L)  TSH     Status: Normal   Collection Time   03/22/11  3:38 PM      Component Value Range   TSH 1.440  0.350 - 4.500 (uIU/mL)  PROTIME-INR     Status: Abnormal   Collection Time   03/22/11  3:38 PM      Component Value Range   Prothrombin Time 46.4 (*) 11.6 - 15.2 (seconds)   INR 4.90 (*) 0.00 - 1.49   PREPARE RBC (CROSSMATCH)     Status: Normal   Collection Time   03/22/11  5:00 PM      Component Value Range   Order Confirmation ORDER PROCESSED BY BLOOD BANK    CBC     Status: Abnormal   Collection Time   03/22/11  8:15 PM      Component Value Range   WBC 10.0  4.0 - 10.5 (K/uL)   RBC 2.65 (*) 3.87 - 5.11 (MIL/uL)   Hemoglobin 8.0 (*) 12.0 - 15.0 (g/dL)   HCT 19.1 (*) 47.8 - 46.0 (%)   MCV 87.9  78.0 - 100.0 (fL)   MCH 30.2  26.0 - 34.0 (pg)   MCHC 34.3  30.0 - 36.0 (g/dL)   RDW 29.5 (*) 62.1 - 15.5 (%)   Platelets 115 (*) 150 - 400 (K/uL)  MRSA PCR SCREENING     Status: Normal   Collection Time   03/22/11  9:56 PM      Component Value Range   MRSA by PCR NEGATIVE  NEGATIVE   CBC     Status: Abnormal   Collection Time   03/23/11 12:35 AM      Component Value Range   WBC 9.8  4.0 - 10.5 (K/uL)   RBC 3.19 (*) 3.87 - 5.11 (MIL/uL)   Hemoglobin 9.7 (*) 12.0 - 15.0 (g/dL)   HCT 30.8 (*) 65.7 - 46.0 (%)   MCV 85.9  78.0 - 100.0 (fL)   MCH 30.4  26.0 - 34.0 (pg)   MCHC 35.4  30.0 - 36.0 (g/dL)   RDW 84.6 (*) 96.2 - 15.5 (%)   Platelets 105 (*) 150 - 400 (K/uL)  CARDIAC PANEL(CRET KIN+CKTOT+MB+TROPI)     Status: Normal   Collection Time   03/23/11 12:35 AM      Component Value Range   Total CK 122  7 - 177 (U/L)   CK, MB 2.5  0.3 - 4.0 (ng/mL)   Troponin I <0.30  <0.30 (ng/mL)   Relative Index 2.0  0.0 - 2.5   CBC     Status: Abnormal   Collection Time   03/23/11  5:45 AM       Component Value Range   WBC 8.8  4.0 - 10.5 (K/uL)   RBC 2.98 (*) 3.87 - 5.11 (MIL/uL)   Hemoglobin 9.2 (*) 12.0 - 15.0 (g/dL)   HCT 95.2 (*) 84.1 - 46.0 (%)   MCV 85.2  78.0 - 100.0 (fL)   MCH 30.9  26.0 - 34.0 (pg)   MCHC 36.2 (*) 30.0 - 36.0 (g/dL)   RDW 32.4 (*) 40.1 - 15.5 (%)   Platelets 111 (*) 150 - 400 (K/uL)  PROTIME-INR     Status: Abnormal   Collection Time   03/23/11  5:45 AM      Component Value Range   Prothrombin Time 23.8 (*) 11.6 - 15.2 (seconds)   INR 2.09 (*) 0.00 - 1.49   COMPREHENSIVE METABOLIC PANEL     Status: Abnormal   Collection Time   03/23/11  5:45 AM      Component Value Range   Sodium 138  135 - 145 (mEq/L)   Potassium 3.4 (*) 3.5 - 5.1 (mEq/L)   Chloride 105  96 - 112 (mEq/L)   CO2 23  19 - 32 (mEq/L)   Glucose, Bld 101 (*) 70 - 99 (mg/dL)   BUN 11  6 - 23 (mg/dL)   Creatinine, Ser 0.27  0.50 - 1.10 (mg/dL)   Calcium 8.3 (*) 8.4 - 10.5 (mg/dL)   Total Protein 5.8 (*) 6.0 - 8.3 (  g/dL)   Albumin 2.6 (*) 3.5 - 5.2 (g/dL)   AST 161 (*) 0 - 37 (U/L)   ALT 224 (*) 0 - 35 (U/L)   Alkaline Phosphatase 263 (*) 39 - 117 (U/L)   Total Bilirubin 4.4 (*) 0.3 - 1.2 (mg/dL)   GFR calc non Af Amer 79 (*) >90 (mL/min)   GFR calc Af Amer >90  >90 (mL/min)  CARDIAC PANEL(CRET KIN+CKTOT+MB+TROPI)     Status: Normal   Collection Time   03/23/11  8:30 AM      Component Value Range   Total CK 103  7 - 177 (U/L)   CK, MB 2.2  0.3 - 4.0 (ng/mL)   Troponin I <0.30  <0.30 (ng/mL)   Relative Index 2.1  0.0 - 2.5   CLOSTRIDIUM DIFFICILE BY PCR     Status: Normal   Collection Time   03/23/11  8:47 AM      Component Value Range   C difficile by pcr NEGATIVE  NEGATIVE      Micro Results: Recent Results (from the past 240 hour(s))  MRSA PCR SCREENING     Status: Normal   Collection Time   03/22/11  9:56 PM      Component Value Range Status Comment   MRSA by PCR NEGATIVE  NEGATIVE  Final   CLOSTRIDIUM DIFFICILE BY PCR     Status: Normal   Collection Time   03/23/11   8:47 AM      Component Value Range Status Comment   C difficile by pcr NEGATIVE  NEGATIVE  Final     Studies/Results: Dg Chest 1 View  03/22/2011  *RADIOLOGY REPORT*  Clinical Data: Short of breath.  Pulmonary fibrosis.  Pancreatic cancer.  CHEST - 1 VIEW  Comparison: 11/04/2010.  Findings: Patient is rotated to the right.  Right IJ Port-A- Cath/power port is unchanged.  Low lung volumes with basilar atelectasis.  No airspace disease.  No effusion.  Cardiopericardial silhouette is probably unchanged allowing for low inspiratory volumes.  IMPRESSION: Low volume chest.  Stable support apparatus.  No gross consolidation.  Original Report Authenticated By: Andreas Newport, M.D.   Ct Angio Chest W/cm &/or Wo Cm  03/22/2011  *RADIOLOGY REPORT*  Clinical Data:  Shortness of breath and rectal bleeding.  CT ANGIOGRAPHY CHEST CT ABDOMEN AND PELVIS WITH CONTRAST  Technique:  Multidetector CT imaging of the chest was performed using the standard protocol during bolus administration of intravenous contrast.  Multiplanar CT image reconstructions including MIPs were obtained to evaluate the vascular anatomy. Multidetector CT imaging of the abdomen and pelvis was performed using the standard protocol during bolus administration of intravenous contrast.  Contrast: OMNIPAQUE IOHEXOL 300 MG/ML IV SOLN  Comparison:  CT chest 02/10/2010.  CTA CHEST  Findings:  The chest wall is unremarkable.  No breast masses, supraclavicular or axillary lymphadenopathy.  A right-sided Port-A- Cath is noted.  The bony thorax is intact.  Lytic and sclerotic appearing bone changes suspicious for metastatic disease.  The heart is normal in size.  No pericardial effusion.  There are scattered mediastinal and hilar lymph nodes but no adenopathy.  The esophagus is grossly normal.  The aorta is tortuous and mildly ectatic with atherosclerotic changes but no dissection or focal aneurysm.  Dense coronary artery calcifications are noted.  The  pulmonary arterial tree is fairly well opacified.  No filling defects to suggest pulmonary emboli.  There is noted in the left brachiocephalic vein likely from the injection.  Examination  of the lung parenchyma demonstrates pulmonary scarring and peribronchial thickening.  Vague nodular lung lesions.  Cannot exclude metastatic disease.  No edema or effusions.   Review of the MIP images confirms the above findings.  IMPRESSION:  1.  No CT findings for pulmonary embolism. 2.  No mediastinal or hilar adenopathy. 3.  Scattered ill-defined pulmonary lesions.  Pulmonary metastatic disease is possible.  Close CT follow-up is recommended.  CT ABDOMEN AND PELVIS  Findings: A biliary stent is in place.  There is associated intrahepatic biliary air.  There is a large low attenuation area in the medial segment of the left hepatic lobe and also in part the right hepatic lobe.   This was not present on the prior examination.  It could be a treated lesion.  Vessels and the biliary tree run through this area and severe focal fat is possible.  Comparison with any more recent CT scan may be helpful. The gallbladder is markedly distended and gallbladder folds are noted.  Cystic duct obstruction is possible although no definite wall thickening or gallstones.  The pancreatic head demonstrates diffuse low attenuation without discrete mass.  The pancreatic body and tail are quite atrophied.  There is ill-defined soft tissue density surrounding the right side of the celiac access which is likely tumor.  Scattered small lymph nodes are noted also.  The spleen is normal in size.  No focal lesions.  The adrenal glands and kidneys are unremarkable.  The stomach is grossly normal.  The duodenum and small bowel are unremarkable.  The colon demonstrates severe and diffuse inflammation most consistent with C difficile colitis.  The surrounding inflammation and fluid.  The bladder has a Foley catheter in it.  Mild wall thickening. There is moderate  free pelvic fluid.  No pelvic mass or adenopathy.  Very heterogeneous appearance of the bone marrow.  Cannot exclude metastatic disease.  Review of the MIP images confirms the above findings.  IMPRESSION:  1.  Large area of low attenuation in the liver could be treated disease or severe focal fat.  Vessels and the biliary tree run through this area so I do not think it is mass.  MRI may be helpful for further evaluation. 2.  Marked distention of the gallbladder. 3.  Ill-defined low attenuation of the pancreatic head with adjacent soft tissue tumor and small lymph nodes. 4.  Severe diffuse inflammation of the colon most consistent with C difficile colitis. 5.  Moderate amount of free pelvic fluid. 6.  Biliary stent in place.  Original Report Authenticated By: P. Loralie Champagne, M.D.   US Abdomen Complete  03/22/2011  *RADIOLOGY REPORT*  Clinical Data:  Abdominal pain, new transaminitis.  History of pancreatic cancer status post biliary stent in 2011  COMPLETE ABDOMINAL ULTRASOUND  Comparison:  None.  Findings:  Gallbladder:  Gallbladder sludge.  No gallbladder wall thickening, pericholecystic fluid, or sonographic Murphy's sign.  Common bile duct:  Measures 10 mm.  Liver:  No focal lesion identified.  Pneumobilia, likely secondary to indwelling biliary stent.  IVC:  Appears normal.  Pancreas:  Poorly visualized.  Pancreatic duct stent in place.  Spleen:  Measures 8.1 cm.  Right Kidney:  Measures 9.6 cm.  No mass or hydronephrosis.  Left Kidney:  Measures 9.6 cm.  No mass or hydronephrosis.  Abdominal aorta:  No aneurysm identified.  Additional comments:  Ascites.  IMPRESSION: Pancreatic duct stent in place.  Secondary pneumobilia.  Gallbladder sludge, without associated findings to suggest acute cholecystitis.  Ascites.  Original Report Authenticated By: Charline Bills, M.D.   Ct Abdomen Pelvis W Contrast  03/22/2011  *RADIOLOGY REPORT*  Clinical Data:  Shortness of breath and rectal bleeding.  CT ANGIOGRAPHY  CHEST CT ABDOMEN AND PELVIS WITH CONTRAST  Technique:  Multidetector CT imaging of the chest was performed using the standard protocol during bolus administration of intravenous contrast.  Multiplanar CT image reconstructions including MIPs were obtained to evaluate the vascular anatomy. Multidetector CT imaging of the abdomen and pelvis was performed using the standard protocol during bolus administration of intravenous contrast.  Contrast: OMNIPAQUE IOHEXOL 300 MG/ML IV SOLN  Comparison:  CT chest 02/10/2010.  CTA CHEST  Findings:  The chest wall is unremarkable.  No breast masses, supraclavicular or axillary lymphadenopathy.  A right-sided Port-A- Cath is noted.  The bony thorax is intact.  Lytic and sclerotic appearing bone changes suspicious for metastatic disease.  The heart is normal in size.  No pericardial effusion.  There are scattered mediastinal and hilar lymph nodes but no adenopathy.  The esophagus is grossly normal.  The aorta is tortuous and mildly ectatic with atherosclerotic changes but no dissection or focal aneurysm.  Dense coronary artery calcifications are noted.  The pulmonary arterial tree is fairly well opacified.  No filling defects to suggest pulmonary emboli.  There is noted in the left brachiocephalic vein likely from the injection.  Examination of the lung parenchyma demonstrates pulmonary scarring and peribronchial thickening.  Vague nodular lung lesions.  Cannot exclude metastatic disease.  No edema or effusions.   Review of the MIP images confirms the above findings.  IMPRESSION:  1.  No CT findings for pulmonary embolism. 2.  No mediastinal or hilar adenopathy. 3.  Scattered ill-defined pulmonary lesions.  Pulmonary metastatic disease is possible.  Close CT follow-up is recommended.  CT ABDOMEN AND PELVIS  Findings: A biliary stent is in place.  There is associated intrahepatic biliary air.  There is a large low attenuation area in the medial segment of the left hepatic lobe  and also in part the right hepatic lobe.   This was not present on the prior examination.  It could be a treated lesion.  Vessels and the biliary tree run through this area and severe focal fat is possible.  Comparison with any more recent CT scan may be helpful. The gallbladder is markedly distended and gallbladder folds are noted.  Cystic duct obstruction is possible although no definite wall thickening or gallstones.  The pancreatic head demonstrates diffuse low attenuation without discrete mass.  The pancreatic body and tail are quite atrophied.  There is ill-defined soft tissue density surrounding the right side of the celiac access which is likely tumor.  Scattered small lymph nodes are noted also.  The spleen is normal in size.  No focal lesions.  The adrenal glands and kidneys are unremarkable.  The stomach is grossly normal.  The duodenum and small bowel are unremarkable.  The colon demonstrates severe and diffuse inflammation most consistent with C difficile colitis.  The surrounding inflammation and fluid.  The bladder has a Foley catheter in it.  Mild wall thickening. There is moderate free pelvic fluid.  No pelvic mass or adenopathy.  Very heterogeneous appearance of the bone marrow.  Cannot exclude metastatic disease.  Review of the MIP images confirms the above findings.  IMPRESSION:  1.  Large area of low attenuation in the liver could be treated disease or severe focal fat.  Vessels and the biliary tree run through  this area so I do not think it is mass.  MRI may be helpful for further evaluation. 2.  Marked distention of the gallbladder. 3.  Ill-defined low attenuation of the pancreatic head with adjacent soft tissue tumor and small lymph nodes. 4.  Severe diffuse inflammation of the colon most consistent with C difficile colitis. 5.  Moderate amount of free pelvic fluid. 6.  Biliary stent in place.  Original Report Authenticated By: P. Loralie Champagne, M.D.   Medications: Scheduled Meds:   .  donepezil  10 mg Oral QHS  . furosemide  40 mg Intravenous Once  . guaiFENesin  400 mg Oral Daily  . neomycin-polymyxin-hydrocortisone  3 drop Right Ear TID  . pantoprazole  40 mg Oral BID AC  . phytonadione (VITAMIN K) IV  5 mg Intravenous Once  . polyethylene glycol  17 g Oral Daily  . polyethylene glycol-electrolytes  4,000 mL Oral Once  . predniSONE  5 mg Oral Daily  . sodium chloride  250 mL Intravenous Once  . DISCONTD: polyethylene glycol powder  17 g Oral Daily   Continuous Infusions:   . sodium chloride 75 mL/hr at 03/23/11 0100  . 0.9 % NaCl with KCl 20 mEq / L 75 mL/hr at 03/23/11 1030   PRN Meds:.acetaminophen, acetaminophen, albuterol, ALPRAZolam, alum & mag hydroxide-simeth, iohexol, ondansetron (ZOFRAN) IV, ondansetron, simethicone  Assessment/Plan: Patient Active Hospital Problem List:  GI bleeding (03/22/2011) Her H&H appears stable around 9. But she continues to have blood tinged BM's , her INR is currently 2.09. Will order one more unit of FFP. She is going for colonoscopy in am.   Pancreatic adenocarcinoma:  (03/05/2010): CT abd shows recurrent pancreatic cancer with some pulmonary nodules consistent with metastatic disease, recomment outpatient follow up with [facility] with repeat scans. Ca 19 ordered.  status post biliary stent placement. on 02/17/2000. Status post  single-agent gemcitabine from 03/24/2010 through 06/01/2010. She  is currently undergoing surveillance, receiving periodic scans at  [facility]. She will be following up at [facility] later this month with scans  DVT of upper extremity (deep vein thrombosis) : anticoagulated with Coumadin. A Doppler of upper extremity was negative. The CT scans continue to demonstrate no progression of  disease. We will discontinue anticoagulation   Anemia of chronic disease:. The patient has received Aranesp in the  past. As her hemoglobin dropped to less than 10 , she might require Aranesp injections at her oncology office as  outpatient when she gets discharged. She also has a h/o MM.   Shortness of breath: resolved.cardian enzymes are negative. CT chest was negative for PE. 2D echocardiogram is pending.   Hypokalemia: will be repleted.   Mild Thrombocytopenia: stable continue to monitor.  Elevated Liver Function tests: Most likely secondary to hepatic lesions from the pancreatic adenocarcinoma. Will need further imaging to define the liver lesions, possibly MRI. Will leave it to GI to see if she needs further testing in hospital vs outpatient evaluation.   Full code   LOS: 1 day   Clerence Gubser 03/23/2011, 11:58 AM

## 2011-03-23 NOTE — Consult Note (Addendum)
IDENTIFYING STATEMENT: The patient is an 78 year old woman with an adenocarcinoma of the head of the pancreas who was admitted with GI bleeding.     INTERVAL HISTORY: Patient has a history of a a superficial thrombus in the basilic vein for which she is on anticoagulation. A Doppler of the right upper extremity had  Shown no evidence of DVT or superficial  thrombosis. She follows up periodically with me and Dr. Carlton Adam at [facility]. She receives periodic CT scans there.  Her daughter report gross rectal bleeding yesterday morning.  Patient admits to crampy abdominal pain, but denied nausea or vomiting.  On admission her hg was 10.9.  This morning she has no current complaints.  Nursing report guaiac positive and there has been no further frank bleeding.     MEDICATIONS:  Scheduled:   . sodium chloride   Intravenous Once  . donepezil  10 mg Oral QHS  . guaiFENesin  400 mg Oral Daily  . neomycin-polymyxin-hydrocortisone  3 drop Right Ear TID  . pantoprazole  40 mg Oral BID AC  . phytonadione (VITAMIN K) IV  5 mg Intravenous Once  . polyethylene glycol  17 g Oral Daily  . polyethylene glycol-electrolytes  4,000 mL Oral Once  . potassium chloride  40 mEq Oral BID  . predniSONE  5 mg Oral Daily  . sodium chloride  250 mL Intravenous Once  . DISCONTD: polyethylene glycol powder  17 g Oral Daily   Continuous:   . sodium chloride 75 mL/hr at 03/23/11 0100  . 0.9 % NaCl with KCl 20 mEq / L 75 mL/hr at 03/23/11 1030   JXB:[id number], acetaminophen, albuterol, ALPRAZolam, alum & mag hydroxide-simeth, iohexol, ondansetron (ZOFRAN) IV, ondansetron, simethicone  PHYSICAL EXAM  Vital signs in last 24 hours: Temp:  [97.5 F (36.4 C)-100.1 F (37.8 C)] 98 F (36.7 C) (01/09 1421) Pulse Rate:  [74-92] 77  (01/09 0600) Resp:  [13-32] 13  (01/09 0600) BP: (89-119)/(55-76) 99/62 mmHg (01/09 1421) SpO2:  [100 %] 100 % (01/09 0600) Weight:  [152 lb 12.5 oz (69.3 kg)-155 lb 3.3 oz (70.4  kg)] 152 lb 12.5 oz (69.3 kg) (01/09 0347)  Intake/Output from previous day: 01/08 0701 - 01/09 0700 In: 6085 [I.V.:3165; Blood:2190; IV Piggyback:50] Out: 2100 [Urine:2100] Intake/Output this shift: Total I/O In: 287 [Blood:287] Out: -   General appearance: alert, cooperative and appears older than stated age Resp: clear to auscultation bilaterally Cardio: regular rate and rhythm, S1, S2 normal, no murmur, click, rub or gallop GI: soft, non-tender; bowel sounds normal; no masses,  no organomegaly Extremities: + edema.  No calf tenderness.  LABS:  CBC    Component Value Date/Time   WBC 6.2 03/23/2011 1320   WBC 6.5 02/16/2011 1119   RBC 3.13* 03/23/2011 1320   RBC 3.47* 02/16/2011 1119   HGB 9.5* 03/23/2011 1320   HGB 10.9* 02/16/2011 1119   HCT 26.8* 03/23/2011 1320   HCT 32.7* 02/16/2011 1119   PLT 107* 03/23/2011 1320   PLT 149 02/16/2011 1119   MCV 85.6 03/23/2011 1320   MCV 94.1 02/16/2011 1119   MCH 30.4 03/23/2011 1320   MCH 31.4 02/16/2011 1119   MCHC 35.4 03/23/2011 1320   MCHC 33.4 02/16/2011 1119   RDW 18.3* 03/23/2011 1320   RDW 15.2* 02/16/2011 1119   LYMPHSABS 0.3* 02/16/2011 1119   LYMPHSABS 0.5* 10/28/2010 1237   MONOABS 0.5 02/16/2011 1119   MONOABS 0.4 10/28/2010 1237   EOSABS 0.1 02/16/2011 1119  EOSABS 0.1 10/28/2010 1237   BASOSABS 0.0 02/16/2011 1119   BASOSABS 0.0 10/28/2010 1237     Basename 03/23/11 0545 03/22/11 0437  NA 138 132*  K 3.4* 3.7  CL 105 98  CO2 23 17*  GLUCOSE 101* 159*  BUN 11 10  CREATININE 0.64 0.55  CALCIUM 8.3* 9.3    IMPRESSION AND PLAN: Heather Dorsey is an 78 year old woman with:  1. History of a localized adenocarcinoma of the head of the pancreas, status post biliary stent placement. on 02/17/2000. Status post single-agent gemcitabine from 03/24/2010 through 06/01/2010. She is currently undergoing surveillance.   2. History of a right basilic vein thrombosis for which she is  anticoagulated with Coumadin. A recent Doppler of upper extremity  was negative. In the presence of GI bleeding anticoagulation is contraindicated and I agree with discontinuing.   3. GI bleed - due for a colonoscopy   Renee Beale I., MD 03/23/2011

## 2011-03-23 NOTE — Progress Notes (Signed)
UR complete 

## 2011-03-23 NOTE — Progress Notes (Signed)
*  PRELIMINARY RESULTS* Echocardiogram 2D Echocardiogram has been performed.  Heather Dorsey [facility] 03/23/2011, 1:35 PM

## 2011-03-23 NOTE — Progress Notes (Signed)
Patient ID: Heather Dorsey, female   DOB: 1923/09/07, 78 y.o.   MRN: [id number] Subjective: The patient feels much better today.  No SOB complaints.  Nursing reports a very small blood mixed with stool bowel movement.  Objective: Vital signs in last 24 hours: Temp:  [98.1 F (36.7 C)-100.1 F (37.8 C)] 98.7 F (37.1 C) (01/09 0400) Pulse Rate:  [74-104] 77  (01/09 0600) Resp:  [13-32] 13  (01/09 0600) BP: (91-120)/(55-76) 101/55 mmHg (01/09 0600) SpO2:  [97 %-100 %] 100 % (01/09 0600) Weight:  [69.3 kg (152 lb 12.5 oz)-70.4 kg (155 lb 3.3 oz)] 69.3 kg (152 lb 12.5 oz) (01/09 0347) Last BM Date: 03/22/11  Intake/Output from previous day: 01/08 0701 - 01/09 0700 In: 6085 [I.V.:3165; Blood:2190; IV Piggyback:50] Out: 2100 [Urine:2100] Intake/Output this shift:    General appearance: alert and no distress GI: soft, non-tender; bowel sounds normal; no masses,  no organomegaly  Lab Results:  [facility] 03/23/11 0545 03/23/11 0035 03/22/11 2015  WBC 8.8 9.8 10.0  HGB 9.2* 9.7* 8.0*  HCT 25.4* 27.4* 23.3*  PLT 111* 105* 115*   BMET  Basename 03/23/11 0545 03/22/11 0437  NA 138 132*  K 3.4* 3.7  CL 105 98  CO2 23 17*  GLUCOSE 101* 159*  BUN 11 10  CREATININE 0.64 0.55  CALCIUM 8.3* 9.3   LFT  Basename 03/23/11 0545  PROT 5.8*  ALBUMIN 2.6*  AST 280*  ALT 224*  ALKPHOS 263*  BILITOT 4.4*  BILIDIR --  IBILI --   PT/INR  Basename 03/23/11 0545 03/22/11 1538  LABPROT 23.8* 46.4*  INR 2.09* 4.90*   Hepatitis Panel No results found for this basename: HEPBSAG,HCVAB,HEPAIGM,HEPBIGM in the last 72 hours C-Diff No results found for this basename: CDIFFTOX:3 in the last 72 hours Fecal Lactopherrin No results found for this basename: FECLLACTOFRN in the last 72 hours  Studies/Results: Dg Chest 1 View  03/22/2011  *RADIOLOGY REPORT*  Clinical Data: Short of breath.  Pulmonary fibrosis.  Pancreatic cancer.  CHEST - 1 VIEW  Comparison: 11/04/2010.  Findings: Patient is  rotated to the right.  Right IJ Port-A- Cath/power port is unchanged.  Low lung volumes with basilar atelectasis.  No airspace disease.  No effusion.  Cardiopericardial silhouette is probably unchanged allowing for low inspiratory volumes.  IMPRESSION: Low volume chest.  Stable support apparatus.  No gross consolidation.  Original Report Authenticated By: Andreas Newport, M.D.   Ct Angio Chest W/cm &/or Wo Cm  03/22/2011  *RADIOLOGY REPORT*  Clinical Data:  Shortness of breath and rectal bleeding.  CT ANGIOGRAPHY CHEST CT ABDOMEN AND PELVIS WITH CONTRAST  Technique:  Multidetector CT imaging of the chest was performed using the standard protocol during bolus administration of intravenous contrast.  Multiplanar CT image reconstructions including MIPs were obtained to evaluate the vascular anatomy. Multidetector CT imaging of the abdomen and pelvis was performed using the standard protocol during bolus administration of intravenous contrast.  Contrast: OMNIPAQUE IOHEXOL 300 MG/ML IV SOLN  Comparison:  CT chest 02/10/2010.  CTA CHEST  Findings:  The chest wall is unremarkable.  No breast masses, supraclavicular or axillary lymphadenopathy.  A right-sided Port-A- Cath is noted.  The bony thorax is intact.  Lytic and sclerotic appearing bone changes suspicious for metastatic disease.  The heart is normal in size.  No pericardial effusion.  There are scattered mediastinal and hilar lymph nodes but no adenopathy.  The esophagus is grossly normal.  The aorta is tortuous and mildly ectatic  with atherosclerotic changes but no dissection or focal aneurysm.  Dense coronary artery calcifications are noted.  The pulmonary arterial tree is fairly well opacified.  No filling defects to suggest pulmonary emboli.  There is noted in the left brachiocephalic vein likely from the injection.  Examination of the lung parenchyma demonstrates pulmonary scarring and peribronchial thickening.  Vague nodular lung lesions.  Cannot  exclude metastatic disease.  No edema or effusions.   Review of the MIP images confirms the above findings.  IMPRESSION:  1.  No CT findings for pulmonary embolism. 2.  No mediastinal or hilar adenopathy. 3.  Scattered ill-defined pulmonary lesions.  Pulmonary metastatic disease is possible.  Close CT follow-up is recommended.  CT ABDOMEN AND PELVIS  Findings: A biliary stent is in place.  There is associated intrahepatic biliary air.  There is a large low attenuation area in the medial segment of the left hepatic lobe and also in part the right hepatic lobe.   This was not present on the prior examination.  It could be a treated lesion.  Vessels and the biliary tree run through this area and severe focal fat is possible.  Comparison with any more recent CT scan may be helpful. The gallbladder is markedly distended and gallbladder folds are noted.  Cystic duct obstruction is possible although no definite wall thickening or gallstones.  The pancreatic head demonstrates diffuse low attenuation without discrete mass.  The pancreatic body and tail are quite atrophied.  There is ill-defined soft tissue density surrounding the right side of the celiac access which is likely tumor.  Scattered small lymph nodes are noted also.  The spleen is normal in size.  No focal lesions.  The adrenal glands and kidneys are unremarkable.  The stomach is grossly normal.  The duodenum and small bowel are unremarkable.  The colon demonstrates severe and diffuse inflammation most consistent with C difficile colitis.  The surrounding inflammation and fluid.  The bladder has a Foley catheter in it.  Mild wall thickening. There is moderate free pelvic fluid.  No pelvic mass or adenopathy.  Very heterogeneous appearance of the bone marrow.  Cannot exclude metastatic disease.  Review of the MIP images confirms the above findings.  IMPRESSION:  1.  Large area of low attenuation in the liver could be treated disease or severe focal fat.  Vessels  and the biliary tree run through this area so I do not think it is mass.  MRI may be helpful for further evaluation. 2.  Marked distention of the gallbladder. 3.  Ill-defined low attenuation of the pancreatic head with adjacent soft tissue tumor and small lymph nodes. 4.  Severe diffuse inflammation of the colon most consistent with C difficile colitis. 5.  Moderate amount of free pelvic fluid. 6.  Biliary stent in place.  Original Report Authenticated By: P. Loralie Champagne, M.D.   US Abdomen Complete  03/22/2011  *RADIOLOGY REPORT*  Clinical Data:  Abdominal pain, new transaminitis.  History of pancreatic cancer status post biliary stent in 2011  COMPLETE ABDOMINAL ULTRASOUND  Comparison:  None.  Findings:  Gallbladder:  Gallbladder sludge.  No gallbladder wall thickening, pericholecystic fluid, or sonographic Murphy's sign.  Common bile duct:  Measures 10 mm.  Liver:  No focal lesion identified.  Pneumobilia, likely secondary to indwelling biliary stent.  IVC:  Appears normal.  Pancreas:  Poorly visualized.  Pancreatic duct stent in place.  Spleen:  Measures 8.1 cm.  Right Kidney:  Measures 9.6 cm.  No  mass or hydronephrosis.  Left Kidney:  Measures 9.6 cm.  No mass or hydronephrosis.  Abdominal aorta:  No aneurysm identified.  Additional comments:  Ascites.  IMPRESSION: Pancreatic duct stent in place.  Secondary pneumobilia.  Gallbladder sludge, without associated findings to suggest acute cholecystitis.  Ascites.  Original Report Authenticated By: Charline Bills, M.D.   Ct Abdomen Pelvis W Contrast  03/22/2011  *RADIOLOGY REPORT*  Clinical Data:  Shortness of breath and rectal bleeding.  CT ANGIOGRAPHY CHEST CT ABDOMEN AND PELVIS WITH CONTRAST  Technique:  Multidetector CT imaging of the chest was performed using the standard protocol during bolus administration of intravenous contrast.  Multiplanar CT image reconstructions including MIPs were obtained to evaluate the vascular anatomy. Multidetector CT  imaging of the abdomen and pelvis was performed using the standard protocol during bolus administration of intravenous contrast.  Contrast: OMNIPAQUE IOHEXOL 300 MG/ML IV SOLN  Comparison:  CT chest 02/10/2010.  CTA CHEST  Findings:  The chest wall is unremarkable.  No breast masses, supraclavicular or axillary lymphadenopathy.  A right-sided Port-A- Cath is noted.  The bony thorax is intact.  Lytic and sclerotic appearing bone changes suspicious for metastatic disease.  The heart is normal in size.  No pericardial effusion.  There are scattered mediastinal and hilar lymph nodes but no adenopathy.  The esophagus is grossly normal.  The aorta is tortuous and mildly ectatic with atherosclerotic changes but no dissection or focal aneurysm.  Dense coronary artery calcifications are noted.  The pulmonary arterial tree is fairly well opacified.  No filling defects to suggest pulmonary emboli.  There is noted in the left brachiocephalic vein likely from the injection.  Examination of the lung parenchyma demonstrates pulmonary scarring and peribronchial thickening.  Vague nodular lung lesions.  Cannot exclude metastatic disease.  No edema or effusions.   Review of the MIP images confirms the above findings.  IMPRESSION:  1.  No CT findings for pulmonary embolism. 2.  No mediastinal or hilar adenopathy. 3.  Scattered ill-defined pulmonary lesions.  Pulmonary metastatic disease is possible.  Close CT follow-up is recommended.  CT ABDOMEN AND PELVIS  Findings: A biliary stent is in place.  There is associated intrahepatic biliary air.  There is a large low attenuation area in the medial segment of the left hepatic lobe and also in part the right hepatic lobe.   This was not present on the prior examination.  It could be a treated lesion.  Vessels and the biliary tree run through this area and severe focal fat is possible.  Comparison with any more recent CT scan may be helpful. The gallbladder is markedly distended and  gallbladder folds are noted.  Cystic duct obstruction is possible although no definite wall thickening or gallstones.  The pancreatic head demonstrates diffuse low attenuation without discrete mass.  The pancreatic body and tail are quite atrophied.  There is ill-defined soft tissue density surrounding the right side of the celiac access which is likely tumor.  Scattered small lymph nodes are noted also.  The spleen is normal in size.  No focal lesions.  The adrenal glands and kidneys are unremarkable.  The stomach is grossly normal.  The duodenum and small bowel are unremarkable.  The colon demonstrates severe and diffuse inflammation most consistent with C difficile colitis.  The surrounding inflammation and fluid.  The bladder has a Foley catheter in it.  Mild wall thickening. There is moderate free pelvic fluid.  No pelvic mass or adenopathy.  Very  heterogeneous appearance of the bone marrow.  Cannot exclude metastatic disease.  Review of the MIP images confirms the above findings.  IMPRESSION:  1.  Large area of low attenuation in the liver could be treated disease or severe focal fat.  Vessels and the biliary tree run through this area so I do not think it is mass.  MRI may be helpful for further evaluation. 2.  Marked distention of the gallbladder. 3.  Ill-defined low attenuation of the pancreatic head with adjacent soft tissue tumor and small lymph nodes. 4.  Severe diffuse inflammation of the colon most consistent with C difficile colitis. 5.  Moderate amount of free pelvic fluid. 6.  Biliary stent in place.  Original Report Authenticated By: P. Loralie Champagne, M.D.    Medications:  Scheduled:   . donepezil  10 mg Oral QHS  . furosemide  40 mg Intravenous Once  . guaiFENesin  400 mg Oral Daily  . neomycin-polymyxin-hydrocortisone  3 drop Right Ear TID  . pantoprazole  40 mg Oral BID AC  . phytonadione (VITAMIN K) IV  5 mg Intravenous Once  . polyethylene glycol  17 g Oral Daily  . predniSONE  5  mg Oral Daily  . sodium chloride  250 mL Intravenous Once  . DISCONTD: polyethylene glycol powder  17 g Oral Daily   Continuous:   . sodium chloride 75 mL/hr at 03/23/11 0100  . 0.9 % NaCl with KCl 20 mEq / L 75 mL/hr at 03/23/11 0400    Assessment/Plan: 1) Hematochezia - ? Etiology.  This may be diverticular in origin, but she has not had a prior colonoscopy. 2) Possible recurrent pancreatic cancer.   I will schedule her for a colonoscopy for further evaluation of the hematochezia issue.  Her HGB is relatively stable and from the cardiopulmonary standpoint she appears to be stable.  The CT scan does reveal the possibility of a possible recurrent pancreatic cancer, but this is not a certainty.  Plan: 1) Colonoscopy tomorrow. 2) Check Ca 19-9 to see if there is an increase to correlate with the CT scan findings.  LOS: 1 day   Jossette Zirbel D 03/23/2011, 7:36 AM

## 2011-03-24 ENCOUNTER — Other Ambulatory Visit: Payer: Medicare Other | Admitting: Lab

## 2011-03-24 ENCOUNTER — Other Ambulatory Visit: Payer: Self-pay | Admitting: Gastroenterology

## 2011-03-24 ENCOUNTER — Encounter (HOSPITAL_COMMUNITY): Payer: Self-pay

## 2011-03-24 ENCOUNTER — Encounter (HOSPITAL_COMMUNITY)
Admission: EM | Disposition: A | Discharge status: Skilled Nursing Facility | Payer: Self-pay | Source: Home / Self Care | Attending: Internal Medicine

## 2011-03-24 ENCOUNTER — Ambulatory Visit: Payer: Medicare Other

## 2011-03-24 HISTORY — PX: COLONOSCOPY: SHX5424

## 2011-03-24 LAB — CBC
HCT: 26.4 % — ABNORMAL LOW (ref 36.0–46.0)
Hemoglobin: 9.2 g/dL — ABNORMAL LOW (ref 12.0–15.0)
MCHC: 34.8 g/dL (ref 30.0–36.0)
RBC: 3.05 MIL/uL — ABNORMAL LOW (ref 3.87–5.11)

## 2011-03-24 LAB — COMPREHENSIVE METABOLIC PANEL
Alkaline Phosphatase: 237 U/L — ABNORMAL HIGH (ref 39–117)
BUN: 11 mg/dL (ref 6–23)
CO2: 20 mEq/L (ref 19–32)
GFR calc Af Amer: >90 mL/min (ref >90)
GFR calc non Af Amer: 79 mL/min — ABNORMAL LOW (ref >90)
Glucose, Bld: 80 mg/dL (ref 70–99)
Potassium: 4.3 mEq/L (ref 3.5–5.1)
Total Bilirubin: 2.7 mg/dL — ABNORMAL HIGH (ref 0.3–1.2)
Total Protein: 6.1 g/dL (ref 6.0–8.3)

## 2011-03-24 LAB — CANCER ANTIGEN 19-9: CA 19-9: 3.7 U/mL — ABNORMAL LOW (ref <35.0)

## 2011-03-24 LAB — MAGNESIUM: Magnesium: 2.2 mg/dL (ref 1.5–2.5)

## 2011-03-24 SURGERY — COLONOSCOPY
Anesthesia: Moderate Sedation

## 2011-03-24 MED ORDER — FENTANYL NICU IV SYRINGE 50 MCG/ML
INJECTION | INTRAMUSCULAR | Status: DC | PRN
  Administered 2011-03-24: 12.5 ug via INTRAVENOUS
  Administered 2011-03-24 (×2): 25 ug via INTRAVENOUS

## 2011-03-24 MED ORDER — MIDAZOLAM HCL 10 MG/2ML IJ SOLN
INTRAMUSCULAR | Status: DC | PRN
  Administered 2011-03-24 (×3): 1 mg via INTRAVENOUS

## 2011-03-24 MED ORDER — MIDAZOLAM HCL 10 MG/2ML IJ SOLN
INTRAMUSCULAR | Status: AC
  Filled 2011-03-24: qty 2

## 2011-03-24 MED ORDER — SODIUM CHLORIDE 0.9 % IV SOLN
INTRAVENOUS | Status: DC
  Administered 2011-03-24: 03:00:00 via INTRAVENOUS
  Administered 2011-03-24: 500 mL via INTRAVENOUS
  Administered 2011-03-24: 12:00:00 via INTRAVENOUS
  Administered 2011-03-25 (×2): 75 mL/h via INTRAVENOUS

## 2011-03-24 MED ORDER — FENTANYL CITRATE 0.05 MG/ML IJ SOLN
INTRAMUSCULAR | Status: AC
  Filled 2011-03-24: qty 2

## 2011-03-24 NOTE — Progress Notes (Signed)
Subjective: No new complaints. Daughter at bedside concerned about the infiltrated iv line. Answered all her questions.   Objective: Weight change: 1.9 kg (4 lb 3 oz)  Intake/Output Summary (Last 24 hours) at 03/24/11 1340 Last data filed at 03/24/11 1245  Gross per 24 hour  Intake   3347 ml  Output    352 ml  Net   2995 ml    Filed Vitals:   03/24/11 1200  BP:   Pulse:   Temp: 98.5 F (36.9 C)  Resp:   On Exam  She is alert afebrile and comfortable  CVS: S1 S2 heard  LUngs: clear  Abdomen: soft non tender non distended bowel sounds heard.  Extremities: no pedal edema, cyanosis or clubbing    Lab Results: Results for orders placed during the hospital encounter of 03/22/11 (from the past 24 hour(s))  CARDIAC PANEL(CRET KIN+CKTOT+MB+TROPI)     Status: Normal   Collection Time   03/23/11  6:15 PM      Component Value Range   Total CK 89  7 - 177 (U/L)   CK, MB 2.2  0.3 - 4.0 (ng/mL)   Troponin I <0.30  <0.30 (ng/mL)   Relative Index RELATIVE INDEX IS INVALID  0.0 - 2.5   CBC     Status: Abnormal   Collection Time   03/24/11  5:20 AM      Component Value Range   WBC 5.7  4.0 - 10.5 (K/uL)   RBC 3.05 (*) 3.87 - 5.11 (MIL/uL)   Hemoglobin 9.2 (*) 12.0 - 15.0 (g/dL)   HCT 16.1 (*) 09.6 - 46.0 (%)   MCV 86.6  78.0 - 100.0 (fL)   MCH 30.2  26.0 - 34.0 (pg)   MCHC 34.8  30.0 - 36.0 (g/dL)   RDW 04.5 (*) 40.9 - 15.5 (%)   Platelets 107 (*) 150 - 400 (K/uL)  COMPREHENSIVE METABOLIC PANEL     Status: Abnormal   Collection Time   03/24/11  5:20 AM      Component Value Range   Sodium 138  135 - 145 (mEq/L)   Potassium 4.3  3.5 - 5.1 (mEq/L)   Chloride 109  96 - 112 (mEq/L)   CO2 20  19 - 32 (mEq/L)   Glucose, Bld 80  70 - 99 (mg/dL)   BUN 11  6 - 23 (mg/dL)   Creatinine, Ser 8.11  0.50 - 1.10 (mg/dL)   Calcium 8.6  8.4 - 91.4 (mg/dL)   Total Protein 6.1  6.0 - 8.3 (g/dL)   Albumin 2.7 (*) 3.5 - 5.2 (g/dL)   AST 782 (*) 0 - 37 (U/L)   ALT 160 (*) 0 - 35 (U/L)   Alkaline Phosphatase 237 (*) 39 - 117 (U/L)   Total Bilirubin 2.7 (*) 0.3 - 1.2 (mg/dL)   GFR calc non Af Amer 79 (*) >90 (mL/min)   GFR calc Af Amer >90  >90 (mL/min)  MAGNESIUM     Status: Normal   Collection Time   03/24/11  5:20 AM      Component Value Range   Magnesium 2.2  1.5 - 2.5 (mg/dL)     Micro Results: Recent Results (from the past 240 hour(s))  MRSA PCR SCREENING     Status: Normal   Collection Time   03/22/11  9:56 PM      Component Value Range Status Comment   MRSA by PCR NEGATIVE  NEGATIVE  Final   CLOSTRIDIUM DIFFICILE BY PCR  Status: Normal   Collection Time   03/23/11  8:47 AM      Component Value Range Status Comment   C difficile by pcr NEGATIVE  NEGATIVE  Final     Studies/Results: Dg Chest 1 View  03/22/2011  *RADIOLOGY REPORT*  Clinical Data: Short of breath.  Pulmonary fibrosis.  Pancreatic cancer.  CHEST - 1 VIEW  Comparison: 11/04/2010.  Findings: Patient is rotated to the right.  Right IJ Port-A- Cath/power port is unchanged.  Low lung volumes with basilar atelectasis.  No airspace disease.  No effusion.  Cardiopericardial silhouette is probably unchanged allowing for low inspiratory volumes.  IMPRESSION: Low volume chest.  Stable support apparatus.  No gross consolidation.  Original Report Authenticated By: Andreas Newport, M.D.   Ct Angio Chest W/cm &/or Wo Cm  03/22/2011  *RADIOLOGY REPORT*  Clinical Data:  Shortness of breath and rectal bleeding.  CT ANGIOGRAPHY CHEST CT ABDOMEN AND PELVIS WITH CONTRAST  Technique:  Multidetector CT imaging of the chest was performed using the standard protocol during bolus administration of intravenous contrast.  Multiplanar CT image reconstructions including MIPs were obtained to evaluate the vascular anatomy. Multidetector CT imaging of the abdomen and pelvis was performed using the standard protocol during bolus administration of intravenous contrast.  Contrast: OMNIPAQUE IOHEXOL 300 MG/ML IV SOLN  Comparison:  CT  chest 02/10/2010.  CTA CHEST  Findings:  The chest wall is unremarkable.  No breast masses, supraclavicular or axillary lymphadenopathy.  A right-sided Port-A- Cath is noted.  The bony thorax is intact.  Lytic and sclerotic appearing bone changes suspicious for metastatic disease.  The heart is normal in size.  No pericardial effusion.  There are scattered mediastinal and hilar lymph nodes but no adenopathy.  The esophagus is grossly normal.  The aorta is tortuous and mildly ectatic with atherosclerotic changes but no dissection or focal aneurysm.  Dense coronary artery calcifications are noted.  The pulmonary arterial tree is fairly well opacified.  No filling defects to suggest pulmonary emboli.  There is noted in the left brachiocephalic vein likely from the injection.  Examination of the lung parenchyma demonstrates pulmonary scarring and peribronchial thickening.  Vague nodular lung lesions.  Cannot exclude metastatic disease.  No edema or effusions.   Review of the MIP images confirms the above findings.  IMPRESSION:  1.  No CT findings for pulmonary embolism. 2.  No mediastinal or hilar adenopathy. 3.  Scattered ill-defined pulmonary lesions.  Pulmonary metastatic disease is possible.  Close CT follow-up is recommended.  CT ABDOMEN AND PELVIS  Findings: A biliary stent is in place.  There is associated intrahepatic biliary air.  There is a large low attenuation area in the medial segment of the left hepatic lobe and also in part the right hepatic lobe.   This was not present on the prior examination.  It could be a treated lesion.  Vessels and the biliary tree run through this area and severe focal fat is possible.  Comparison with any more recent CT scan may be helpful. The gallbladder is markedly distended and gallbladder folds are noted.  Cystic duct obstruction is possible although no definite wall thickening or gallstones.  The pancreatic head demonstrates diffuse low attenuation without discrete mass.   The pancreatic body and tail are quite atrophied.  There is ill-defined soft tissue density surrounding the right side of the celiac access which is likely tumor.  Scattered small lymph nodes are noted also.  The spleen is normal in  size.  No focal lesions.  The adrenal glands and kidneys are unremarkable.  The stomach is grossly normal.  The duodenum and small bowel are unremarkable.  The colon demonstrates severe and diffuse inflammation most consistent with C difficile colitis.  The surrounding inflammation and fluid.  The bladder has a Foley catheter in it.  Mild wall thickening. There is moderate free pelvic fluid.  No pelvic mass or adenopathy.  Very heterogeneous appearance of the bone marrow.  Cannot exclude metastatic disease.  Review of the MIP images confirms the above findings.  IMPRESSION:  1.  Large area of low attenuation in the liver could be treated disease or severe focal fat.  Vessels and the biliary tree run through this area so I do not think it is mass.  MRI may be helpful for further evaluation. 2.  Marked distention of the gallbladder. 3.  Ill-defined low attenuation of the pancreatic head with adjacent soft tissue tumor and small lymph nodes. 4.  Severe diffuse inflammation of the colon most consistent with C difficile colitis. 5.  Moderate amount of free pelvic fluid. 6.  Biliary stent in place.  Original Report Authenticated By: P. Loralie Champagne, M.D.   US Abdomen Complete  03/22/2011  *RADIOLOGY REPORT*  Clinical Data:  Abdominal pain, new transaminitis.  History of pancreatic cancer status post biliary stent in 2011  COMPLETE ABDOMINAL ULTRASOUND  Comparison:  None.  Findings:  Gallbladder:  Gallbladder sludge.  No gallbladder wall thickening, pericholecystic fluid, or sonographic Murphy's sign.  Common bile duct:  Measures 10 mm.  Liver:  No focal lesion identified.  Pneumobilia, likely secondary to indwelling biliary stent.  IVC:  Appears normal.  Pancreas:  Poorly visualized.   Pancreatic duct stent in place.  Spleen:  Measures 8.1 cm.  Right Kidney:  Measures 9.6 cm.  No mass or hydronephrosis.  Left Kidney:  Measures 9.6 cm.  No mass or hydronephrosis.  Abdominal aorta:  No aneurysm identified.  Additional comments:  Ascites.  IMPRESSION: Pancreatic duct stent in place.  Secondary pneumobilia.  Gallbladder sludge, without associated findings to suggest acute cholecystitis.  Ascites.  Original Report Authenticated By: Charline Bills, M.D.   Ct Abdomen Pelvis W Contrast  03/22/2011  *RADIOLOGY REPORT*  Clinical Data:  Shortness of breath and rectal bleeding.  CT ANGIOGRAPHY CHEST CT ABDOMEN AND PELVIS WITH CONTRAST  Technique:  Multidetector CT imaging of the chest was performed using the standard protocol during bolus administration of intravenous contrast.  Multiplanar CT image reconstructions including MIPs were obtained to evaluate the vascular anatomy. Multidetector CT imaging of the abdomen and pelvis was performed using the standard protocol during bolus administration of intravenous contrast.  Contrast: OMNIPAQUE IOHEXOL 300 MG/ML IV SOLN  Comparison:  CT chest 02/10/2010.  CTA CHEST  Findings:  The chest wall is unremarkable.  No breast masses, supraclavicular or axillary lymphadenopathy.  A right-sided Port-A- Cath is noted.  The bony thorax is intact.  Lytic and sclerotic appearing bone changes suspicious for metastatic disease.  The heart is normal in size.  No pericardial effusion.  There are scattered mediastinal and hilar lymph nodes but no adenopathy.  The esophagus is grossly normal.  The aorta is tortuous and mildly ectatic with atherosclerotic changes but no dissection or focal aneurysm.  Dense coronary artery calcifications are noted.  The pulmonary arterial tree is fairly well opacified.  No filling defects to suggest pulmonary emboli.  There is noted in the left brachiocephalic vein likely from the injection.  Examination of the lung parenchyma demonstrates  pulmonary scarring and peribronchial thickening.  Vague nodular lung lesions.  Cannot exclude metastatic disease.  No edema or effusions.   Review of the MIP images confirms the above findings.  IMPRESSION:  1.  No CT findings for pulmonary embolism. 2.  No mediastinal or hilar adenopathy. 3.  Scattered ill-defined pulmonary lesions.  Pulmonary metastatic disease is possible.  Close CT follow-up is recommended.  CT ABDOMEN AND PELVIS  Findings: A biliary stent is in place.  There is associated intrahepatic biliary air.  There is a large low attenuation area in the medial segment of the left hepatic lobe and also in part the right hepatic lobe.   This was not present on the prior examination.  It could be a treated lesion.  Vessels and the biliary tree run through this area and severe focal fat is possible.  Comparison with any more recent CT scan may be helpful. The gallbladder is markedly distended and gallbladder folds are noted.  Cystic duct obstruction is possible although no definite wall thickening or gallstones.  The pancreatic head demonstrates diffuse low attenuation without discrete mass.  The pancreatic body and tail are quite atrophied.  There is ill-defined soft tissue density surrounding the right side of the celiac access which is likely tumor.  Scattered small lymph nodes are noted also.  The spleen is normal in size.  No focal lesions.  The adrenal glands and kidneys are unremarkable.  The stomach is grossly normal.  The duodenum and small bowel are unremarkable.  The colon demonstrates severe and diffuse inflammation most consistent with C difficile colitis.  The surrounding inflammation and fluid.  The bladder has a Foley catheter in it.  Mild wall thickening. There is moderate free pelvic fluid.  No pelvic mass or adenopathy.  Very heterogeneous appearance of the bone marrow.  Cannot exclude metastatic disease.  Review of the MIP images confirms the above findings.  IMPRESSION:  1.  Large area of  low attenuation in the liver could be treated disease or severe focal fat.  Vessels and the biliary tree run through this area so I do not think it is mass.  MRI may be helpful for further evaluation. 2.  Marked distention of the gallbladder. 3.  Ill-defined low attenuation of the pancreatic head with adjacent soft tissue tumor and small lymph nodes. 4.  Severe diffuse inflammation of the colon most consistent with C difficile colitis. 5.  Moderate amount of free pelvic fluid. 6.  Biliary stent in place.  Original Report Authenticated By: P. Loralie Champagne, M.D.   Medications: Scheduled Meds:   . donepezil  10 mg Oral QHS  . guaiFENesin  400 mg Oral Daily  . neomycin-polymyxin-hydrocortisone  3 drop Right Ear TID  . pantoprazole  40 mg Oral BID AC  . polyethylene glycol  17 g Oral Daily  . polyethylene glycol-electrolytes  4,000 mL Oral Once  . potassium chloride  40 mEq Oral BID  . predniSONE  5 mg Oral Daily  . sodium chloride  250 mL Intravenous Once   Continuous Infusions:   . sodium chloride 75 mL/hr at 03/24/11 1208  . 0.9 % NaCl with KCl 20 mEq / L 75 mL/hr at 03/23/11 1030   PRN Meds:.acetaminophen, acetaminophen, albuterol, ALPRAZolam, alum & mag hydroxide-simeth, ondansetron (ZOFRAN) IV, ondansetron, simethicone  Assessment/Plan: Patient Active Hospital Problem List:  GI bleeding (03/22/2011) Her H&H appears stable around 9.  No moe bloody bowel movements. , her last  INR is 2.09.one unit of FFP given yesterday. She is going for colonoscopy this afternoon.    Pancreatic adenocarcinoma: (03/05/2010): CT abd shows recurrent pancreatic cancer with some pulmonary nodules consistent with metastatic disease, recomment outpatient follow up with [facility] with repeat scans. Ca 19 ordered.  status post biliary stent placement. on 02/17/2000. Status post  single-agent gemcitabine from 03/24/2010 through 06/01/2010. She  is currently undergoing surveillance, receiving periodic scans at  [facility].  She will be following up at [facility] later this month with scans   DVT of upper extremity (deep vein thrombosis) : anticoagulated with Coumadin. A Doppler of upper extremity was negative. The CT scans continue to demonstrate no progression of  disease. We will discontinue anticoagulation   Anemia of chronic disease:. The patient has received Aranesp in the  past. As her hemoglobin dropped to less than 10 , she might require Aranesp injections at her oncology office as outpatient when she gets discharged. She also has a h/o MM.  Shortness of breath: resolved.cardian enzymes are negative. CT chest was negative for PE. 2D echocardiogram is done does not show any regional wall abnormalities.  Hypokalemia:  repleted.  Mild Thrombocytopenia: stable continue to monitor.  Elevated Liver Function tests: Most likely secondary to hepatic lesions from the pancreatic adenocarcinoma. Will need further imaging to define the liver lesions, possibly MRI. Will leave it to GI to see if she needs further testing in hospital vs outpatient evaluation.      LOS: 2 days   Pascha Fogal 03/24/2011, 1:40 PM

## 2011-03-24 NOTE — Op Note (Signed)
[facility] [address] [place], [place]  [address]  OPERATIVE PROCEDURE REPORT  PATIENT:  Heather Dorsey, Heather Dorsey  MR#:  [id number] BIRTHDATE:  October 29, 1923  GENDER:  female ENDOSCOPIST:  Jeani Hawking, MD PROCEDURE DATE:  03/24/2011 PROCEDURE:  Colonoscopy with biopsy ASA CLASS:  Class III INDICATIONS:  Hematochezia MEDICATIONS:  Fentanyl 62.5 mcg IV, Versed 3 mg IV  DESCRIPTION OF PROCEDURE:   After the risks benefits and alternatives of the procedure were thoroughly explained, informed consent was obtained.  Digital rectal exam was performed and revealed no abnormalities.   The  endoscope was introduced through the anus and advanced to the cecum, which was identified by both the appendix and ileocecal valve, without limitations.  The quality of the prep was excellent..  The instrument was then slowly withdrawn as the colon was fully examined. <<PROCEDUREIMAGES>>  FINDINGS:  The colon was very difficult to traverse as it was quite floppy. A 1 cm hepatic flexure polyp and an 8 mm sessile descending colon polyp was identified. A small 2-3 mm cecal polyp and a few other smaller colonic polyps were noted. The polyps were not removed given the possibility of a recurrent pancreatic cancer and her age/comorbidities. In the rectum there was evidence of an inflammation and cold biopsies were obtained. It is not certain if this represents a true inflammation versus prep irritation. Retroflexed views in the rectum revealed internal and external hemorrhoids.    The scope was then withdrawn from the patient and the procedure terminated.  COMPLICATIONS:  None  IMPRESSION:  1) Polyp, multiple 2) Internal and external hemorrhoids 3) ? Proctitis RECOMMENDATIONS:  1) Await biopsy results. 2) Avoid coumadin.  ______________________________ Jeani Hawking, MD  n. Rosalie DoctorJeani Hawking at 03/24/2011 03:08 PM  Quentin Mulling, [id number]

## 2011-03-24 NOTE — H&P (View-Only) (Signed)
Subjective: No new complaints. Daughter at bedside concerned about the infiltrated iv line. Answered all her questions.   Objective: Weight change: 1.9 kg (4 lb 3 oz)  Intake/Output Summary (Last 24 hours) at 03/24/11 1340 Last data filed at 03/24/11 1245  Gross per 24 hour  Intake   3347 ml  Output    352 ml  Net   2995 ml    Filed Vitals:   03/24/11 1200  BP:   Pulse:   Temp: 98.5 F (36.9 C)  Resp:   On Exam  She is alert afebrile and comfortable  CVS: S1 S2 heard  LUngs: clear  Abdomen: soft non tender non distended bowel sounds heard.  Extremities: no pedal edema, cyanosis or clubbing    Lab Results: Results for orders placed during the hospital encounter of 03/22/11 (from the past 24 hour(s))  CARDIAC PANEL(CRET KIN+CKTOT+MB+TROPI)     Status: Normal   Collection Time   03/23/11  6:15 PM      Component Value Range   Total CK 89  7 - 177 (U/L)   CK, MB 2.2  0.3 - 4.0 (ng/mL)   Troponin I <0.30  <0.30 (ng/mL)   Relative Index RELATIVE INDEX IS INVALID  0.0 - 2.5   CBC     Status: Abnormal   Collection Time   03/24/11  5:20 AM      Component Value Range   WBC 5.7  4.0 - 10.5 (K/uL)   RBC 3.05 (*) 3.87 - 5.11 (MIL/uL)   Hemoglobin 9.2 (*) 12.0 - 15.0 (g/dL)   HCT 26.4 (*) 36.0 - 46.0 (%)   MCV 86.6  78.0 - 100.0 (fL)   MCH 30.2  26.0 - 34.0 (pg)   MCHC 34.8  30.0 - 36.0 (g/dL)   RDW 18.3 (*) 11.5 - 15.5 (%)   Platelets 107 (*) 150 - 400 (K/uL)  COMPREHENSIVE METABOLIC PANEL     Status: Abnormal   Collection Time   03/24/11  5:20 AM      Component Value Range   Sodium 138  135 - 145 (mEq/L)   Potassium 4.3  3.5 - 5.1 (mEq/L)   Chloride 109  96 - 112 (mEq/L)   CO2 20  19 - 32 (mEq/L)   Glucose, Bld 80  70 - 99 (mg/dL)   BUN 11  6 - 23 (mg/dL)   Creatinine, Ser 0.65  0.50 - 1.10 (mg/dL)   Calcium 8.6  8.4 - 10.5 (mg/dL)   Total Protein 6.1  6.0 - 8.3 (g/dL)   Albumin 2.7 (*) 3.5 - 5.2 (g/dL)   AST 127 (*) 0 - 37 (U/L)   ALT 160 (*) 0 - 35 (U/L)   Alkaline Phosphatase 237 (*) 39 - 117 (U/L)   Total Bilirubin 2.7 (*) 0.3 - 1.2 (mg/dL)   GFR calc non Af Amer 79 (*) >90 (mL/min)   GFR calc Af Amer >90  >90 (mL/min)  MAGNESIUM     Status: Normal   Collection Time   03/24/11  5:20 AM      Component Value Range   Magnesium 2.2  1.5 - 2.5 (mg/dL)     Micro Results: Recent Results (from the past 240 hour(s))  MRSA PCR SCREENING     Status: Normal   Collection Time   03/22/11  9:56 PM      Component Value Range Status Comment   MRSA by PCR NEGATIVE  NEGATIVE  Final   CLOSTRIDIUM DIFFICILE BY PCR       Status: Normal   Collection Time   03/23/11  8:47 AM      Component Value Range Status Comment   C difficile by pcr NEGATIVE  NEGATIVE  Final     Studies/Results: Dg Chest 1 View  03/22/2011  *RADIOLOGY REPORT*  Clinical Data: Short of breath.  Pulmonary fibrosis.  Pancreatic cancer.  CHEST - 1 VIEW  Comparison: 11/04/2010.  Findings: Patient is rotated to the right.  Right IJ Port-A- Cath/power port is unchanged.  Low lung volumes with basilar atelectasis.  No airspace disease.  No effusion.  Cardiopericardial silhouette is probably unchanged allowing for low inspiratory volumes.  IMPRESSION: Low volume chest.  Stable support apparatus.  No gross consolidation.  Original Report Authenticated By: GEOFFREY LAMKE, M.D.   Ct Angio Chest W/cm &/or Wo Cm  03/22/2011  *RADIOLOGY REPORT*  Clinical Data:  Shortness of breath and rectal bleeding.  CT ANGIOGRAPHY CHEST CT ABDOMEN AND PELVIS WITH CONTRAST  Technique:  Multidetector CT imaging of the chest was performed using the standard protocol during bolus administration of intravenous contrast.  Multiplanar CT image reconstructions including MIPs were obtained to evaluate the vascular anatomy. Multidetector CT imaging of the abdomen and pelvis was performed using the standard protocol during bolus administration of intravenous contrast.  Contrast: 100mL OMNIPAQUE IOHEXOL 300 MG/ML IV SOLN  Comparison:  CT  chest 02/10/2010.  CTA CHEST  Findings:  The chest wall is unremarkable.  No breast masses, supraclavicular or axillary lymphadenopathy.  A right-sided Port-A- Cath is noted.  The bony thorax is intact.  Lytic and sclerotic appearing bone changes suspicious for metastatic disease.  The heart is normal in size.  No pericardial effusion.  There are scattered mediastinal and hilar lymph nodes but no adenopathy.  The esophagus is grossly normal.  The aorta is tortuous and mildly ectatic with atherosclerotic changes but no dissection or focal aneurysm.  Dense coronary artery calcifications are noted.  The pulmonary arterial tree is fairly well opacified.  No filling defects to suggest pulmonary emboli.  There is noted in the left brachiocephalic vein likely from the injection.  Examination of the lung parenchyma demonstrates pulmonary scarring and peribronchial thickening.  Vague nodular lung lesions.  Cannot exclude metastatic disease.  No edema or effusions.   Review of the MIP images confirms the above findings.  IMPRESSION:  1.  No CT findings for pulmonary embolism. 2.  No mediastinal or hilar adenopathy. 3.  Scattered ill-defined pulmonary lesions.  Pulmonary metastatic disease is possible.  Close CT follow-up is recommended.  CT ABDOMEN AND PELVIS  Findings: A biliary stent is in place.  There is associated intrahepatic biliary air.  There is a large low attenuation area in the medial segment of the left hepatic lobe and also in part the right hepatic lobe.   This was not present on the prior examination.  It could be a treated lesion.  Vessels and the biliary tree run through this area and severe focal fat is possible.  Comparison with any more recent CT scan may be helpful. The gallbladder is markedly distended and gallbladder folds are noted.  Cystic duct obstruction is possible although no definite wall thickening or gallstones.  The pancreatic head demonstrates diffuse low attenuation without discrete mass.   The pancreatic body and tail are quite atrophied.  There is ill-defined soft tissue density surrounding the right side of the celiac access which is likely tumor.  Scattered small lymph nodes are noted also.  The spleen is normal in   size.  No focal lesions.  The adrenal glands and kidneys are unremarkable.  The stomach is grossly normal.  The duodenum and small bowel are unremarkable.  The colon demonstrates severe and diffuse inflammation most consistent with C difficile colitis.  The surrounding inflammation and fluid.  The bladder has a Foley catheter in it.  Mild wall thickening. There is moderate free pelvic fluid.  No pelvic mass or adenopathy.  Very heterogeneous appearance of the bone marrow.  Cannot exclude metastatic disease.  Review of the MIP images confirms the above findings.  IMPRESSION:  1.  Large area of low attenuation in the liver could be treated disease or severe focal fat.  Vessels and the biliary tree run through this area so I do not think it is mass.  MRI may be helpful for further evaluation. 2.  Marked distention of the gallbladder. 3.  Ill-defined low attenuation of the pancreatic head with adjacent soft tissue tumor and small lymph nodes. 4.  Severe diffuse inflammation of the colon most consistent with C difficile colitis. 5.  Moderate amount of free pelvic fluid. 6.  Biliary stent in place.  Original Report Authenticated By: P. MARK GALLERANI, M.D.   Us Abdomen Complete  03/22/2011  *RADIOLOGY REPORT*  Clinical Data:  Abdominal pain, new transaminitis.  History of pancreatic cancer status post biliary stent in 2011  COMPLETE ABDOMINAL ULTRASOUND  Comparison:  None.  Findings:  Gallbladder:  Gallbladder sludge.  No gallbladder wall thickening, pericholecystic fluid, or sonographic Murphy's sign.  Common bile duct:  Measures 10 mm.  Liver:  No focal lesion identified.  Pneumobilia, likely secondary to indwelling biliary stent.  IVC:  Appears normal.  Pancreas:  Poorly visualized.   Pancreatic duct stent in place.  Spleen:  Measures 8.1 cm.  Right Kidney:  Measures 9.6 cm.  No mass or hydronephrosis.  Left Kidney:  Measures 9.6 cm.  No mass or hydronephrosis.  Abdominal aorta:  No aneurysm identified.  Additional comments:  Ascites.  IMPRESSION: Pancreatic duct stent in place.  Secondary pneumobilia.  Gallbladder sludge, without associated findings to suggest acute cholecystitis.  Ascites.  Original Report Authenticated By: SRIYESH KRISHNAN, M.D.   Ct Abdomen Pelvis W Contrast  03/22/2011  *RADIOLOGY REPORT*  Clinical Data:  Shortness of breath and rectal bleeding.  CT ANGIOGRAPHY CHEST CT ABDOMEN AND PELVIS WITH CONTRAST  Technique:  Multidetector CT imaging of the chest was performed using the standard protocol during bolus administration of intravenous contrast.  Multiplanar CT image reconstructions including MIPs were obtained to evaluate the vascular anatomy. Multidetector CT imaging of the abdomen and pelvis was performed using the standard protocol during bolus administration of intravenous contrast.  Contrast: 100mL OMNIPAQUE IOHEXOL 300 MG/ML IV SOLN  Comparison:  CT chest 02/10/2010.  CTA CHEST  Findings:  The chest wall is unremarkable.  No breast masses, supraclavicular or axillary lymphadenopathy.  A right-sided Port-A- Cath is noted.  The bony thorax is intact.  Lytic and sclerotic appearing bone changes suspicious for metastatic disease.  The heart is normal in size.  No pericardial effusion.  There are scattered mediastinal and hilar lymph nodes but no adenopathy.  The esophagus is grossly normal.  The aorta is tortuous and mildly ectatic with atherosclerotic changes but no dissection or focal aneurysm.  Dense coronary artery calcifications are noted.  The pulmonary arterial tree is fairly well opacified.  No filling defects to suggest pulmonary emboli.  There is noted in the left brachiocephalic vein likely from the injection.    Examination of the lung parenchyma demonstrates  pulmonary scarring and peribronchial thickening.  Vague nodular lung lesions.  Cannot exclude metastatic disease.  No edema or effusions.   Review of the MIP images confirms the above findings.  IMPRESSION:  1.  No CT findings for pulmonary embolism. 2.  No mediastinal or hilar adenopathy. 3.  Scattered ill-defined pulmonary lesions.  Pulmonary metastatic disease is possible.  Close CT follow-up is recommended.  CT ABDOMEN AND PELVIS  Findings: A biliary stent is in place.  There is associated intrahepatic biliary air.  There is a large low attenuation area in the medial segment of the left hepatic lobe and also in part the right hepatic lobe.   This was not present on the prior examination.  It could be a treated lesion.  Vessels and the biliary tree run through this area and severe focal fat is possible.  Comparison with any more recent CT scan may be helpful. The gallbladder is markedly distended and gallbladder folds are noted.  Cystic duct obstruction is possible although no definite wall thickening or gallstones.  The pancreatic head demonstrates diffuse low attenuation without discrete mass.  The pancreatic body and tail are quite atrophied.  There is ill-defined soft tissue density surrounding the right side of the celiac access which is likely tumor.  Scattered small lymph nodes are noted also.  The spleen is normal in size.  No focal lesions.  The adrenal glands and kidneys are unremarkable.  The stomach is grossly normal.  The duodenum and small bowel are unremarkable.  The colon demonstrates severe and diffuse inflammation most consistent with C difficile colitis.  The surrounding inflammation and fluid.  The bladder has a Foley catheter in it.  Mild wall thickening. There is moderate free pelvic fluid.  No pelvic mass or adenopathy.  Very heterogeneous appearance of the bone marrow.  Cannot exclude metastatic disease.  Review of the MIP images confirms the above findings.  IMPRESSION:  1.  Large area of  low attenuation in the liver could be treated disease or severe focal fat.  Vessels and the biliary tree run through this area so I do not think it is mass.  MRI may be helpful for further evaluation. 2.  Marked distention of the gallbladder. 3.  Ill-defined low attenuation of the pancreatic head with adjacent soft tissue tumor and small lymph nodes. 4.  Severe diffuse inflammation of the colon most consistent with C difficile colitis. 5.  Moderate amount of free pelvic fluid. 6.  Biliary stent in place.  Original Report Authenticated By: P. MARK GALLERANI, M.D.   Medications: Scheduled Meds:   . donepezil  10 mg Oral QHS  . guaiFENesin  400 mg Oral Daily  . neomycin-polymyxin-hydrocortisone  3 drop Right Ear TID  . pantoprazole  40 mg Oral BID AC  . polyethylene glycol  17 g Oral Daily  . polyethylene glycol-electrolytes  4,000 mL Oral Once  . potassium chloride  40 mEq Oral BID  . predniSONE  5 mg Oral Daily  . sodium chloride  250 mL Intravenous Once   Continuous Infusions:   . sodium chloride 75 mL/hr at 03/24/11 1208  . 0.9 % NaCl with KCl 20 mEq / L 75 mL/hr at 03/23/11 1030   PRN Meds:.acetaminophen, acetaminophen, albuterol, ALPRAZolam, alum & mag hydroxide-simeth, ondansetron (ZOFRAN) IV, ondansetron, simethicone  Assessment/Plan: Patient Active Hospital Problem List:  GI bleeding (03/22/2011) Her H&H appears stable around 9.  No moe bloody bowel movements. , her last   INR is 2.09.one unit of FFP given yesterday. She is going for colonoscopy this afternoon.    Pancreatic adenocarcinoma: (03/05/2010): CT abd shows recurrent pancreatic cancer with some pulmonary nodules consistent with metastatic disease, recomment outpatient follow up with [facility] with repeat scans. Ca 19 ordered.  status post biliary stent placement. on 02/17/2000. Status post  single-agent gemcitabine from 03/24/2010 through 06/01/2010. She  is currently undergoing surveillance, receiving periodic scans at  [facility].  She will be following up at [facility] later this month with scans   DVT of upper extremity (deep vein thrombosis) : anticoagulated with Coumadin. A Doppler of upper extremity was negative. The CT scans continue to demonstrate no progression of  disease. We will discontinue anticoagulation   Anemia of chronic disease:. The patient has received Aranesp in the  past. As her hemoglobin dropped to less than 10 , she might require Aranesp injections at her oncology office as outpatient when she gets discharged. She also has a h/o MM.  Shortness of breath: resolved.cardian enzymes are negative. CT chest was negative for PE. 2D echocardiogram is done does not show any regional wall abnormalities.  Hypokalemia:  repleted.  Mild Thrombocytopenia: stable continue to monitor.  Elevated Liver Function tests: Most likely secondary to hepatic lesions from the pancreatic adenocarcinoma. Will need further imaging to define the liver lesions, possibly MRI. Will leave it to GI to see if she needs further testing in hospital vs outpatient evaluation.      LOS: 2 days   Bohdan Macho 03/24/2011, 1:40 PM   

## 2011-03-24 NOTE — Interval H&P Note (Signed)
History and Physical Interval Note:  03/24/2011 2:22 PM  Heather Dorsey  has presented today for surgery, with the diagnosis of Hematochezia  The various methods of treatment have been discussed with the patient and family. After consideration of risks, benefits and other options for treatment, the patient has consented to  Procedure(s): COLONOSCOPY as a surgical intervention .  The patients' history has been reviewed, patient examined, no change in status, stable for surgery.  I have reviewed the patients' chart and labs.  Questions were answered to the patient's satisfaction.     Mahonri Seiden D

## 2011-03-25 ENCOUNTER — Encounter (HOSPITAL_COMMUNITY): Payer: Self-pay

## 2011-03-25 ENCOUNTER — Encounter (HOSPITAL_COMMUNITY): Payer: Self-pay | Admitting: Gastroenterology

## 2011-03-25 LAB — CBC
HCT: 25.8 % — ABNORMAL LOW (ref 36.0–46.0)
Hemoglobin: 8.9 g/dL — ABNORMAL LOW (ref 12.0–15.0)
MCV: 87.5 fL (ref 78.0–100.0)
RBC: 2.95 MIL/uL — ABNORMAL LOW (ref 3.87–5.11)
RDW: 18.3 % — ABNORMAL HIGH (ref 11.5–15.5)
WBC: 4.7 10*3/uL (ref 4.0–10.5)

## 2011-03-25 LAB — BASIC METABOLIC PANEL
BUN: 10 mg/dL (ref 6–23)
CO2: 20 mEq/L (ref 19–32)
Chloride: 111 mEq/L (ref 96–112)
Creatinine, Ser: 0.64 mg/dL (ref 0.50–1.10)
Glucose, Bld: 76 mg/dL (ref 70–99)

## 2011-03-25 MED ORDER — PROPOFOL 10 MG/ML IV EMUL
INTRAVENOUS | Status: AC
  Filled 2011-03-25: qty 50

## 2011-03-25 NOTE — Progress Notes (Signed)
Physical Therapy Evaluation Patient Details Name: Heather Dorsey MRN: [id number] DOB: Aug 11, 1923 Today's Date: 03/25/2011 1445-1519 Ev2  Problem List:  Patient Active Problem List  Diagnoses  . MULTIPLE  MYELOMA  . ARTERIOSCLEROTIC DEMENTIA  . ANXIETY  . ALLERGIC RHINITIS  . BRONCHITIS, RECURRENT  . PULMONARY FIBROSIS  . SERONEGATIVE RHEUMATOID ARTHRITIS  . DEGENERATIVE JOINT DISEASE  . BACK PAIN, LUMBAR  . OSTEOPENIA  . PARESTHESIA  . ADENOCARCINOMA, PANCREAS  . Facial swelling  . UTI (lower urinary tract infection)  . Cataracts, bilateral  . DVT of upper extremity (deep vein thrombosis)  . GI bleeding    Past Medical History:  Past Medical History  Diagnosis Date  . Allergic rhinitis   . Bronchitis, mucopurulent recurrent   . Pulmonary fibrosis   . DJD (degenerative joint disease)   . Lumbar back pain   . Seronegative rheumatoid arthritis   . Osteopenia   . Arteriosclerotic dementia   . Paresthesia   . Multiple myeloma   . Anxiety   . Adenocarcinoma of pancreas   . PONV (postoperative nausea and vomiting)    Past Surgical History:  Past Surgical History  Procedure Date  . Appendectomy   . Abdominal hysterectomy   . Common dile duct stent 02/2010    for pancreatic cancer  . Colonoscopy 03/24/2011    Procedure: COLONOSCOPY;  Surgeon: Theda Belfast, MD;  Location: [facility];  Service: Endoscopy;  Laterality: N/A;    PT Assessment/Plan/Recommendation PT Assessment Clinical Impression Statement: Patient with GIB and pancreatic cancer presents with generalized weakness, decreased independence with mobility and will benefit from skilled PT in acute setting to maximize independence and safety with mobility to return home with daughter assist. PT Recommendation/Assessment: Patient will need skilled PT in the acute care venue PT Problem List: Decreased strength;Decreased activity tolerance;Decreased mobility;Decreased knowledge of use of DME PT Therapy  Diagnosis : Abnormality of gait;Generalized weakness PT Plan PT Frequency: Min 3X/week PT Treatment/Interventions: Gait training;DME instruction;Stair training;Functional mobility training;Therapeutic activities;Therapeutic exercise;Patient/family education PT Recommendation Follow Up Recommendations: Home health PT Equipment Recommended: None recommended by PT PT Goals  Acute Rehab PT Goals PT Goal Formulation: With patient/family Time For Goal Achievement: 7 days Pt will go Supine/Side to Sit: with supervision PT Goal: Supine/Side to Sit - Progress: Not met Pt will go Sit to Supine/Side: with supervision PT Goal: Sit to Supine/Side - Progress: Not met Pt will go Sit to Stand: with min assist PT Goal: Sit to Stand - Progress: Not met Pt will go Stand to Sit: with supervision PT Goal: Stand to Sit - Progress: Not met Pt will Ambulate: 51 - 150 feet;with supervision PT Goal: Ambulate - Progress: Not met Pt will Go Up / Down Stairs: 3-5 stairs;with rail(s);with min assist PT Goal: Up/Down Stairs - Progress: Not met  PT Evaluation Precautions/Restrictions  Precautions Precautions: Fall Prior Functioning  Home Living Lives With: Alone (daughter staying with patient currently) Receives Help From: Family Type of Home: House Home Layout: One level Home Access: Stairs to enter Entrance Stairs-Rails: Lawyer of Steps: 4 Home Adaptive Equipment: Straight cane;Walker - rolling Prior Function Level of Independence: Independent with basic ADLs;Independent with gait;Independent with transfers;Requires assistive device for independence (used cane occasionally) Cognition Cognition Arousal/Alertness: Awake/alert Overall Cognitive Status: Impaired Orientation Level: Disoriented to time;Disoriented to place;Disoriented to situation;Oriented to person Cognition - Other Comments: stated she was in [place], but when asked what hospital she stated [facility] Sensation/Coordination   Extremity Assessment RLE Assessment RLE  Assessment: Not tested LLE Assessment LLE Assessment: Not tested Mobility (including Balance) Bed Mobility Bed Mobility: Yes Supine to Sit: 1: +2 Total assist Supine to Sit Details (indicate cue type and reason): pt=50% able to move feet over to edge of bed, then assist to move around foot board and +2 to right her trunk Sitting - Scoot to Edge of Bed: 3: Mod assist Sitting - Scoot to Edge of Bed Details (indicate cue type and reason): scooting on pad under patient Transfers Transfers: Yes Sit to Stand: 1: +2 Total assist;From bed;With upper extremity assist Sit to Stand Details (indicate cue type and reason): pt=40% Stand to Sit: To chair/3-in-1;With armrests;3: Mod assist Stand to Sit Details: for safety and positioning, controlled lowering Squat Pivot Transfers: 3: Mod assist;With armrests;With upper extremity assistance Squat Pivot Transfer Details (indicate cue type and reason): chair<>3:1 Ambulation/Gait Ambulation/Gait: Yes Ambulation/Gait Assistance: 4: Min assist Ambulation/Gait Assistance Details (indicate cue type and reason): +2 for chair and IV, cues for tucking hips and righting posture Ambulation Distance (Feet): 70 Feet Assistive device: Rolling walker Gait Pattern: Trunk flexed Gait velocity: slow speed  Posture/Postural Control Posture/Postural Control: Postural limitations Postural Limitations: patient rights shoulders when cued for erect posture, but keeps hips flexed.  Rights with manual assist briefly.  Weak trunk musculature evident Static Sitting Balance Static Sitting - Balance Support: No upper extremity supported;Feet unsupported Static Sitting - Level of Assistance: 3: Mod assist Static Sitting - Comment/# of Minutes: falls posterior when tech attempting to don socks for walking Exercise    End of Session PT - End of Session Equipment Utilized During Treatment: Gait  belt Activity Tolerance: Patient tolerated treatment well Patient left: in chair;with family/visitor present General Behavior During Session: [facility] for tasks performed Cognition: Impaired  WYNN,CYNDI 03/25/2011, 4:11 PM

## 2011-03-25 NOTE — Progress Notes (Signed)
Heather Dorsey MOVED TO 1427 BY [facility] WITH RN- WILL BE FOR TELE.

## 2011-03-25 NOTE — Progress Notes (Signed)
Patient ID: Heather Dorsey, female   DOB: 15-Mar-1923, 78 y.o.   MRN: [id number] Subjective: No acute events overnight.  No further bleeding.  Objective: Vital signs in last 24 hours: Temp:  [97.6 F (36.4 C)-98.8 F (37.1 C)] 98.1 F (36.7 C) (01/11 0800) Pulse Rate:  [64-82] 73  (01/11 1000) Resp:  [13-27] 15  (01/11 1000) BP: (90-171)/(49-89) 103/59 mmHg (01/11 0900) SpO2:  [98 %-100 %] 100 % (01/11 1000) Weight:  [72.6 kg (160 lb 0.9 oz)] 72.6 kg (160 lb 0.9 oz) (01/11 0100) Last BM Date: 03/25/11  Intake/Output from previous day: 01/10 0701 - 01/11 0700 In: 1140 [P.O.:240; I.V.:900] Out: 802 [Urine:800; Stool:2] Intake/Output this shift: Total I/O In: 615 [P.O.:390; I.V.:225] Out: 85 [Urine:85]  General appearance: alert and no distress GI: soft, non-tender; bowel sounds normal; no masses,  no organomegaly  Lab Results:  Basename 03/25/11 0500 03/24/11 0520 03/23/11 1320  WBC 4.7 5.7 6.2  HGB 8.9* 9.2* 9.5*  HCT 25.8* 26.4* 26.8*  PLT 113* 107* 107*   BMET  Basename 03/25/11 0500 03/24/11 0520 03/23/11 0545  NA 139 138 138  K 3.7 4.3 3.4*  CL 111 109 105  CO2 20 20 23   GLUCOSE 76 80 101*  BUN 10 11 11   CREATININE 0.64 0.65 0.64  CALCIUM 8.6 8.6 8.3*   LFT  Basename 03/24/11 0520  PROT 6.1  ALBUMIN 2.7*  AST 127*  ALT 160*  ALKPHOS 237*  BILITOT 2.7*  BILIDIR --  IBILI --   PT/INR  Basename 03/23/11 0545 03/22/11 1538  LABPROT 23.8* 46.4*  INR 2.09* 4.90*   Hepatitis Panel No results found for this basename: HEPBSAG,HCVAB,HEPAIGM,HEPBIGM in the last 72 hours C-Diff No results found for this basename: CDIFFTOX:3 in the last 72 hours Fecal Lactopherrin No results found for this basename: FECLLACTOFRN in the last 72 hours  Studies/Results: No results found.  Medications:  Scheduled:   . donepezil  10 mg Oral QHS  . guaiFENesin  400 mg Oral Daily  . neomycin-polymyxin-hydrocortisone  3 drop Right Ear TID  . pantoprazole  40 mg Oral BID  AC  . polyethylene glycol  17 g Oral Daily  . predniSONE  5 mg Oral Daily  . sodium chloride  250 mL Intravenous Once   Continuous:   . sodium chloride 75 mL/hr (03/25/11 0558)    Assessment/Plan: 1) Lower GI bleed. 2) Colonic polyps. 3) Possible recurrent pancreatic cancer with metastases.   The patient is stable from the GI standpoint.  She was identified to have colonic polyps, which could have bleed while she was on coumadin.  She could have bleed from hemorrhoids.  I do not feel that she had an upper source of bleeding at this time.  With the possibility of a recurrent pancreatic cancer with metastases and the fact that she does not need coumadin, the polyps were not removed.  Plan: 1) Follow up in [facility] for her possible recurrent pancreatic cancer. 2) Avoid coumadin. 3) I will sign off at this time.  LOS: 3 days   Ruford Dudzinski D 03/25/2011, 10:53 AM

## 2011-03-25 NOTE — Progress Notes (Signed)
Subjective: PT IS CONFUSED THIS MORNING. As per RN pt tried to pull her port cath, a sitter was put in the room for safety. She denies any bloody bowel movements. Denies any nausea, vomiting, or abd pain.   Objective: Weight change: 0.3 kg (10.6 oz)  Intake/Output Summary (Last 24 hours) at 03/25/11 1248 Last data filed at 03/25/11 1100  Gross per 24 hour  Intake   1830 ml  Output    735 ml  Net   1095 ml    Filed Vitals:   03/25/11 1200  BP:   Pulse:   Temp: 97.6 F (36.4 C)  Resp:    On Exam  She is alert afebrile and comfortable  CVS: S1 S2 heard  LUngs: clear  Abdomen: soft non tender non distended bowel sounds heard.  Extremities: no pedal edema, cyanosis or clubbing   Lab Results: Results for orders placed during the hospital encounter of 03/22/11 (from the past 24 hour(s))  CBC     Status: Abnormal   Collection Time   03/25/11  5:00 AM      Component Value Range   WBC 4.7  4.0 - 10.5 (K/uL)   RBC 2.95 (*) 3.87 - 5.11 (MIL/uL)   Hemoglobin 8.9 (*) 12.0 - 15.0 (g/dL)   HCT 16.1 (*) 09.6 - 46.0 (%)   MCV 87.5  78.0 - 100.0 (fL)   MCH 30.2  26.0 - 34.0 (pg)   MCHC 34.5  30.0 - 36.0 (g/dL)   RDW 04.5 (*) 40.9 - 15.5 (%)   Platelets 113 (*) 150 - 400 (K/uL)  BASIC METABOLIC PANEL     Status: Abnormal   Collection Time   03/25/11  5:00 AM      Component Value Range   Sodium 139  135 - 145 (mEq/L)   Potassium 3.7  3.5 - 5.1 (mEq/L)   Chloride 111  96 - 112 (mEq/L)   CO2 20  19 - 32 (mEq/L)   Glucose, Bld 76  70 - 99 (mg/dL)   BUN 10  6 - 23 (mg/dL)   Creatinine, Ser 8.11  0.50 - 1.10 (mg/dL)   Calcium 8.6  8.4 - 91.4 (mg/dL)   GFR calc non Af Amer 79 (*) >90 (mL/min)   GFR calc Af Amer >90  >90 (mL/min)     Micro Results: Recent Results (from the past 240 hour(s))  MRSA PCR SCREENING     Status: Normal   Collection Time   03/22/11  9:56 PM      Component Value Range Status Comment   MRSA by PCR NEGATIVE  NEGATIVE  Final   CLOSTRIDIUM DIFFICILE BY PCR      Status: Normal   Collection Time   03/23/11  8:47 AM      Component Value Range Status Comment   C difficile by pcr NEGATIVE  NEGATIVE  Final     Studies/Results: Dg Chest 1 View  03/22/2011  *RADIOLOGY REPORT*  Clinical Data: Short of breath.  Pulmonary fibrosis.  Pancreatic cancer.  CHEST - 1 VIEW  Comparison: 11/04/2010.  Findings: Patient is rotated to the right.  Right IJ Port-A- Cath/power port is unchanged.  Low lung volumes with basilar atelectasis.  No airspace disease.  No effusion.  Cardiopericardial silhouette is probably unchanged allowing for low inspiratory volumes.  IMPRESSION: Low volume chest.  Stable support apparatus.  No gross consolidation.  Original Report Authenticated By: Andreas Newport, M.D.   Ct Angio Chest W/cm &/or Wo Cm  03/22/2011  *  RADIOLOGY REPORT*  Clinical Data:  Shortness of breath and rectal bleeding.  CT ANGIOGRAPHY CHEST CT ABDOMEN AND PELVIS WITH CONTRAST  Technique:  Multidetector CT imaging of the chest was performed using the standard protocol during bolus administration of intravenous contrast.  Multiplanar CT image reconstructions including MIPs were obtained to evaluate the vascular anatomy. Multidetector CT imaging of the abdomen and pelvis was performed using the standard protocol during bolus administration of intravenous contrast.  Contrast: OMNIPAQUE IOHEXOL 300 MG/ML IV SOLN  Comparison:  CT chest 02/10/2010.  CTA CHEST  Findings:  The chest wall is unremarkable.  No breast masses, supraclavicular or axillary lymphadenopathy.  A right-sided Port-A- Cath is noted.  The bony thorax is intact.  Lytic and sclerotic appearing bone changes suspicious for metastatic disease.  The heart is normal in size.  No pericardial effusion.  There are scattered mediastinal and hilar lymph nodes but no adenopathy.  The esophagus is grossly normal.  The aorta is tortuous and mildly ectatic with atherosclerotic changes but no dissection or focal aneurysm.  Dense coronary  artery calcifications are noted.  The pulmonary arterial tree is fairly well opacified.  No filling defects to suggest pulmonary emboli.  There is noted in the left brachiocephalic vein likely from the injection.  Examination of the lung parenchyma demonstrates pulmonary scarring and peribronchial thickening.  Vague nodular lung lesions.  Cannot exclude metastatic disease.  No edema or effusions.   Review of the MIP images confirms the above findings.  IMPRESSION:  1.  No CT findings for pulmonary embolism. 2.  No mediastinal or hilar adenopathy. 3.  Scattered ill-defined pulmonary lesions.  Pulmonary metastatic disease is possible.  Close CT follow-up is recommended.  CT ABDOMEN AND PELVIS  Findings: A biliary stent is in place.  There is associated intrahepatic biliary air.  There is a large low attenuation area in the medial segment of the left hepatic lobe and also in part the right hepatic lobe.   This was not present on the prior examination.  It could be a treated lesion.  Vessels and the biliary tree run through this area and severe focal fat is possible.  Comparison with any more recent CT scan may be helpful. The gallbladder is markedly distended and gallbladder folds are noted.  Cystic duct obstruction is possible although no definite wall thickening or gallstones.  The pancreatic head demonstrates diffuse low attenuation without discrete mass.  The pancreatic body and tail are quite atrophied.  There is ill-defined soft tissue density surrounding the right side of the celiac access which is likely tumor.  Scattered small lymph nodes are noted also.  The spleen is normal in size.  No focal lesions.  The adrenal glands and kidneys are unremarkable.  The stomach is grossly normal.  The duodenum and small bowel are unremarkable.  The colon demonstrates severe and diffuse inflammation most consistent with C difficile colitis.  The surrounding inflammation and fluid.  The bladder has a Foley catheter in it.   Mild wall thickening. There is moderate free pelvic fluid.  No pelvic mass or adenopathy.  Very heterogeneous appearance of the bone marrow.  Cannot exclude metastatic disease.  Review of the MIP images confirms the above findings.  IMPRESSION:  1.  Large area of low attenuation in the liver could be treated disease or severe focal fat.  Vessels and the biliary tree run through this area so I do not think it is mass.  MRI may be helpful for further  evaluation. 2.  Marked distention of the gallbladder. 3.  Ill-defined low attenuation of the pancreatic head with adjacent soft tissue tumor and small lymph nodes. 4.  Severe diffuse inflammation of the colon most consistent with C difficile colitis. 5.  Moderate amount of free pelvic fluid. 6.  Biliary stent in place.  Original Report Authenticated By: P. Loralie Champagne, M.D.   US Abdomen Complete  03/22/2011  *RADIOLOGY REPORT*  Clinical Data:  Abdominal pain, new transaminitis.  History of pancreatic cancer status post biliary stent in 2011  COMPLETE ABDOMINAL ULTRASOUND  Comparison:  None.  Findings:  Gallbladder:  Gallbladder sludge.  No gallbladder wall thickening, pericholecystic fluid, or sonographic Murphy's sign.  Common bile duct:  Measures 10 mm.  Liver:  No focal lesion identified.  Pneumobilia, likely secondary to indwelling biliary stent.  IVC:  Appears normal.  Pancreas:  Poorly visualized.  Pancreatic duct stent in place.  Spleen:  Measures 8.1 cm.  Right Kidney:  Measures 9.6 cm.  No mass or hydronephrosis.  Left Kidney:  Measures 9.6 cm.  No mass or hydronephrosis.  Abdominal aorta:  No aneurysm identified.  Additional comments:  Ascites.  IMPRESSION: Pancreatic duct stent in place.  Secondary pneumobilia.  Gallbladder sludge, without associated findings to suggest acute cholecystitis.  Ascites.  Original Report Authenticated By: Charline Bills, M.D.   Ct Abdomen Pelvis W Contrast  03/22/2011  *RADIOLOGY REPORT*  Clinical Data:  Shortness of  breath and rectal bleeding.  CT ANGIOGRAPHY CHEST CT ABDOMEN AND PELVIS WITH CONTRAST  Technique:  Multidetector CT imaging of the chest was performed using the standard protocol during bolus administration of intravenous contrast.  Multiplanar CT image reconstructions including MIPs were obtained to evaluate the vascular anatomy. Multidetector CT imaging of the abdomen and pelvis was performed using the standard protocol during bolus administration of intravenous contrast.  Contrast: OMNIPAQUE IOHEXOL 300 MG/ML IV SOLN  Comparison:  CT chest 02/10/2010.  CTA CHEST  Findings:  The chest wall is unremarkable.  No breast masses, supraclavicular or axillary lymphadenopathy.  A right-sided Port-A- Cath is noted.  The bony thorax is intact.  Lytic and sclerotic appearing bone changes suspicious for metastatic disease.  The heart is normal in size.  No pericardial effusion.  There are scattered mediastinal and hilar lymph nodes but no adenopathy.  The esophagus is grossly normal.  The aorta is tortuous and mildly ectatic with atherosclerotic changes but no dissection or focal aneurysm.  Dense coronary artery calcifications are noted.  The pulmonary arterial tree is fairly well opacified.  No filling defects to suggest pulmonary emboli.  There is noted in the left brachiocephalic vein likely from the injection.  Examination of the lung parenchyma demonstrates pulmonary scarring and peribronchial thickening.  Vague nodular lung lesions.  Cannot exclude metastatic disease.  No edema or effusions.   Review of the MIP images confirms the above findings.  IMPRESSION:  1.  No CT findings for pulmonary embolism. 2.  No mediastinal or hilar adenopathy. 3.  Scattered ill-defined pulmonary lesions.  Pulmonary metastatic disease is possible.  Close CT follow-up is recommended.  CT ABDOMEN AND PELVIS  Findings: A biliary stent is in place.  There is associated intrahepatic biliary air.  There is a large low attenuation area in  the medial segment of the left hepatic lobe and also in part the right hepatic lobe.   This was not present on the prior examination.  It could be a treated lesion.  Vessels and the biliary  tree run through this area and severe focal fat is possible.  Comparison with any more recent CT scan may be helpful. The gallbladder is markedly distended and gallbladder folds are noted.  Cystic duct obstruction is possible although no definite wall thickening or gallstones.  The pancreatic head demonstrates diffuse low attenuation without discrete mass.  The pancreatic body and tail are quite atrophied.  There is ill-defined soft tissue density surrounding the right side of the celiac access which is likely tumor.  Scattered small lymph nodes are noted also.  The spleen is normal in size.  No focal lesions.  The adrenal glands and kidneys are unremarkable.  The stomach is grossly normal.  The duodenum and small bowel are unremarkable.  The colon demonstrates severe and diffuse inflammation most consistent with C difficile colitis.  The surrounding inflammation and fluid.  The bladder has a Foley catheter in it.  Mild wall thickening. There is moderate free pelvic fluid.  No pelvic mass or adenopathy.  Very heterogeneous appearance of the bone marrow.  Cannot exclude metastatic disease.  Review of the MIP images confirms the above findings.  IMPRESSION:  1.  Large area of low attenuation in the liver could be treated disease or severe focal fat.  Vessels and the biliary tree run through this area so I do not think it is mass.  MRI may be helpful for further evaluation. 2.  Marked distention of the gallbladder. 3.  Ill-defined low attenuation of the pancreatic head with adjacent soft tissue tumor and small lymph nodes. 4.  Severe diffuse inflammation of the colon most consistent with C difficile colitis. 5.  Moderate amount of free pelvic fluid. 6.  Biliary stent in place.  Original Report Authenticated By: P. Loralie Champagne,  M.D.   Medications: Scheduled Meds:   . donepezil  10 mg Oral QHS  . guaiFENesin  400 mg Oral Daily  . neomycin-polymyxin-hydrocortisone  3 drop Right Ear TID  . pantoprazole  40 mg Oral BID AC  . polyethylene glycol  17 g Oral Daily  . predniSONE  5 mg Oral Daily  . sodium chloride  250 mL Intravenous Once   Continuous Infusions:   . sodium chloride 75 mL/hr (03/25/11 0558)   PRN Meds:.acetaminophen, acetaminophen, albuterol, ALPRAZolam, alum & mag hydroxide-simeth, ondansetron (ZOFRAN) IV, ondansetron, simethicone, DISCONTD: fentaNYL, DISCONTD: midazolam  Assessment/Plan:  LOS: 3 days  GI bleeding (03/22/2011) Her H&H appears stable around 9. No more bloody bowel movements, she underwent a colonoscopy yesterday showed multiple polyps and hemorroids. She is cleared from GI perspective to be discharged and further follow up as outpatient. SHE WILL BE OFF ANTICOAGULATION.  Will transfer patient out of SDU.   Pancreatic adenocarcinoma: (03/05/2010): CT abd shows recurrent pancreatic cancer with some pulmonary nodules consistent with metastatic disease, recomment outpatient follow up with [facility] with repeat scans. Ca 19 LOW.  status post biliary stent placement. on 02/17/2000. Status post  single-agent gemcitabine from 03/24/2010 through 06/01/2010. She  is currently undergoing surveillance, receiving periodic scans at  [facility]. She will be following up at [facility] later this month with scans   DVT of upper extremity (deep vein thrombosis) : anticoagulated with Coumadin. A Doppler of upper extremity was negative. The CT scans continue to demonstrate no progression of  disease. We will discontinue anticoagulation   Anemia of chronic disease:. The patient has received Aranesp in the  past. As her hemoglobin dropped to less than 10 , she might require Aranesp injections at her oncology office  as outpatient when she gets discharged. She also has a h/o MM.  Shortness of breath: resolved.cardian enzymes  are negative. CT chest was negative for PE. 2D echocardiogram is done does not show any regional wall abnormalities.  Hypokalemia: repleted.  Mild Thrombocytopenia: stable continue to monitor.  Elevated Liver Function tests: Most likely secondary to hepatic lesions from the pancreatic adenocarcinoma. Will need further imaging to define the liver lesions, possibly MRI. Will leave it to GI to see if she needs further testing in hospital vs outpatient evaluation.   Heather Dorsey 03/25/2011, 12:48 PM

## 2011-03-26 LAB — CBC
HCT: 26.7 % — ABNORMAL LOW (ref 36.0–46.0)
MCH: 30.4 pg (ref 26.0–34.0)
MCHC: 34.8 g/dL (ref 30.0–36.0)
MCV: 87.3 fL (ref 78.0–100.0)
RDW: 17.7 % — ABNORMAL HIGH (ref 11.5–15.5)

## 2011-03-26 MED ORDER — SODIUM CHLORIDE 0.9 % IJ SOLN
10.0000 mL | INTRAMUSCULAR | Status: DC | PRN
  Administered 2011-03-26 – 2011-03-28 (×3): 10 mL

## 2011-03-26 MED ORDER — POLYETHYLENE GLYCOL 3350 17 GM/SCOOP PO POWD: 17.0000 g | Freq: Every day | ORAL | Status: AC | PRN

## 2011-03-26 MED ORDER — ONDANSETRON HCL 4 MG PO TABS: 4.0000 mg | ORAL_TABLET | Freq: Four times a day (QID) | ORAL | Status: DC | PRN

## 2011-03-26 NOTE — Progress Notes (Signed)
Spoke with pt without dgt being present. We discussed [facility] Re[facility]. Pt is receptive to this plan of care. Notified MD changes to plan of care noted. Pt dgt receptive to plan of care. Wants to talk to SW and have oncologist consult as well

## 2011-03-26 NOTE — Progress Notes (Signed)
Subjective: Early today she stated she feels better to go home. At the time of discharge she stated she  Does n't feel safe to go home and wants to consider going to rehab for a few days. She also stated that she is too exhausted to go home.   Objective: Weight change:   Intake/Output Summary (Last 24 hours) at 03/26/11 1825 Last data filed at 03/26/11 1500  Gross per 24 hour  Intake 1687.5 ml  Output    600 ml  Net 1087.5 ml    Filed Vitals:   03/26/11 1632  BP: 103/71  Pulse:   Temp:   Resp:    On Exam  She is alert afebrile and comfortable and oriented.  CVS: S1 S2 heard  LUngs: clear  Abdomen: soft non tender non distended bowel sounds heard.  Extremities: no pedal edema, cyanosis or clubbing   Lab Results: Results for orders placed during the hospital encounter of 03/22/11 (from the past 24 hour(s))  CBC     Status: Abnormal   Collection Time   03/26/11  4:15 AM      Component Value Range   WBC 4.3  4.0 - 10.5 (K/uL)   RBC 3.06 (*) 3.87 - 5.11 (MIL/uL)   Hemoglobin 9.3 (*) 12.0 - 15.0 (g/dL)   HCT 16.1 (*) 09.6 - 46.0 (%)   MCV 87.3  78.0 - 100.0 (fL)   MCH 30.4  26.0 - 34.0 (pg)   MCHC 34.8  30.0 - 36.0 (g/dL)   RDW 04.5 (*) 40.9 - 15.5 (%)   Platelets 128 (*) 150 - 400 (K/uL)     Micro Results: Recent Results (from the past 240 hour(s))  MRSA PCR SCREENING     Status: Normal   Collection Time   03/22/11  9:56 PM      Component Value Range Status Comment   MRSA by PCR NEGATIVE  NEGATIVE  Final   CLOSTRIDIUM DIFFICILE BY PCR     Status: Normal   Collection Time   03/23/11  8:47 AM      Component Value Range Status Comment   C difficile by pcr NEGATIVE  NEGATIVE  Final     Studies/Results: Dg Chest 1 View  03/22/2011  *RADIOLOGY REPORT*  Clinical Data: Short of breath.  Pulmonary fibrosis.  Pancreatic cancer.  CHEST - 1 VIEW  Comparison: 11/04/2010.  Findings: Patient is rotated to the right.  Right IJ Port-A- Cath/power port is unchanged.  Low lung  volumes with basilar atelectasis.  No airspace disease.  No effusion.  Cardiopericardial silhouette is probably unchanged allowing for low inspiratory volumes.  IMPRESSION: Low volume chest.  Stable support apparatus.  No gross consolidation.  Original Report Authenticated By: Andreas Newport, M.D.   Ct Angio Chest W/cm &/or Wo Cm  03/22/2011  *RADIOLOGY REPORT*  Clinical Data:  Shortness of breath and rectal bleeding.  CT ANGIOGRAPHY CHEST CT ABDOMEN AND PELVIS WITH CONTRAST  Technique:  Multidetector CT imaging of the chest was performed using the standard protocol during bolus administration of intravenous contrast.  Multiplanar CT image reconstructions including MIPs were obtained to evaluate the vascular anatomy. Multidetector CT imaging of the abdomen and pelvis was performed using the standard protocol during bolus administration of intravenous contrast.  Contrast: OMNIPAQUE IOHEXOL 300 MG/ML IV SOLN  Comparison:  CT chest 02/10/2010.  CTA CHEST  Findings:  The chest wall is unremarkable.  No breast masses, supraclavicular or axillary lymphadenopathy.  A right-sided Port-A- Cath is noted.  The bony thorax is intact.  Lytic and sclerotic appearing bone changes suspicious for metastatic disease.  The heart is normal in size.  No pericardial effusion.  There are scattered mediastinal and hilar lymph nodes but no adenopathy.  The esophagus is grossly normal.  The aorta is tortuous and mildly ectatic with atherosclerotic changes but no dissection or focal aneurysm.  Dense coronary artery calcifications are noted.  The pulmonary arterial tree is fairly well opacified.  No filling defects to suggest pulmonary emboli.  There is noted in the left brachiocephalic vein likely from the injection.  Examination of the lung parenchyma demonstrates pulmonary scarring and peribronchial thickening.  Vague nodular lung lesions.  Cannot exclude metastatic disease.  No edema or effusions.   Review of the MIP images  confirms the above findings.  IMPRESSION:  1.  No CT findings for pulmonary embolism. 2.  No mediastinal or hilar adenopathy. 3.  Scattered ill-defined pulmonary lesions.  Pulmonary metastatic disease is possible.  Close CT follow-up is recommended.  CT ABDOMEN AND PELVIS  Findings: A biliary stent is in place.  There is associated intrahepatic biliary air.  There is a large low attenuation area in the medial segment of the left hepatic lobe and also in part the right hepatic lobe.   This was not present on the prior examination.  It could be a treated lesion.  Vessels and the biliary tree run through this area and severe focal fat is possible.  Comparison with any more recent CT scan may be helpful. The gallbladder is markedly distended and gallbladder folds are noted.  Cystic duct obstruction is possible although no definite wall thickening or gallstones.  The pancreatic head demonstrates diffuse low attenuation without discrete mass.  The pancreatic body and tail are quite atrophied.  There is ill-defined soft tissue density surrounding the right side of the celiac access which is likely tumor.  Scattered small lymph nodes are noted also.  The spleen is normal in size.  No focal lesions.  The adrenal glands and kidneys are unremarkable.  The stomach is grossly normal.  The duodenum and small bowel are unremarkable.  The colon demonstrates severe and diffuse inflammation most consistent with C difficile colitis.  The surrounding inflammation and fluid.  The bladder has a Foley catheter in it.  Mild wall thickening. There is moderate free pelvic fluid.  No pelvic mass or adenopathy.  Very heterogeneous appearance of the bone marrow.  Cannot exclude metastatic disease.  Review of the MIP images confirms the above findings.  IMPRESSION:  1.  Large area of low attenuation in the liver could be treated disease or severe focal fat.  Vessels and the biliary tree run through this area so I do not think it is mass.  MRI  may be helpful for further evaluation. 2.  Marked distention of the gallbladder. 3.  Ill-defined low attenuation of the pancreatic head with adjacent soft tissue tumor and small lymph nodes. 4.  Severe diffuse inflammation of the colon most consistent with C difficile colitis. 5.  Moderate amount of free pelvic fluid. 6.  Biliary stent in place.  Original Report Authenticated By: P. Loralie Champagne, M.D.   US Abdomen Complete  03/22/2011  *RADIOLOGY REPORT*  Clinical Data:  Abdominal pain, new transaminitis.  History of pancreatic cancer status post biliary stent in 2011  COMPLETE ABDOMINAL ULTRASOUND  Comparison:  None.  Findings:  Gallbladder:  Gallbladder sludge.  No gallbladder wall thickening, pericholecystic fluid, or sonographic Murphy's sign.  Common bile duct:  Measures 10 mm.  Liver:  No focal lesion identified.  Pneumobilia, likely secondary to indwelling biliary stent.  IVC:  Appears normal.  Pancreas:  Poorly visualized.  Pancreatic duct stent in place.  Spleen:  Measures 8.1 cm.  Right Kidney:  Measures 9.6 cm.  No mass or hydronephrosis.  Left Kidney:  Measures 9.6 cm.  No mass or hydronephrosis.  Abdominal aorta:  No aneurysm identified.  Additional comments:  Ascites.  IMPRESSION: Pancreatic duct stent in place.  Secondary pneumobilia.  Gallbladder sludge, without associated findings to suggest acute cholecystitis.  Ascites.  Original Report Authenticated By: Charline Bills, M.D.   Ct Abdomen Pelvis W Contrast  03/22/2011  *RADIOLOGY REPORT*  Clinical Data:  Shortness of breath and rectal bleeding.  CT ANGIOGRAPHY CHEST CT ABDOMEN AND PELVIS WITH CONTRAST  Technique:  Multidetector CT imaging of the chest was performed using the standard protocol during bolus administration of intravenous contrast.  Multiplanar CT image reconstructions including MIPs were obtained to evaluate the vascular anatomy. Multidetector CT imaging of the abdomen and pelvis was performed using the standard protocol  during bolus administration of intravenous contrast.  Contrast: OMNIPAQUE IOHEXOL 300 MG/ML IV SOLN  Comparison:  CT chest 02/10/2010.  CTA CHEST  Findings:  The chest wall is unremarkable.  No breast masses, supraclavicular or axillary lymphadenopathy.  A right-sided Port-A- Cath is noted.  The bony thorax is intact.  Lytic and sclerotic appearing bone changes suspicious for metastatic disease.  The heart is normal in size.  No pericardial effusion.  There are scattered mediastinal and hilar lymph nodes but no adenopathy.  The esophagus is grossly normal.  The aorta is tortuous and mildly ectatic with atherosclerotic changes but no dissection or focal aneurysm.  Dense coronary artery calcifications are noted.  The pulmonary arterial tree is fairly well opacified.  No filling defects to suggest pulmonary emboli.  There is noted in the left brachiocephalic vein likely from the injection.  Examination of the lung parenchyma demonstrates pulmonary scarring and peribronchial thickening.  Vague nodular lung lesions.  Cannot exclude metastatic disease.  No edema or effusions.   Review of the MIP images confirms the above findings.  IMPRESSION:  1.  No CT findings for pulmonary embolism. 2.  No mediastinal or hilar adenopathy. 3.  Scattered ill-defined pulmonary lesions.  Pulmonary metastatic disease is possible.  Close CT follow-up is recommended.  CT ABDOMEN AND PELVIS  Findings: A biliary stent is in place.  There is associated intrahepatic biliary air.  There is a large low attenuation area in the medial segment of the left hepatic lobe and also in part the right hepatic lobe.   This was not present on the prior examination.  It could be a treated lesion.  Vessels and the biliary tree run through this area and severe focal fat is possible.  Comparison with any more recent CT scan may be helpful. The gallbladder is markedly distended and gallbladder folds are noted.  Cystic duct obstruction is possible although no  definite wall thickening or gallstones.  The pancreatic head demonstrates diffuse low attenuation without discrete mass.  The pancreatic body and tail are quite atrophied.  There is ill-defined soft tissue density surrounding the right side of the celiac access which is likely tumor.  Scattered small lymph nodes are noted also.  The spleen is normal in size.  No focal lesions.  The adrenal glands and kidneys are unremarkable.  The stomach is grossly normal.  The  duodenum and small bowel are unremarkable.  The colon demonstrates severe and diffuse inflammation most consistent with C difficile colitis.  The surrounding inflammation and fluid.  The bladder has a Foley catheter in it.  Mild wall thickening. There is moderate free pelvic fluid.  No pelvic mass or adenopathy.  Very heterogeneous appearance of the bone marrow.  Cannot exclude metastatic disease.  Review of the MIP images confirms the above findings.  IMPRESSION:  1.  Large area of low attenuation in the liver could be treated disease or severe focal fat.  Vessels and the biliary tree run through this area so I do not think it is mass.  MRI may be helpful for further evaluation. 2.  Marked distention of the gallbladder. 3.  Ill-defined low attenuation of the pancreatic head with adjacent soft tissue tumor and small lymph nodes. 4.  Severe diffuse inflammation of the colon most consistent with C difficile colitis. 5.  Moderate amount of free pelvic fluid. 6.  Biliary stent in place.  Original Report Authenticated By: P. Loralie Champagne, M.D.   Medications: Scheduled Meds:   . donepezil  10 mg Oral QHS  . guaiFENesin  400 mg Oral Daily  . neomycin-polymyxin-hydrocortisone  3 drop Right Ear TID  . pantoprazole  40 mg Oral BID AC  . polyethylene glycol  17 g Oral Daily  . predniSONE  5 mg Oral Daily  . sodium chloride  250 mL Intravenous Once   Continuous Infusions:   . DISCONTD: sodium chloride 75 mL/hr (03/25/11 1850)   PRN  Meds:.acetaminophen, acetaminophen, albuterol, ALPRAZolam, alum & mag hydroxide-simeth, ondansetron (ZOFRAN) IV, ondansetron, simethicone, sodium chloride  Assessment/Plan: Patient Active Hospital Problem List: Gi bleed: Her H&H appears stable around 9. No more bloody bowel movements, she underwent a colonoscopy  showed multiple polyps and hemorroids. She is cleared from GI perspective to be discharged and further follow up as outpatient. SHE WILL BE OFF ANTICOAGULATION.   Pancreatic adenocarcinoma: (03/05/2010): CT abd shows recurrent pancreatic cancer with some pulmonary nodules consistent with metastatic disease, recomment outpatient follow up with [facility] with repeat scans. Ca 19 LOW.  status post biliary stent placement. on 02/17/2000. Status post  single-agent gemcitabine from 03/24/2010 through 06/01/2010. She  is currently undergoing surveillance, receiving periodic scans at  [facility]. She will be following up at [facility] later this month with scans   DVT of upper extremity (deep vein thrombosis) : anticoagulated with Coumadin. A Doppler of upper extremity was negative. The CT scans continue to demonstrate no progression of  disease. We will discontinue anticoagulation   Anemia of chronic disease:. The patient has received Aranesp in the  past. As her hemoglobin dropped to less than 10 , she might require Aranesp injections at her oncology office as outpatient when she gets discharged. She also has a h/o MM.  Shortness of breath: resolved.cardian enzymes are negative. CT chest was negative for PE. 2D echocardiogram is done does not show any regional wall abnormalities.  Hypokalemia: repleted.  Mild Thrombocytopenia: stable continue to monitor.  Elevated Liver Function tests: Most likely secondary to hepatic lesions from the pancreatic adenocarcinoma. Will need further imaging to define the liver lesions, possibly MRI, which can be done as outpatient.  Diposition: initially she was planned to  discharge home with home pt. But pt states she doesn't feel safe to go home by herself and wants to consider Rehabilitation for a few weeks before going home. Will call CSW to come and assist with the disposition to rehab.  LOS: 4 days   Zeya Balles 03/26/2011, 6:25 PM

## 2011-03-26 NOTE — Progress Notes (Signed)
16:45- to presentRounding and found dgt very upset stating MD was in and was discharging patient. Dgt stating that patient need one more night. F/U with MD and from a medical standpoint patient is medically stable for d/c home with dgt.. Prior to MD rounding and d/c pt. Pt had an uneventful day. Was out of bed up in chair most of day. Required standby to one + assist when getting out of bed and when ambulating to the bathroom. Pt ambulated to the door with standby assist. Had to remind her to stand tall. Once we got to the door. Pt stated that she had to go to the bathroom. Proceeded to the bathroom without difficulty.  Pt placed in bathroom. Upon returning to allow pt to use bathroom. Overheard dgt telling the pt to say she cant walk. Found pt in squatting position cleaning herself after BM. Upon trying to get her  Out of the bathroom pt was having great diffculty ambulating. CN aware and spoke with dgt.Call placed to MD and orders given.

## 2011-03-27 NOTE — Progress Notes (Signed)
Subjective: No complaints. Able to walk in the corridor without any issues  Objective: Weight change:   Intake/Output Summary (Last 24 hours) at 03/27/11 1946 Last data filed at 03/27/11 1917  Gross per 24 hour  Intake    840 ml  Output    100 ml  Net    740 ml    Filed Vitals:   03/27/11 1359  BP: 129/96  Pulse: 91  Temp: 97.8 F (36.6 C)  Resp: 18    On Exam  She is alert afebrile and comfortable and oriented.  CVS: S1 S2 heard  LUngs: clear  Abdomen: soft non tender non distended bowel sounds heard.  Extremities: no pedal edema, cyanosis or clubbing  Lab Results: No results found for this or any previous visit (from the past 24 hour(s)).   Micro Results: Recent Results (from the past 240 hour(s))  MRSA PCR SCREENING     Status: Normal   Collection Time   03/22/11  9:56 PM      Component Value Range Status Comment   MRSA by PCR NEGATIVE  NEGATIVE  Final   CLOSTRIDIUM DIFFICILE BY PCR     Status: Normal   Collection Time   03/23/11  8:47 AM      Component Value Range Status Comment   C difficile by pcr NEGATIVE  NEGATIVE  Final     Studies/Results: Dg Chest 1 View  03/22/2011  *RADIOLOGY REPORT*  Clinical Data: Short of breath.  Pulmonary fibrosis.  Pancreatic cancer.  CHEST - 1 VIEW  Comparison: 11/04/2010.  Findings: Patient is rotated to the right.  Right IJ Port-A- Cath/power port is unchanged.  Low lung volumes with basilar atelectasis.  No airspace disease.  No effusion.  Cardiopericardial silhouette is probably unchanged allowing for low inspiratory volumes.  IMPRESSION: Low volume chest.  Stable support apparatus.  No gross consolidation.  Original Report Authenticated By: Andreas Newport, M.D.   Ct Angio Chest W/cm &/or Wo Cm  03/22/2011  *RADIOLOGY REPORT*  Clinical Data:  Shortness of breath and rectal bleeding.  CT ANGIOGRAPHY CHEST CT ABDOMEN AND PELVIS WITH CONTRAST  Technique:  Multidetector CT imaging of the chest was performed using the standard  protocol during bolus administration of intravenous contrast.  Multiplanar CT image reconstructions including MIPs were obtained to evaluate the vascular anatomy. Multidetector CT imaging of the abdomen and pelvis was performed using the standard protocol during bolus administration of intravenous contrast.  Contrast: OMNIPAQUE IOHEXOL 300 MG/ML IV SOLN  Comparison:  CT chest 02/10/2010.  CTA CHEST  Findings:  The chest wall is unremarkable.  No breast masses, supraclavicular or axillary lymphadenopathy.  A right-sided Port-A- Cath is noted.  The bony thorax is intact.  Lytic and sclerotic appearing bone changes suspicious for metastatic disease.  The heart is normal in size.  No pericardial effusion.  There are scattered mediastinal and hilar lymph nodes but no adenopathy.  The esophagus is grossly normal.  The aorta is tortuous and mildly ectatic with atherosclerotic changes but no dissection or focal aneurysm.  Dense coronary artery calcifications are noted.  The pulmonary arterial tree is fairly well opacified.  No filling defects to suggest pulmonary emboli.  There is noted in the left brachiocephalic vein likely from the injection.  Examination of the lung parenchyma demonstrates pulmonary scarring and peribronchial thickening.  Vague nodular lung lesions.  Cannot exclude metastatic disease.  No edema or effusions.   Review of the MIP images confirms the above findings.  IMPRESSION:  1.  No CT findings for pulmonary embolism. 2.  No mediastinal or hilar adenopathy. 3.  Scattered ill-defined pulmonary lesions.  Pulmonary metastatic disease is possible.  Close CT follow-up is recommended.  CT ABDOMEN AND PELVIS  Findings: A biliary stent is in place.  There is associated intrahepatic biliary air.  There is a large low attenuation area in the medial segment of the left hepatic lobe and also in part the right hepatic lobe.   This was not present on the prior examination.  It could be a treated lesion.   Vessels and the biliary tree run through this area and severe focal fat is possible.  Comparison with any more recent CT scan may be helpful. The gallbladder is markedly distended and gallbladder folds are noted.  Cystic duct obstruction is possible although no definite wall thickening or gallstones.  The pancreatic head demonstrates diffuse low attenuation without discrete mass.  The pancreatic body and tail are quite atrophied.  There is ill-defined soft tissue density surrounding the right side of the celiac access which is likely tumor.  Scattered small lymph nodes are noted also.  The spleen is normal in size.  No focal lesions.  The adrenal glands and kidneys are unremarkable.  The stomach is grossly normal.  The duodenum and small bowel are unremarkable.  The colon demonstrates severe and diffuse inflammation most consistent with C difficile colitis.  The surrounding inflammation and fluid.  The bladder has a Foley catheter in it.  Mild wall thickening. There is moderate free pelvic fluid.  No pelvic mass or adenopathy.  Very heterogeneous appearance of the bone marrow.  Cannot exclude metastatic disease.  Review of the MIP images confirms the above findings.  IMPRESSION:  1.  Large area of low attenuation in the liver could be treated disease or severe focal fat.  Vessels and the biliary tree run through this area so I do not think it is mass.  MRI may be helpful for further evaluation. 2.  Marked distention of the gallbladder. 3.  Ill-defined low attenuation of the pancreatic head with adjacent soft tissue tumor and small lymph nodes. 4.  Severe diffuse inflammation of the colon most consistent with C difficile colitis. 5.  Moderate amount of free pelvic fluid. 6.  Biliary stent in place.  Original Report Authenticated By: P. Loralie Champagne, M.D.   US Abdomen Complete  03/22/2011  *RADIOLOGY REPORT*  Clinical Data:  Abdominal pain, new transaminitis.  History of pancreatic cancer status post biliary stent  in 2011  COMPLETE ABDOMINAL ULTRASOUND  Comparison:  None.  Findings:  Gallbladder:  Gallbladder sludge.  No gallbladder wall thickening, pericholecystic fluid, or sonographic Murphy's sign.  Common bile duct:  Measures 10 mm.  Liver:  No focal lesion identified.  Pneumobilia, likely secondary to indwelling biliary stent.  IVC:  Appears normal.  Pancreas:  Poorly visualized.  Pancreatic duct stent in place.  Spleen:  Measures 8.1 cm.  Right Kidney:  Measures 9.6 cm.  No mass or hydronephrosis.  Left Kidney:  Measures 9.6 cm.  No mass or hydronephrosis.  Abdominal aorta:  No aneurysm identified.  Additional comments:  Ascites.  IMPRESSION: Pancreatic duct stent in place.  Secondary pneumobilia.  Gallbladder sludge, without associated findings to suggest acute cholecystitis.  Ascites.  Original Report Authenticated By: Charline Bills, M.D.   Ct Abdomen Pelvis W Contrast  03/22/2011  *RADIOLOGY REPORT*  Clinical Data:  Shortness of breath and rectal bleeding.  CT ANGIOGRAPHY CHEST CT ABDOMEN AND PELVIS  WITH CONTRAST  Technique:  Multidetector CT imaging of the chest was performed using the standard protocol during bolus administration of intravenous contrast.  Multiplanar CT image reconstructions including MIPs were obtained to evaluate the vascular anatomy. Multidetector CT imaging of the abdomen and pelvis was performed using the standard protocol during bolus administration of intravenous contrast.  Contrast: OMNIPAQUE IOHEXOL 300 MG/ML IV SOLN  Comparison:  CT chest 02/10/2010.  CTA CHEST  Findings:  The chest wall is unremarkable.  No breast masses, supraclavicular or axillary lymphadenopathy.  A right-sided Port-A- Cath is noted.  The bony thorax is intact.  Lytic and sclerotic appearing bone changes suspicious for metastatic disease.  The heart is normal in size.  No pericardial effusion.  There are scattered mediastinal and hilar lymph nodes but no adenopathy.  The esophagus is grossly normal.  The  aorta is tortuous and mildly ectatic with atherosclerotic changes but no dissection or focal aneurysm.  Dense coronary artery calcifications are noted.  The pulmonary arterial tree is fairly well opacified.  No filling defects to suggest pulmonary emboli.  There is noted in the left brachiocephalic vein likely from the injection.  Examination of the lung parenchyma demonstrates pulmonary scarring and peribronchial thickening.  Vague nodular lung lesions.  Cannot exclude metastatic disease.  No edema or effusions.   Review of the MIP images confirms the above findings.  IMPRESSION:  1.  No CT findings for pulmonary embolism. 2.  No mediastinal or hilar adenopathy. 3.  Scattered ill-defined pulmonary lesions.  Pulmonary metastatic disease is possible.  Close CT follow-up is recommended.  CT ABDOMEN AND PELVIS  Findings: A biliary stent is in place.  There is associated intrahepatic biliary air.  There is a large low attenuation area in the medial segment of the left hepatic lobe and also in part the right hepatic lobe.   This was not present on the prior examination.  It could be a treated lesion.  Vessels and the biliary tree run through this area and severe focal fat is possible.  Comparison with any more recent CT scan may be helpful. The gallbladder is markedly distended and gallbladder folds are noted.  Cystic duct obstruction is possible although no definite wall thickening or gallstones.  The pancreatic head demonstrates diffuse low attenuation without discrete mass.  The pancreatic body and tail are quite atrophied.  There is ill-defined soft tissue density surrounding the right side of the celiac access which is likely tumor.  Scattered small lymph nodes are noted also.  The spleen is normal in size.  No focal lesions.  The adrenal glands and kidneys are unremarkable.  The stomach is grossly normal.  The duodenum and small bowel are unremarkable.  The colon demonstrates severe and diffuse inflammation most  consistent with C difficile colitis.  The surrounding inflammation and fluid.  The bladder has a Foley catheter in it.  Mild wall thickening. There is moderate free pelvic fluid.  No pelvic mass or adenopathy.  Very heterogeneous appearance of the bone marrow.  Cannot exclude metastatic disease.  Review of the MIP images confirms the above findings.  IMPRESSION:  1.  Large area of low attenuation in the liver could be treated disease or severe focal fat.  Vessels and the biliary tree run through this area so I do not think it is mass.  MRI may be helpful for further evaluation. 2.  Marked distention of the gallbladder. 3.  Ill-defined low attenuation of the pancreatic head with adjacent soft  tissue tumor and small lymph nodes. 4.  Severe diffuse inflammation of the colon most consistent with C difficile colitis. 5.  Moderate amount of free pelvic fluid. 6.  Biliary stent in place.  Original Report Authenticated By: P. Loralie Champagne, M.D.   Medications: Scheduled Meds:   . donepezil  10 mg Oral QHS  . guaiFENesin  400 mg Oral Daily  . neomycin-polymyxin-hydrocortisone  3 drop Right Ear TID  . pantoprazole  40 mg Oral BID AC  . polyethylene glycol  17 g Oral Daily  . predniSONE  5 mg Oral Daily  . sodium chloride  250 mL Intravenous Once   Continuous Infusions:  PRN Meds:.acetaminophen, acetaminophen, albuterol, ALPRAZolam, alum & mag hydroxide-simeth, ondansetron (ZOFRAN) IV, ondansetron, simethicone, sodium chloride  Assessment/Plan: Patient Active Hospital Problem List: Gi bleed: Her H&H appears stable around 9. No more bloody bowel movements, she underwent a colonoscopy showed multiple polyps and hemorroids. She is cleared from GI perspective to be discharged and further follow up as outpatient. SHE WILL BE OFF ANTICOAGULATION.   Pancreatic adenocarcinoma: (03/05/2010): CT abd shows recurrent pancreatic cancer with some pulmonary nodules consistent with metastatic disease, recomment  outpatient follow up with [facility] with repeat scans. Ca 19 LOW.  status post biliary stent placement. on 02/17/2000. Status post  single-agent gemcitabine from 03/24/2010 through 06/01/2010. She  is currently undergoing surveillance, receiving periodic scans at  [facility]. She will be following up at [facility] later this month with scans   DVT of upper extremity (deep vein thrombosis) : anticoagulated with Coumadin. A Doppler of upper extremity was negative. The CT scans continue to demonstrate no progression of  disease. We will discontinue anticoagulation   Anemia of chronic disease:. The patient has received Aranesp in the  past. As her hemoglobin dropped to less than 10 , she might require Aranesp injections at her oncology office as outpatient when she gets discharged. She also has a h/o MM.  Shortness of breath: resolved.cardian enzymes are negative. CT chest was negative for PE. 2D echocardiogram is done does not show any regional wall abnormalities.  Hypokalemia: repleted.  Mild Thrombocytopenia: stable continue to monitor.  Elevated Liver Function tests: Most likely secondary to hepatic lesions from the pancreatic adenocarcinoma. Will need further imaging to define the liver lesions, possibly MRI, which can be done as outpatient.  Diposition: initially she was planned to discharge home with home pt. But pt states she doesn't feel safe to go home by herself and wants to consider Rehabilitation for a few weeks before going home. Will call CSW to come and assist with the disposition to rehab   LOS: 5 days   Dawnn Nam 03/27/2011, 7:46 PM

## 2011-03-27 NOTE — Progress Notes (Signed)
Patient and daughter are agreeable to considering ST SNF at d/c. CSW Psychosocial Assessment and FL2 placed in shadow chart in [facility]. Will proceed with SNF search and advise. Reece Levy, MSW, LCSWA [phone]/weekend coverage

## 2011-03-27 NOTE — Progress Notes (Signed)
Cm spoke with patient with daughter at bedside concerning d/c planning. CM explained to daughter option of home health services vs.rehab facility. PT recommends HHPT. Daughter expressed concerns of being able to care for patient at home due to weakness and bowel/urine incontinence. Daughter is requesting information about rehab. CSW [place] on-call this weekend notified.  Leonie Green [phone]

## 2011-03-28 ENCOUNTER — Telehealth: Payer: Self-pay | Admitting: *Deleted

## 2011-03-28 ENCOUNTER — Telehealth: Payer: Self-pay | Admitting: Pulmonary Disease

## 2011-03-28 MED ORDER — ONDANSETRON HCL 4 MG PO TABS: 4.0000 mg | ORAL_TABLET | Freq: Four times a day (QID) | ORAL | Status: AC | PRN

## 2011-03-28 MED ORDER — HEPARIN SOD (PORK) LOCK FLUSH 100 UNIT/ML IV SOLN
500.0000 [IU] | INTRAVENOUS | Status: AC | PRN
  Administered 2011-03-28: 500 [IU]

## 2011-03-28 NOTE — Discharge Summary (Signed)
DISCHARGE SUMMARY  Heather Dorsey  MR#: [id number]  DOB:1923/05/23  Date of Admission: 03/22/2011 Date of Discharge: 03/28/2011  Attending Physician:Lashena Signer  Patient's YQM:VHQIO,NGEXB Judie Petit, MD, MD  Consults:Treatment Team:  Theda Belfast, MD Vicente Serene I. Odogwu, MD  Discharge Diagnoses: Present on Admission:  .BRONCHITIS, RECURRENT .ADENOCARCINOMA, PANCREAS .DVT of upper extremity (deep vein thrombosis) .MULTIPLE  MYELOMA .GI bleeding    Current Discharge Medication List    START taking these medications   Details  ondansetron (ZOFRAN) 4 MG tablet Take 1 tablet (4 mg total) by mouth every 6 (six) hours as needed for nausea. Qty: 20 tablet, Refills: 0      CONTINUE these medications which have CHANGED   Details  polyethylene glycol powder (MIRALAX) powder Take 17 g by mouth daily as needed. Qty: 255 g, Refills: 0      CONTINUE these medications which have NOT CHANGED   Details  acetaminophen (TYLENOL) 325 MG tablet Take 325 mg by mouth every 6 (six) hours as needed.     ALPRAZolam (XANAX) 0.5 MG tablet Take 0.5 mg by mouth as needed.     B Complex Vitamins (VITAMIN B COMPLEX PO) daily     Cholecalciferol (VITAMIN D) 1000 UNITS capsule Take 1,000 Units by mouth daily.      dexlansoprazole (DEXILANT) 60 MG capsule Take 60 mg by mouth as needed.     donepezil (ARICEPT) 5 MG tablet TAKE 1 TABLET EVERY DAY Qty: 90 tablet, Refills: 1    Ferrous Sulfate 83 MG TABS Take 1 tablet by mouth daily.      fish oil-omega-3 fatty acids 1000 MG capsule Take 2 g by mouth daily.     Associated Diagnoses: Anemia; DVT (deep venous thrombosis); Pancreatic cancer    guaifenesin (MUCUS RELIEF) 400 MG TABS Take 400 mg by mouth daily.     megestrol (MEGACE) 20 MG tablet TAKE 1 TABLET EVERY DAY Qty: 90 tablet, Refills: 1    Multiple Vitamin (MULTIVITAMIN) capsule Take 1 capsule by mouth daily. velocity     NEOMYCIN-POLYMYXIN-HC, OTIC, (CORTISPORIN) 1 % SOLN Place 3 drops into  the right ear 3 (three) times daily. Qty: 1 Bottle, Refills: 0    predniSONE (DELTASONE) 5 MG tablet Take 1 tablet (5 mg total) by mouth daily. Qty: 30 tablet, Refills: 11    simethicone (MYLICON) 125 MG chewable tablet Chew 125 mg by mouth every 6 (six) hours as needed.      vitamin E (VITAMIN E) 400 UNIT capsule Take 400 Units by mouth daily.     Associated Diagnoses: Anemia; DVT (deep venous thrombosis); Pancreatic cancer    Misc. Devices (WALKER) MISC Rolling walker with seat Dx 715.90 & 714.0 Qty: 1 each, Refills: 0      STOP taking these medications     aspirin 81 MG tablet      docusate sodium (COLACE) 100 MG capsule      warfarin (COUMADIN) 4 MG tablet       Brief Hospital Course:  78 year old  [place] with h/o adenocarcinoma , MULTIPLE myeloma and DVT of the upper extremity admitted for lower GI bleed with crampy abdominal pain. She was admitted to hospitalist for evaluation of gi bleed in a patient on coumadin. Further a GI consult was obtained for possible colonoscopy. She underwent a colonoscopy which showed multiple pop lys,.  Hospital Course: Present on Admission:   GI bleeding (03/22/2011) Her H&H appears stable around 9. No moe bloody bowel movements. She underwent 1 units of  FFP for reversal of coagulopathy. Gastroenterology consult was obtained, and she underwent colonoscopy which showed multiple polyps.Polyps were not removed, as there might be possibility of bleeding. Dr Elnoria Howard did not think she has upper source of bleeding. And the possibility of hemorroidal bleeding could also have taken place.   Pancreatic adenocarcinoma: (03/05/2010): CT abd shows recurrent pancreatic cancer with some pulmonary nodules consistent with metastatic disease, recomment outpatient follow up with [facility] with repeat scans. Ca 19 ordered.  status post biliary stent placement. on 02/17/2000. Status post  single-agent gemcitabine from 03/24/2010 through 06/01/2010. She  is currently undergoing  surveillance, receiving periodic scans at  [facility]. She will be following up at [facility] later this month with scans   DVT of upper extremity (deep vein thrombosis) : anticoagulated with Coumadin. A Doppler of upper extremity was negative. The CT scans continue to demonstrate no progression of  disease. We will discontinue anticoagulation   Anemia of chronic disease:. The patient has received Aranesp in the  past. As her hemoglobin dropped to less than 10 , she might require Aranesp injections at her oncology office as outpatient when she gets discharged. She also has a h/o MM.  Shortness of breath: resolved.cardian enzymes are negative. CT chest was negative for PE. 2D echocardiogram is done does not show any regional wall abnormalities.  Hypokalemia: repleted.  Mild Thrombocytopenia: stable continue to monitor.  Elevated Liver Function tests: Most likely secondary to hepatic lesions from the pancreatic adenocarcinoma. Will need further imaging to define the liver lesions, possibly MRI, which can done as outpatient.   Pt's family wanted to speak to Dr Dalene Carrow, called her office and was told she is out of office and the patient and her daughter will be called for a follow up appointment.   Day of Discharge BP 109/75  Pulse 90  Temp(Src) 98.1 F (36.7 C) (Oral)  Resp 16  Ht 5\' 4"  (1.626 m)  Wt 72.6 kg (160 lb 0.9 oz)  BMI 27.47 kg/m2  SpO2 99%  Physical Exam: unchanged.  On Exam  She is alert afebrile and comfortable and oriented.  CVS: S1 S2 heard  LUngs: clear  Abdomen: soft non tender non distended bowel sounds heard.  Extremities: no pedal edema, cyanosis or clubbing   No results found for this or any previous visit (from the past 24 hour(s)).  Disposition:D/c to SNF.    Follow-up Appts: Discharge Orders    Future Appointments: Provider: Department: Dept Phone: Center:   05/18/2011 11:30 AM Michele Mcalpine, MD [facility] [phone] None   06/21/2011 8:30 AM Marla Roe [facility] [phone] None   06/21/2011 9:00 AM Lauretta I. Odogwu, MD [facility] [phone] None     Future Orders Please Complete By Expires   Diet - low sodium heart healthy      Diet - low sodium heart healthy      Increase activity slowly      Discharge instructions      Comments:   Follow up with Dr Dalene Carrow and Dr Elnoria Howard as recommended. Please call their offices on Monday morning 03/28/11 for appointments.   Increase activity slowly      Discharge instructions      Comments:   Follow up with Dr Dalene Carrow in one week.      Follow-up Information    Follow up with NADEL,SCOTT M, MD in 1 week.      Follow up with ODOGWU,LAURETTA I., MD in 1 week.   Contact information:  [address] [place] [address] [phone]       Follow up with Theda Belfast, MD. (As needed)    Contact information:   [address] [place] [address] [phone]       Follow up with oncologist at [facility] in 1 week. (As needed)          Tests Needing Follow-up: CBC IN ONE WEEK.   Time spent in discharge (includes decision making & examination of pt): 50 minutes  Signed: Braun Rocca 03/28/2011, 2:12 PM

## 2011-03-28 NOTE — Telephone Encounter (Signed)
Per SN---SN has only limited roll in the hospital now but SN does review all of the notes and happening at the hospital via computer.  Seems to SN that she is getting excellent care from the hospitalists and we usually assume care again after discharge from the hospital and we will be able to set appt within a week of discharge.  thanks

## 2011-03-28 NOTE — Progress Notes (Signed)
Physical Therapy Treatment Patient Details Name: JOETTE SCHMOKER MRN: [id number] DOB: 1924/01/19 Today's Date: 03/28/2011 Time: 4098-1191 Charge: Leonia Reeves 2 PT Assessment/Plan  PT - Assessment/Plan Comments on Treatment Session: Pt able to tolerate increased ambulation distance today and reported no SOB with activity. PT Plan: Discharge plan remains appropriate;Frequency remains appropriate Follow Up Recommendations: Home health PT;Supervision for mobility/OOB Equipment Recommended: None recommended by PT PT Goals  Acute Rehab PT Goals Pt will go Supine/Side to Sit: with modified independence;with HOB 0 degrees PT Goal: Supine/Side to Sit - Progress: Revised (modified due to lack of progress/goal met) Pt will go Sit to Supine/Side: with modified independence;with HOB 0 degrees PT Goal: Sit to Supine/Side - Progress: Revised (modified due to lack of progress/goal met) Pt will go Sit to Stand: with supervision;with upper extremity assist PT Goal: Sit to Stand - Progress: Updated due to goal met PT Goal: Stand to Sit - Progress: Progressing toward goal PT Goal: Ambulate - Progress: Progressing toward goal  PT Treatment Precautions/Restrictions  Precautions Precautions: Fall Restrictions Weight Bearing Restrictions: No Mobility (including Balance) Bed Mobility Bed Mobility: Yes Supine to Sit: HOB elevated (Comment degrees);With rails Supine to Sit Details (indicate cue type and reason): HOB 25*, verbal cue to use rail, increased time Sitting - Scoot to Edge of Bed: 5: Supervision;With rail Sitting - Scoot to Edge of Bed Details (indicate cue type and reason): increased verbal cues to scoot hips forward and place feet on floor Transfers Transfers: Yes Sit to Stand: 4: Min assist;From elevated surface;With upper extremity assist;From chair/3-in-1;From bed Sit to Stand Details (indicate cue type and reason): assist for weakness, verbal cues for safe technique Stand to Sit: 4: Min assist;With  upper extremity assist;To chair/3-in-1 Stand to Sit Details: verbal cues for safe technique, visual cues for hand placement, assist to control descent Ambulation/Gait Ambulation/Gait: Yes Ambulation/Gait Assistance: 4: Min assist Ambulation/Gait Assistance Details (indicate cue type and reason): pt initially minA with straight cane ambulating to bathroom with assist for balance and steadying, pt min/guard in hallway with RW, increased verbal cues for posture and safe RW distance Ambulation Distance (Feet): 160 Feet Assistive device: Rolling walker Gait Pattern: Trunk flexed    Exercise    End of Session PT - End of Session Equipment Utilized During Treatment: Gait belt Activity Tolerance: Patient limited by fatigue Patient left: in chair;with call bell in reach;with family/visitor present (chair alarm) General Behavior During Session: [facility] for tasks performed Cognition: Impaired  Shelene Krage,KATHrine E 03/28/2011, 3:35 PM Pager: [phone]

## 2011-03-28 NOTE — Telephone Encounter (Signed)
I spoke with Heather Dorsey and she states she wants to make sure Dr. Kriste Basque is getting all her records that they are doing over in the hospital and feels like they (hospitalists) are doing the right thing with her. Will forward to NS

## 2011-03-28 NOTE — Telephone Encounter (Signed)
lmomtcb x1 for [employer]

## 2011-03-28 NOTE — Progress Notes (Signed)
03/28/11 @2000 -Pts daughter came to floor and is very upset. States she got to [facility] and it was "like a prison" so she took her mother home. States that [employer] wouldn't give her any paperwork because she was taking her mother out of the facillity AMA.  Now insisting on getting some discharge papers. This RN gave the daughter a copy of the AVS report but daughter was warned that the information may not be completely accurate because the patients discharge summary is the SNF's discharge orders (I am unsure if this is still the case with EPIC but wanted to warn her to that fact anyway). Daughter also states that patient had plastic bag with belongings missing from room. Tresa Endo (house coverage A/C) was notified and is going to come speak with Mrs Dingles daughter. Ginny Forth

## 2011-03-28 NOTE — Progress Notes (Signed)
Patient set to discharge to [facility] today. Daughter @ bedside and aware. [facility] called for transport to pick up @ 3:30.   Unice Bailey, LCSWA [phone]

## 2011-03-28 NOTE — Telephone Encounter (Signed)
Received call from Dr. Blake Divine re:  Pt will be discharged today;  However,  Pt/daughter would like to speak with Dr. Dalene Carrow before discharge.   Informed Dr. Blake Divine that Dr. Dalene Carrow was not in office today.    Dr. Blake Divine stated daughter would like for pt to be seen within next couple of days .   Informed Dr. Blake Divine that daughter will be contacted with Dr. Lonell Face decision at home when md returns to office.   Dr.  Blake Divine stated it would be fine. Dr. Darci Needle    Pager       [phone]   ;    Cell      [phone].

## 2011-03-29 ENCOUNTER — Other Ambulatory Visit: Payer: Self-pay | Admitting: *Deleted

## 2011-03-29 ENCOUNTER — Telehealth: Payer: Self-pay | Admitting: *Deleted

## 2011-03-29 ENCOUNTER — Telehealth: Payer: Self-pay | Admitting: Pulmonary Disease

## 2011-03-29 DIAGNOSIS — J8410 Pulmonary fibrosis, unspecified: Secondary | ICD-10-CM

## 2011-03-29 DIAGNOSIS — C259 Malignant neoplasm of pancreas, unspecified: Secondary | ICD-10-CM

## 2011-03-29 DIAGNOSIS — F0150 Vascular dementia without behavioral disturbance: Secondary | ICD-10-CM

## 2011-03-29 DIAGNOSIS — C9000 Multiple myeloma not having achieved remission: Secondary | ICD-10-CM

## 2011-03-29 MED ORDER — FUROSEMIDE 20 MG PO TABS: 20.0000 mg | ORAL_TABLET | Freq: Every day | ORAL | Status: DC

## 2011-03-29 NOTE — Telephone Encounter (Signed)
Spoke with Heather Dorsey. She states that the pt was d/c from hospital yesterday- at that time had mild swelling in her hands and feet. This am when the pt woke up she was swollen in her face, neck, hands, arms, legs and feet. She states that this is worse than when she was in the hospital. She had some SOB this am when she woke up, and once she was able to "spit up fluid" she felt better- daughter relates this to her PND at hs. She states that the pt is not feeling bad now, just has a lot of edema and daughter states that she refuses to go back to the hospital. She states wants to know what SN will recommend and if it is possible to have a home health nurse come to see the pt. I advised will forward msg to SN for recs and take her to ED sooner if sx persist/worsen. Please advise, thanks!

## 2011-03-29 NOTE — Telephone Encounter (Signed)
Pt's daughter [place] Sink called back again. Says that she needs an order to be [facility] to [facility] for "cna to come in to care for pt". Wants a callback asap. Also wants to speak to libby. [phone]. Hazel Sams

## 2011-03-29 NOTE — Telephone Encounter (Signed)
Spoke with pt's daughter and notified of recs per SN. She verbalized understanding and order was sent to [facility] and also lasix sent to pharm. Appt with SN for 1/18 at 2 pm.

## 2011-03-29 NOTE — Telephone Encounter (Signed)
Per SN----yes we can get home health nursing to come in and help.  Start with lasix 20mg    1 every day  #30  With 5 refills and ov with SN on Friday at 2pm

## 2011-03-29 NOTE — Telephone Encounter (Signed)
Spoke with daughter [place] Sink and gave her date and time for f/u with Melina Schools, NP at 245 pm on 04/12/11.

## 2011-03-30 ENCOUNTER — Telehealth: Payer: Self-pay | Admitting: *Deleted

## 2011-03-30 ENCOUNTER — Telehealth: Payer: Self-pay | Admitting: Pulmonary Disease

## 2011-03-30 MED ORDER — LOPERAMIDE HCL 2 MG PO TABS: ORAL_TABLET | ORAL | Status: AC

## 2011-03-30 MED ORDER — ALIGN 4 MG PO CAPS: 1.0000 | ORAL_CAPSULE | Freq: Every day | ORAL | Status: DC

## 2011-03-30 NOTE — Telephone Encounter (Signed)
Spoke with Heather Dorsey-states that Heather Dorsey was discharged from hospital on Monday evening and was given Lasix to help with edema; pt has only taken a few since then-still having increased edema in face,neck, arms, and feet. Also has had 8 bowel movements(diarrhea) today-pt is just wanting to sleep and unable to walk. She isn't eating much-has had some juice and 1/2 bottle of Boost today. Please advise. Thanks.

## 2011-03-30 NOTE — Telephone Encounter (Signed)
Received call from daughter, [place] Sink, asking if there was an MD that made house calls.  I reported that our docs do not usually.  She reports that her mom was released from the hospital recently after being in ICU with internal bleeding related to coumadin.  She reports that she is swollen all over & has been incontinent of bowels several times this am.  She is upset & states that her mother does not want to come back to [facility] & she is upset with Dr. Dalene Carrow.  Discussed with Dr. Dalene Carrow & her nurse & Thu RN will return a call to her.  Per Dr Dalene Carrow, Dr. Truett Perna has agreed to see her.

## 2011-03-30 NOTE — Telephone Encounter (Signed)
Per SN: diuretics do not cause diarrhea.  rec imodium OTC 1 every 4-6h as needed for the watery diarrhea and align probiotic once daily.  Called spoke with [place] Sink, advised of SN's recs as stated by SN above.  Pt daughter okay with these recs and verbalized her understanding.  meds added to med list.  [place] Sink reported that pt's edema is not improved with the lasix.  Per SN, continue the lasix as directed and will discuss further at [facility] on 1.18.13.  [place] Sink okay with these recs.

## 2011-03-30 NOTE — Telephone Encounter (Signed)
Clinical Social Work received referral by Vicie Mutters, patient navigator, regarding patient's care and future appointment with Dr. Truett Perna. Pt's daughter recently discharged patient from [facility] and is currently caring for the patient in the home. The patient plans to see Dr. Truett Perna and CSW called and left voicemail that our office will be calling in the next few days to notify pt's dtr [place] Sink of appointment time. CSW requested pt's dtr call back to assess for needs at this time. Kathrin Penner, MSW, [facility] Clinical Social Worker [facility] [phone]

## 2011-03-30 NOTE — Telephone Encounter (Signed)
lmomtcb x1 

## 2011-03-30 NOTE — Telephone Encounter (Signed)
Spoke with daughter  [place] Sink and informed her re:  Dr. Dalene Carrow had spoken with Dr. Truett Perna.   Dr.  Truett Perna has agreed to take over pt's care.    Informed [place] Sink that a scheduler will contact her with appt with Dr. Truett Perna.  [place] Sink stated she had signed a release of pt's records from  [facility]  To  Our  Office  For records to be obtained.

## 2011-03-30 NOTE — Telephone Encounter (Signed)
Late entry for 03/29/11:  Spoke with daughter [place] Sink and offered a f/u appt with Marlowe Kays , PA /Dr. Dalene Carrow  For pt on 03/30/11.   [place] Sink stated pt was just discharged from the hospital and weak; pt could not walk much at present.   [place] Sink also stated pt had appt with Dr. Kriste Basque on 04/01/11 , and she would hope but not sure that pt could walk by then.   RN gave pt another f/u appt with Melina Schools, NP/Dr. Dalene Carrow for 04/11/10.   [place] Sink stated she would take appt. But she would call if pt could not keep this appt.

## 2011-03-31 ENCOUNTER — Other Ambulatory Visit: Payer: Self-pay | Admitting: Hematology and Oncology

## 2011-03-31 NOTE — Telephone Encounter (Signed)
Will close message. See phone note from 03/30/11 for further details.  Pt already has post hosp f/u appt scheduled for 04/01/11 with SN.

## 2011-04-01 ENCOUNTER — Encounter: Payer: Self-pay | Admitting: Pulmonary Disease

## 2011-04-01 ENCOUNTER — Telehealth: Payer: Self-pay | Admitting: *Deleted

## 2011-04-01 ENCOUNTER — Ambulatory Visit (INDEPENDENT_AMBULATORY_CARE_PROVIDER_SITE_OTHER): Payer: Medicare Other | Admitting: Pulmonary Disease

## 2011-04-01 ENCOUNTER — Other Ambulatory Visit (INDEPENDENT_AMBULATORY_CARE_PROVIDER_SITE_OTHER): Payer: Medicare Other

## 2011-04-01 DIAGNOSIS — J8410 Pulmonary fibrosis, unspecified: Secondary | ICD-10-CM

## 2011-04-01 DIAGNOSIS — M1990 Unspecified osteoarthritis, unspecified site: Secondary | ICD-10-CM

## 2011-04-01 DIAGNOSIS — R7989 Other specified abnormal findings of blood chemistry: Secondary | ICD-10-CM

## 2011-04-01 DIAGNOSIS — M899 Disorder of bone, unspecified: Secondary | ICD-10-CM

## 2011-04-01 DIAGNOSIS — F0150 Vascular dementia without behavioral disturbance: Secondary | ICD-10-CM

## 2011-04-01 DIAGNOSIS — M069 Rheumatoid arthritis, unspecified: Secondary | ICD-10-CM

## 2011-04-01 DIAGNOSIS — D638 Anemia in other chronic diseases classified elsewhere: Secondary | ICD-10-CM

## 2011-04-01 DIAGNOSIS — C9000 Multiple myeloma not having achieved remission: Secondary | ICD-10-CM

## 2011-04-01 DIAGNOSIS — R945 Abnormal results of liver function studies: Secondary | ICD-10-CM | POA: Insufficient documentation

## 2011-04-01 DIAGNOSIS — K922 Gastrointestinal hemorrhage, unspecified: Secondary | ICD-10-CM

## 2011-04-01 DIAGNOSIS — E876 Hypokalemia: Secondary | ICD-10-CM

## 2011-04-01 DIAGNOSIS — R609 Edema, unspecified: Secondary | ICD-10-CM

## 2011-04-01 DIAGNOSIS — C259 Malignant neoplasm of pancreas, unspecified: Secondary | ICD-10-CM

## 2011-04-01 LAB — CBC WITH DIFFERENTIAL/PLATELET
Basophils Relative: 0.1 % (ref 0.0–3.0)
Eosinophils Absolute: 0.1 10*3/uL (ref 0.0–0.7)
Hemoglobin: 10.2 g/dL — ABNORMAL LOW (ref 12.0–15.0)
Lymphocytes Relative: 4.1 % — ABNORMAL LOW (ref 12.0–46.0)
MCHC: 33.8 g/dL (ref 30.0–36.0)
Monocytes Relative: 5 % (ref 3.0–12.0)
Neutro Abs: 7.9 10*3/uL — ABNORMAL HIGH (ref 1.4–7.7)
RBC: 3.23 Mil/uL — ABNORMAL LOW (ref 3.87–5.11)

## 2011-04-01 LAB — BASIC METABOLIC PANEL
CO2: 20 mEq/L (ref 19–32)
Calcium: 8.3 mg/dL — ABNORMAL LOW (ref 8.4–10.5)
Creatinine, Ser: 0.5 mg/dL (ref 0.4–1.2)
GFR: 137.9 mL/min (ref >60.00)

## 2011-04-01 LAB — HEPATIC FUNCTION PANEL
Bilirubin, Direct: 0.9 mg/dL — ABNORMAL HIGH (ref 0.0–0.3)
Total Protein: 6.2 g/dL (ref 6.0–8.3)

## 2011-04-01 MED ORDER — FUROSEMIDE 20 MG PO TABS: ORAL_TABLET | ORAL | Status: AC

## 2011-04-01 NOTE — Patient Instructions (Signed)
Today we updated your med list in our EPIC system...    Continue your current medications the same...  We decided to increase the LASIX fluid pill from 20mg /d to 40mg /d taken in the AM...    Be sure to eliminate all the Sodium/salt that you can from the diet...    Keep legs elevated as much as possible...  Today we did your follow up blood work...    Please call the PHONE [facility]REE in a few days for your results...    Dial [id number] & when prompted enter your patient number followed by the # symbol...    Your patient number is:  [id number]#  Let's see what DrSherrill & the Oncologists at [facility] have to say about the status of her pancreatic cancer...  Call if we can be of any assistance to you...    We will arrange for the visiting nurses & home PT/ OT...  Let's plan a follow up appt here in 3-4 weeks after she is seen at [facility] again.Marland KitchenMarland Kitchen

## 2011-04-02 ENCOUNTER — Encounter: Payer: Self-pay | Admitting: Pulmonary Disease

## 2011-04-02 ENCOUNTER — Telehealth: Payer: Self-pay | Admitting: Internal Medicine

## 2011-04-02 NOTE — Telephone Encounter (Signed)
Daughter called requesting we request the PET scan now instead of waiting and is frustrated it wasn't done while she was in the hospital so we could "get ahead of this cancer"  Informed her that we don't do PET scans in this setting and the best choice would be to discuss the state of the cancer with Drs Elnoria Howard and Truett Perna and the reasoning behind the decisions if and when to treat further.  The pt is comfortable at rest in bed but intake is poor and visiting nurse rec re-eval so no choice but go to ER if condition worsens.

## 2011-04-02 NOTE — Progress Notes (Signed)
Subjective:    Patient ID: Heather Dorsey, female    DOB: 03/16/1923, 78 y.o.   MRN: [id number]  HPI 78 y/o BF here for a follow up visit... she has multiple medical problems as noted below... Followed for general medical purposes w/ hx of seroneg RA & underlying pulm fibrosis, DJD & LBP, Osteopenia, mild dementia, and Monoclonal Gammopathy/ smoldering myeloma per DrOdogwu... also followed by York Pellant for Rheum, and DrLucey for Ortho >> NOTE:  Then she was hosp 11/11 w/ jaundice & abd discomfort w/ dx of Pancreatic Cancer...  ~  March 05, 2010:  she was hosp 11/11 w/ abd discomfort & jaundice> diagnosed w/ pancreatic cancer w/ stent placed in common bile duct >> she was transfered to [facility] (I do not have access to any [facility] notes- all sent to DrOdogwu, Oncology)...  ~  Jul 23, 2010:  4mo ROV & add-on for right ear symptoms>  She has been followed for her pancreatic cancer by DrOdogwu here & DrO'Neil at [facility], daughter mentions that they have stopped her chemoRx (last chemo given 3/12);  She went to [facility] for hearing eval & the technician saw something in her right ear & was told to come right away> they note decr hearing in right ear comes & goes, itching & Rx w/ olive oil on Qtip, they deny nasal problems;  Exam shows ?right OM w/ effusion ==> rec Augmentin, Dosepak, Cortisporin, & refer to ENT in case tube needed (eval by [facility] showed hearing loss & 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  Lab data in EPIC reviewed from March-April, last Oncology note we have is from 2/12 & reviewed w/ daughter...  ~  October 28, 2010:  76mo ROV & she has been followed very closely by DrOdogwu w/ labs every 54mo; she has lost some weight from 163# to 155#, appetite fair, eating ok she says; c/o tired, SOB, sleeping all the time, gas pains in right lower rib area, forgetful> & she has seen both DrOdogwu & DrO'Neil at [facility], and they both suggested f/u w/ her primary for these symptoms;  We discussed  her diagnoses, plan to re-eval CXR (no ch in fibrosis, NAD) & blood work (Chems- ok x BS145, CBC- Hg11.8, TSH norm) w/ rec for increased exercise program eg- silver sneakers vs, senior center; & Protonix, Mylicon, Phazyme rx... They request refill Rxs today...  ~  January 18, 2011:  76mo ROV & she notes some facial swelling that she thinks is due to bee pollen capsules that she's been taking;  Also c/o mucus in chest & throat esp at night & early AM> we discussed Rx w/ MUCINEX 2Bid & lots of water, she also requests cough syrup & we wrote for Hycodan Prn use...  In the interim she was Dx w/ RUE superficial phlebitis by DrOdogwu & she remains on COUMADIN per [facility] (currently on 2mg  tabs- taking , 2TThSS) & they follow her protimes...  She gets freq labs at the [facility] & reviewed in EPIC...  She is felt to have stable disease...  ~  April 01, 2011:  76mo ROV & add-on for post hosp visit> she was hosp 1/8 - 03/28/11 by [facility] for lower GI/ rectal bleed that appears to have been from Coagulopathy due to Coumadin & Hemorrhoids (NOTE: her protimes from the [facility] were prev very stable & then sudden coagulopathy on the same dose of coumadin was due to sudden incr in LFTs noted on adm compared to  prev normal values);  DrHung was consulted for GI, but she was apparently not seen by her Oncology team, DrOdogwu;  I have reviewed the [facility] records>>     >Labs on Adm 03/22/11 showed Hg=10.9, Protime=36.6/ INR=3.62, AlkPos=320 (12/5=97), SGOT=408 (12/5=25), SGPT=181 (12/5=26), Lipase=25, Ca19-9=3.7, CXR= R IJ port, known pulm fibrosis, NAD;      >She was given FFP, VitK?  Decision to stop Coumadin (VenDoppler of RUE 02/16/11 was neg)    >Colonoscopy1/10/13 by [facility] showed several polyps (largest 1cm at hepatic flexure) not biopsied or removed due to recent bleed; redundant colon, rectal inflamm was biopsied & path showed     >AbdUltrasound 03/22/11 showed GB sludge but no signs of acute dis, biliary  stent w/ pneumobilia, +ascites noted...    >CT Chest/Abd/Pelvis 03/22/11 revealed lytic & sclerotic bone bone changes suspicious for metastatic cancer, pulm scarring & "vague nodular areas" cannot r/o mets; large low attenuation area in medial left hep lobe into part of right hep lobe (needs comparison to her prev scans at [facility]), distended GB, low attenuation at head of pancreas, ill-defined soft tissue density surrounding the celiac axis (likely tumor), severe/diffuse inflamm of colon (but CDiff was neg), scattered sm lymph nodes; mod pelvic fluid, heterogeneous appearance of bone marrow (?met dis, but she also has a smoldering MultMyeloma)    >Addition studies done: 2DEcho showed     >Additional studies NOT done: Elev LFTs felt to be due to liver abn seen on CTAbd but not further investigated by TH/GI/etc (if this is metastatic dis it is a marked change & represents rapid progression of her pancreatic cancer but this hasn't been proven as yet & would have been nice to have gotten all this data in the hosp where it would have been much easier on the pt)...      >At the time of disch last labs revealed:  Hg=9.3,  BMet=wnl,  AlkPhos=237, SGOT=127, SGPT=160    >She was disch to [facility] for rehab (due to weakness, debilitation, etc) but daughter immediately removed her from the facility once she got there & checked it out finding it unsuitable & took her mother back home; she called [place] & we arranged for visiting nurses, home PT/OT, DME needs, etc...    >Since her discharge pt & daugh have noted marked incr in swelling in feet, hands, face w/ weight up etc; we called in Lasix20mg /d several days ago but no improvement noted so far & we discussed incr Lasix to 40mg  Qam, no salt, elev legs, support hose, etc...    >Daughter tells me she is switching local Oncologist to DrSherrill & they have appt next week...    >Labs today showed: K=3.1 & we will supplement w/ K20Bid; Renal= wnl (ok for Lasix40); LFTs  further improved; Hg=10.2...    >Discussed all this w/ pt & her daughter- she needs rapid assessment to determine if indeed she has progressing pancreatic cancer, metastatic dis, etc; then they can decide if additional therapy is possible or is it time to call in the Hospice team (PET scan would be my 1st choice, they have appt w/ DrSherrill 1/24 & want to wait but I am concerned she may require re-hosp in the interim)...         Problem List:  ALLERGIC RHINITIS (ICD-477.9) - we discussed OTC meds avail to her for her allergy symptoms- Claritin, Zyrtek, nasal saline, etc...  BRONCHITIS, RECURRENT (ICD-491.9) - recent change in cough, but no sputum, fever, etc... Rx for [facility]  cough syrup for prn use...  PULMONARY FIBROSIS (ICD-515) - on PREDNISONE 5mg /d now & stable,  NEB w/ Albut Prn, & GFN (she refuses Pulmicort as she says it made her sleepwalk and fall)...   ~  baseline CXR w/ incr markings, NAD... ~  prev PFT's w/ mod restrictive dis...  s/p participation in pulm rehab program...  ~  CTChest in 1999 showed mild fibrosis, some bronchiectasis in LL's... ~  last admitted 3/07 w/ AB exac superimposed on her underlying stable pulm fibrosis.Marland Kitchen. ~  f/u CXR 5/10 = mild basilar reticular interstitial dis/ scarring, no change. ~  f/u CXR 1/11 showed sl decr LVs & incr markings (?sl progression), tort Ao, etc... ~  f/u CXR 11/11 showed incr markings, no acute change... ~  CT Chest 11/11 showed incr markings at bases, 5mm RLL nodule noted, no adenopathy, & dilated hepatic ducts==> pancreatic cancer diagnosed. ~  CXR 8/12 showed stable incr markings c/w her underlying pulm fibrosis, no change... ~  CXR & CT Chest 03/22/11 showed low lung vol, scarring/ fibrosis, ?vague nodular areas on CT that radiologist said could not r/o mets.  ADENOCARCINOMA, PANCREAS (ICD-157.9)   <<SEE ABOVE>> new diagnosis 11/11 ~  HOSP DCSummary 02/22/10 w/ Tx to [facility] reviewed... ~  subseq follow up & management by DrOdogwu  in [place] & DrONeil at [facility]... ~  8/12: she reports that when last imaged they could not see the mass & they continue to follow scans every 4-34mo... ~  10/12:  Note from DrOdogwu reviewed> basilic vein thrombosis & they plan short-term anticoag; she is felt to have stable dis- CT scans done at [facility]... ~  1/13:  SEE ABOVE >> new abn LFTs, abn scans, etc... Needs assessment to determine if this is rapid progression & spread of her pancreatic neoplasm...  DEGENERATIVE JOINT DISEASE (ICD-715.90) & BACK PAIN, LUMBAR (ICD-724.2) - w/ hx spinal stenosis and prev eval by DrWillis... daughter prev requested outpt physical therapy w/ hot pack and stimulation therapy... she takes Glucosamine/ Chondroitin daily & Tylenol as needed... notes right knee pain & needs ortho eval... saw DrLucey, then DrDeveshwar- w/ Dx of seroneg RA >>  SERONEGATIVE RHEUMATOID ARTHRITIS (ICD-714.0) - diagnosed by DrDeveshwar and treated w/ PREDNISONE tapering schedule that she was confused about, & PLAQUENIL 200mg Bid... she switched to [facility] & was changed to Pred, MTX, & Folic Acid... ~  May10: right hand pain/ swelling worse since she stopped cortisone therapy per DrD... we re-started Medrol 8mg /d w/ improvement. ~  labs here 5/10 showed Chem-OK, CBC-12.4, Uric-4.3, Sed-18... ~  6/10 saw DrAnderson w/ change in regimen to PRED 5mg tabs- 3/d now, MTX 2.5mg - 6tabs/wk, Folic 1mg /d... ~  7/11: she is currently taking Pred5mg - 1.5tabs + MTX 6tabs/wk... ~  8/12:  She is currently off the MTX from [facility] (?stopped 11/11), and taking Pred 5mg /d & stable she says...  OSTEOPENIA (ICD-733.90) - she takes Calcium supplement, MVI, Vit D 1000 u daily & Glucosamine... ~  BMD in 2003 was OK w/ TScores > -1...  ~  f/u BMD 11/09 showed TScores +1.5 in Spine, & -0.9 in right FemNeck... rec- continue calcium/ vits. ~  Vit D level 11/09 = 33... encouraged to continue OTC Vit D supplements. ~  labs 5/10 showed Vit D level = 39... rec> 1000 u  daily.  ARTERIOSCLEROTIC DEMENTIA (ICD-290.40) - new prob per family in 2010... w/ MMSE showing 0/3 objects at 2 min after distraction... on ASA 81mg /d & ARICEPT 5mg /d started- they feel that this is helping... ~  7/10:  reviewed med Rx & very important visitng nurse help w/ meds. ~  7/11:  they are content to continue the Aricept at 5mg  dose. ~  8/12:  ditto  Hx of PARESTHESIA (ICD-782.0) - prev eval by neuro in [place] in 2006 was neg...she takes MVI daily.  ANXIETY (ICD-300.00)  MULTIPLE  MYELOMA (ICD-203.00) - labs showing low IgM level & referred to Heme/Onc w/ eval DrOdogwu showing a "smoldering mult myeloma"- SPE did not show an MSpike, but IEP showed a monoclonal IgA lambda protein (light chains were neg).Marland KitchenMarland KitchenBone marrow showed hypercellular marrow w/ 20% plasma cells... decision made for observational protocol... ~  6/11:  note from [facility] reviewed> myeloma markers are stable & Q42mo observation to continue. ~  12/11:  NOTE> she was Dx w/ pancreatic cancer last month... ~  Labs followed by DrOdogwu, her hematologist/ oncologist...  Health Maintenance: ~  GI:  Seen by [facility] for GI 1/13 Hosp w/ colonoscopy showing mult polyps (not removed) + Hems... ~  GYN:   ~  Immunizations: she is rec to get the Flu shot yearly;  She had a PNEUMOVAX in 2007;  ?last Tetanus vaccine...   Past Surgical History  Procedure Date  . Appendectomy   . Abdominal hysterectomy   . Common dile duct stent 02/2010    for pancreatic cancer  . Colonoscopy 03/24/2011    Procedure: COLONOSCOPY;  Surgeon: Theda Belfast, MD;  Location: [facility];  Service: Endoscopy;  Laterality: N/A;    Outpatient Encounter Prescriptions as of 04/01/2011  Medication Sig Dispense Refill  . acetaminophen (TYLENOL) 325 MG tablet Take 325 mg by mouth every 6 (six) hours as needed.       . ALPRAZolam (XANAX) 0.5 MG tablet Take 0.5 mg by mouth as needed.       . B Complex Vitamins (VITAMIN B COMPLEX PO) daily       .  Cholecalciferol (VITAMIN D) 1000 UNITS capsule Take 1,000 Units by mouth daily.        Marland Kitchen dexlansoprazole (DEXILANT) 60 MG capsule Take 60 mg by mouth as needed.       . donepezil (ARICEPT) 5 MG tablet TAKE 1 TABLET EVERY DAY  90 tablet  1  . Ferrous Sulfate 83 MG TABS Take 1 tablet by mouth daily.        . furosemide (LASIX) 20 MG tablet Take 1 tablet by mouth every morning  30 tablet  5  . guaifenesin (MUCUS RELIEF) 400 MG TABS Take 400 mg by mouth daily.       Marland Kitchen loperamide (IMODIUM A-D) 2 MG tablet 1 every 4-6 hours as needed for watery diarrhea      . megestrol (MEGACE) 20 MG tablet TAKE 1 TABLET EVERY DAY  90 tablet  1  . Misc. Devices (WALKER) MISC Rolling walker with seat Dx 715.90 & 714.0  1 each  0  . Multiple Vitamin (MULTIVITAMIN) capsule Take 1 capsule by mouth daily. velocity       . ondansetron (ZOFRAN) 4 MG tablet Take 1 tablet (4 mg total) by mouth every 6 (six) hours as needed for nausea.  20 tablet  0  . predniSONE (DELTASONE) 5 MG tablet Take 1 tablet (5 mg total) by mouth daily.  30 tablet  11  . simethicone (MYLICON) 125 MG chewable tablet Chew 125 mg by mouth every 6 (six) hours as needed.        . vitamin E (VITAMIN E) 400 UNIT capsule Take 400 Units by  mouth daily.        . NEOMYCIN-POLYMYXIN-HC, OTIC, (CORTISPORIN) 1 % SOLN Place 3 drops into the right ear 3 (three) times daily.  1 Bottle  0  . polyethylene glycol powder (MIRALAX) powder Take 17 g by mouth daily as needed.  255 g  0   KDUR tabs Take 1 tab twice daily 60 5    Allergies  Allergen Reactions  . Bee Pollen Swelling    Current Medications, Allergies, Past Medical History, Past Surgical History, Family History, and Social History were reviewed in [employer] record.    Review of Systems        See HPI - all other systems neg except as noted... The patient complains of anorexia, weight gain from fluid, edema, decreased hearing, dyspnea on exertion, abdominal pain, muscle  weakness, and difficulty walking.  The patient denies fever, vision loss, hoarseness, chest pain, syncope, peripheral edema, prolonged cough, headaches, hemoptysis, melena, hematochezia, severe indigestion/heartburn, hematuria, incontinence, suspicious skin lesions, transient blindness, depression, unusual weight change, enlarged lymph nodes, and angioedema.   Objective:   Physical Exam     WD, WN, 78 y/o BF in NAD... weak & in wheelchair but can't get onto exam table for exam. Vital signs:  Reviewed... GENERAL:  Alert, pleasant & cooperative... HEENT:  /AT, EOM-wnl, PERRLA, EACs-clear, TMs-ok, NOSE-clear, THROAT-clear & wnl. NECK:  Supple w/ fairROM; no JVD; normal carotid impulses w/o bruits; no thyromegaly or nodules palpated; no lymphadenopathy. CHEST:  velcro rales about 1/3 way up, no wheezing, no rhonchi, no signs of consolidation... HEART:  Regular Rhythm; without murmurs/ rubs/ or gallops heard... ABDOMEN:  Soft & mild epig tender on palp; normal bowel sounds; no organomegaly or masses detected. EXT:  mild arthritic changes in hands; +arthritic changes in right knee & shoulder; no varicose veins, +venous insuffic, no edema. NEURO:  CN's intact; no focal neuro deficits... DERM:  No lesions noted; no rash etc...  RADIOLOGY DATA:  Reviewed in the EPIC EMR & discussed w/ the patient...  LABORATORY DATA:  Reviewed in the EPIC EMR & discussed w/ the patient...   Assessment & Plan:   Dyspnea/ Weakness>  Discussed w/ pt & her daughter: her dyspnea is multifactorial & so is the weakness- she needs visiting nurses, home PT/OT, & DME needs addressed (should have been done prior to last discharge)...  Edema>  She has mod pitting edema in feet/legs & hands/UE; we reviewed dietary sodium restriction & decided to inc the Lasix20 to Lasix40/s; K returns low at 3.1 & we will call in K20Bid...  Pancreatic Cancer>  Followed & managed by DrOdogwu in [place] & DrONeil at [facility];  Review of data from  recent [facility] 1/8 - 03/28/11 is outlined above;  In my opinion she needs rapid assessment to determine if LFTs & abn seen on scans represents rapidly progressive & metastatic pancreatic cancer so they can decide on additional therapy vs Hospice care...  Mult Myeloma>  Also followed by DrOdogwu & labs in [facility] are reviewed....  PULM FIBROSIS>  Clinically stable on her low dose 5mg  Pred daily, CXR stable fibrosis pattern, no change; Continue same for now in light of her other more serious medical issues & has is given HYCODAN for cough prn.  DJD>  She is followed by Elita Quick; in wheelchair today w/ mult issues...  Dementia>  She is holding her own according to the daughter,  They want to continue the lower dose of Aricept as is...   Patient's  Medications  New Prescriptions   No medications on file  Previous Medications   ACETAMINOPHEN (TYLENOL) 325 MG TABLET    Take 325 mg by mouth every 6 (six) hours as needed.    ALPRAZOLAM (XANAX) 0.5 MG TABLET    Take 0.5 mg by mouth as needed.    B COMPLEX VITAMINS (VITAMIN B COMPLEX PO)    daily    CHOLECALCIFEROL (VITAMIN D) 1000 UNITS CAPSULE    Take 1,000 Units by mouth daily.     DEXLANSOPRAZOLE (DEXILANT) 60 MG CAPSULE    Take 60 mg by mouth as needed.    DONEPEZIL (ARICEPT) 5 MG TABLET    TAKE 1 TABLET EVERY DAY   FERROUS SULFATE 83 MG TABS    Take 1 tablet by mouth daily.     GUAIFENESIN (MUCUS RELIEF) 400 MG TABS    Take 400 mg by mouth daily.    LOPERAMIDE (IMODIUM A-D) 2 MG TABLET    1 every 4-6 hours as needed for watery diarrhea   MEGESTROL (MEGACE) 20 MG TABLET    TAKE 1 TABLET EVERY DAY   MISC. DEVICES (WALKER) MISC    Rolling walker with seat Dx 715.90 & 714.0   MULTIPLE VITAMIN (MULTIVITAMIN) CAPSULE    Take 1 capsule by mouth daily. velocity    NEOMYCIN-POLYMYXIN-HC, OTIC, (CORTISPORIN) 1 % SOLN    Place 3 drops into the right ear 3 (three) times daily.   ONDANSETRON (ZOFRAN) 4 MG TABLET    Take 1 tablet (4 mg total) by mouth  every 6 (six) hours as needed for nausea.   POLYETHYLENE GLYCOL POWDER (MIRALAX) POWDER    Take 17 g by mouth daily as needed.   PREDNISONE (DELTASONE) 5 MG TABLET    Take 1 tablet (5 mg total) by mouth daily.   SIMETHICONE (MYLICON) 125 MG CHEWABLE TABLET    Chew 125 mg by mouth every 6 (six) hours as needed.     VITAMIN E (VITAMIN E) 400 UNIT CAPSULE    Take 400 Units by mouth daily.    Modified Medications   Modified Medication Previous Medication   FUROSEMIDE (LASIX) 20 MG TABLET furosemide (LASIX) 20 MG tablet      Take 2 tablets by mouth every morning    Take 1 tablet (20 mg total) by mouth daily.  Discontinued Medications   FISH OIL-OMEGA-3 FATTY ACIDS 1000 MG CAPSULE    Take 2 g by mouth daily.     FUROSEMIDE (LASIX) 20 MG TABLET    Take 2 tablets by mouth every morning   PROBIOTIC PRODUCT (ALIGN) 4 MG CAPS    Take 1 capsule by mouth daily.   KCl po Bid called to [facility] on [address].Marland KitchenMarland Kitchen

## 2011-04-06 ENCOUNTER — Telehealth: Payer: Self-pay | Admitting: Pulmonary Disease

## 2011-04-06 NOTE — Telephone Encounter (Signed)
I spoke with  Heather Dorsey and she states since 8:00 this morning pt has had severe stomach cramping when she eats. I advised her to call pt's GI if she is having stomach issues. She states she will call dr. Elnoria Howard and see what they tell her. Nothing further was needed

## 2011-04-07 ENCOUNTER — Telehealth: Payer: Self-pay | Admitting: Pulmonary Disease

## 2011-04-07 ENCOUNTER — Ambulatory Visit (HOSPITAL_BASED_OUTPATIENT_CLINIC_OR_DEPARTMENT_OTHER): Payer: Medicare Other | Admitting: Oncology

## 2011-04-07 VITALS — BP 119/77 | HR 99 | Temp 98.1°F | Ht 64.0 in | Wt 158.8 lb

## 2011-04-07 DIAGNOSIS — C259 Malignant neoplasm of pancreas, unspecified: Secondary | ICD-10-CM

## 2011-04-07 DIAGNOSIS — R109 Unspecified abdominal pain: Secondary | ICD-10-CM

## 2011-04-07 DIAGNOSIS — D649 Anemia, unspecified: Secondary | ICD-10-CM

## 2011-04-07 NOTE — Telephone Encounter (Signed)
lmomtcb x1 for stephanie--okay for verbal order to extend nurse visits

## 2011-04-07 NOTE — Telephone Encounter (Signed)
I spoke with Heather Dorsey is aware. Nothing further was needed

## 2011-04-08 ENCOUNTER — Ambulatory Visit: Payer: Medicare Other | Admitting: Nutrition

## 2011-04-08 NOTE — Assessment & Plan Note (Signed)
I received a phone call from nursing asking me to please call Ms. Basford's daughter, [place] Sink.  I have worked with Ms. Bjorkman in the past. Her last visit was approximately 11 months ago in February.  This is an 78 year old female patient diagnosed with pancreatic cancer.  She is now a patient of Dr. Truett Perna.  MEDICAL HISTORY INCLUDES: 1. Biliary stent. 2. Chemotherapy. 3. DVT. 4. Multiple myeloma. 5. Chronic anemia. 6. Rheumatoid arthritis.  MEDICATIONS INCLUDE: 1. Vitamin D. 2. Lasix. 3. Megace. 4. K-Dur. 5. Prednisone. 6. Xanax. 7. Vitamin B. 8. Iron. 9. Imodium. 10.Multivitamin. 11.Vitamin E. 12.MiraLAX.  LABS INCLUDE:  Potassium of 3.1, glucose of 104.  HEIGHT:  64 inches. WEIGHT:  158 pounds. USUAL BODY WEIGHT:  200 pounds. BMI:  27.24.  I spoke with Ernie Avena on the phone regarding her mother.  The patient's daughter expressed a lot of frustration with the care her mother has received.  She is addressing this with different healthcare providers in the system.  Currently, she is requesting my help with what to feed her mother, primarily today.  Apparently, her mother had some vomiting yesterday and [place] Sink reports continued intolerance of Ensure or Glucerna, both products cause her mother to have diarrhea.  After her mother drank these products, her mother experienced diarrhea and [place] Sink gave her mother Imodium.  She then reports that her mother had some vomiting.  She is concerned of what to feed her today.  Per patient's daughter, her mother is no longer vomiting.  She has tolerated a Boost Plus today and her daughter, Iatan Sink, is asking my advice as to what to give her to eat today.  NUTRITION DIAGNOSIS:  Food and nutrition related knowledge deficit related to diagnosis of pancreatic cancer and associated treatments as evidenced by no prior need for nutrition related information.  INTERVENTION:  I have provided support and encouragement to the patient's daughter.  I have  recommended that the patient be given primarily a clear liquid diet or some bland foods, such as toast, until the patient's daughter can establish that mother is tolerating oral intake.  I have recommended tea and apple juice as well as a some toast or saltine crackers and, of course, Boost Plus, which her mother is tolerating.  I have educated the patient's daughter to slowly increase oral diet as tolerance is established to include chicken soup, soft cooked vegetables, and bland fruits based on tolerance.  The patient's daughter has requested additional coupons for supplements, so I will put those in the mail to her today.  The patient's daughter expressed appreciation for the phone call.  She did not wish to make a followup at this time.  MONITORING/EVALUATION (GOALS):  The patient will tolerate advancement of her oral diet.  NEXT VISIT:  The patient's daughter will call if nutrition followup is warranted.    ______________________________ Zenovia Jarred, RD, LDN Clinical Nutrition Specialist BN/MEDQ  D:  04/08/2011  T:  04/08/2011  Job:  [id number]

## 2011-04-08 NOTE — Progress Notes (Addendum)
OFFICE PROGRESS NOTE   INTERVAL HISTORY:   She has a history of pancreas cancer diagnosed in late 2011 when she underwent placement of a biliary stent. She has been followed by Dr. Dalene Carrow and Dr. Carmon Ginsberg at [facility]. She was initially treated with gemcitabine chemotherapy for approximately 2 months ending in March of 2012. There was CT scan in improvement in the pancreas mass following gemcitabine.  She has been maintained off of therapy for pancreas cancer since March of 2012. She has multiple comorbid conditions including chronic pulmonary disease, and upper extremity superficial venous thrombosis, anemia, and smoldering multiple myeloma.  She developed acute cramping abdominal pain and rectal bleeding on 03/22/2011. She was admitted to [facility]. Coumadin anticoagulation was discontinued. Dr. Elnoria Howard performed a colonoscopy which confirmed polyps, but no apparent bleeding site. The bleeding resolved and has not recurred.  The patient and her daughter have requested a transfer of her medical oncology care. She was discharged from the hospital to a nursing facility. Her daughter has taken her home. She is very "weak". She fell out of bed yesterday evening and slept on the floor. She denies bleeding. Her daughter has noted frequent "gas "sounds from the abdomen.  Objective:  Vital signs in last 24 hours:  Blood pressure 119/77, pulse 99, temperature 98.1 F (36.7 C), temperature source Oral, height 5\' 4"  (1.626 m), weight 158 lb 12.8 oz (72.031 kg).    HEENT: No thrush or ulcers Lymphatics: No cervical, supraclavicular, axillary, or inguinal lymph nodes Resp: Inspiratory rails at the lower posterior chest bilaterally. No respiratory distress. Cardio: Regular rate and rhythm GI: The abdomen is nontender. No hepatomegaly, no mass, no apparent ascites. Vascular: Trace pedal edema bilaterally. There is also trace pitting edema at the lower arm bilaterally. Neuro: Alert and oriented. The motor  examination appears grossly intact. There is 4/5 strength at the proximal legs. She was able to stand and walk a few steps with assistance.  Skin: No rash   Portacath/PICC-without erythema  Lab Results:  Lab Results  Component Value Date   WBC 8.9 04/01/2011   HGB 10.2* 04/01/2011   HCT 30.1* 04/01/2011   MCV 93.1 04/01/2011   PLT 197.0 04/01/2011   x-rays: CT abdomen and pelvis on 03/22/2011-lytic and sclerotic bone lesions suspicious for metastatic disease. Vague nodular lung lesions, metastatic disease cannot be excluded the large low attenuation area in the medial segment of the left hepatic lobe, not present on the prior exam. Gallbladder is markedly distended without wall thickening or gallstones. The pancreatic head demonstrates low-attenuation without a discrete mass. The tail and body are atrophied. Ill-defined soft tissue surrounds the right side of the celiac axis, felt to likely be tumor. Diffuse inflammation of the colon most consistent with C. difficile colitis. Heterogenous appearance of the bone marrow the   Medications: I have reviewed the patient's current medications.  Assessment/Plan:  1. Pancreas cancer-locally advanced, currently maintained off of specific therapy 2. History of biliary obstruction secondary to pancreas cancer, status post bile duct stent placement 3. Admission with rectal bleeding January 2013, resolved, Coumadin anticoagulation has been discontinued 4. History of an upper extremity venous thrombus 5. History of multiple myeloma 6. History of rheumatoid arthritis 7. Abdominal pain-likely related to pancreas cancer 8. Chronic anemia   Disposition:  She has a history of pancreas cancer and multiple comorbid conditions. She appears weak and now has a poor performance status. It is not clear whether the decline in her performance status is related to pancreas cancer  or one of her multiple medical conditions. The restaging CT evaluation during a recent  hospital admission does not reveal clear evidence of progressive pancreas cancer, though suspicious bone lesions were noted. These could be related to multiple myeloma. I plan to review the January CT scan with a radiologist and we will ask for a direct comparison to the most recent [facility] study.  I agree with discontinuing Coumadin anticoagulation. She plans to followup with Dr. Elnoria Howard to discuss the allergy of the elevated liver enzymes from the recent hospital admission.  We discussed treatment options for pancreas cancer. I doubt she will be a candidate for additional systemic therapy. I will recommend a supportive care approach with a hospice referral if progressive disease is confirmed.  She plans to continue followup with Dr. Kriste Basque for her multiple medical conditions. She is scheduled to see Dr. Carmon Ginsberg on 04/15/2011. She will return drops visit here on 04/25/2011.  Her daughter knows to contact us in the interim as needed.  60 minutes were spent with the patient and her daughter today. Greater than 50% of the time was spent in counseling and reviewing data. Lucile Shutters, MD  04/08/2011  8:43 AM

## 2011-04-09 ENCOUNTER — Inpatient Hospital Stay (HOSPITAL_COMMUNITY)
Admission: EM | Admit: 2011-04-09 | Discharge: 2011-04-16 | DRG: 689 | Disposition: A | Discharge status: Other Acute Inpatient Hospital | Payer: Medicare Other | Attending: Internal Medicine | Admitting: Internal Medicine

## 2011-04-09 ENCOUNTER — Inpatient Hospital Stay (HOSPITAL_COMMUNITY): Payer: Medicare Other

## 2011-04-09 ENCOUNTER — Telehealth: Payer: Self-pay | Admitting: Nurse Practitioner

## 2011-04-09 ENCOUNTER — Encounter (HOSPITAL_COMMUNITY): Payer: Self-pay | Admitting: Emergency Medicine

## 2011-04-09 ENCOUNTER — Emergency Department (HOSPITAL_COMMUNITY): Payer: Medicare Other

## 2011-04-09 ENCOUNTER — Other Ambulatory Visit: Payer: Self-pay

## 2011-04-09 ENCOUNTER — Telehealth: Payer: Self-pay | Admitting: Pulmonary Disease

## 2011-04-09 DIAGNOSIS — H269 Unspecified cataract: Secondary | ICD-10-CM

## 2011-04-09 DIAGNOSIS — R062 Wheezing: Secondary | ICD-10-CM | POA: Diagnosis not present

## 2011-04-09 DIAGNOSIS — D472 Monoclonal gammopathy: Secondary | ICD-10-CM | POA: Diagnosis present

## 2011-04-09 DIAGNOSIS — F29 Unspecified psychosis not due to a substance or known physiological condition: Secondary | ICD-10-CM | POA: Diagnosis present

## 2011-04-09 DIAGNOSIS — N390 Urinary tract infection, site not specified: Principal | ICD-10-CM | POA: Diagnosis present

## 2011-04-09 DIAGNOSIS — R197 Diarrhea, unspecified: Secondary | ICD-10-CM | POA: Diagnosis present

## 2011-04-09 DIAGNOSIS — J8410 Pulmonary fibrosis, unspecified: Secondary | ICD-10-CM

## 2011-04-09 DIAGNOSIS — G9341 Metabolic encephalopathy: Secondary | ICD-10-CM | POA: Diagnosis present

## 2011-04-09 DIAGNOSIS — J40 Bronchitis, not specified as acute or chronic: Secondary | ICD-10-CM | POA: Diagnosis present

## 2011-04-09 DIAGNOSIS — C259 Malignant neoplasm of pancreas, unspecified: Secondary | ICD-10-CM | POA: Diagnosis present

## 2011-04-09 DIAGNOSIS — J189 Pneumonia, unspecified organism: Secondary | ICD-10-CM | POA: Diagnosis present

## 2011-04-09 DIAGNOSIS — R112 Nausea with vomiting, unspecified: Secondary | ICD-10-CM | POA: Diagnosis present

## 2011-04-09 DIAGNOSIS — E871 Hypo-osmolality and hyponatremia: Secondary | ICD-10-CM | POA: Diagnosis present

## 2011-04-09 DIAGNOSIS — M069 Rheumatoid arthritis, unspecified: Secondary | ICD-10-CM | POA: Diagnosis present

## 2011-04-09 DIAGNOSIS — R209 Unspecified disturbances of skin sensation: Secondary | ICD-10-CM

## 2011-04-09 DIAGNOSIS — Z9221 Personal history of antineoplastic chemotherapy: Secondary | ICD-10-CM

## 2011-04-09 DIAGNOSIS — M545 Low back pain: Secondary | ICD-10-CM

## 2011-04-09 DIAGNOSIS — M949 Disorder of cartilage, unspecified: Secondary | ICD-10-CM | POA: Diagnosis present

## 2011-04-09 DIAGNOSIS — R945 Abnormal results of liver function studies: Secondary | ICD-10-CM

## 2011-04-09 DIAGNOSIS — K922 Gastrointestinal hemorrhage, unspecified: Secondary | ICD-10-CM

## 2011-04-09 DIAGNOSIS — Z66 Do not resuscitate: Secondary | ICD-10-CM | POA: Diagnosis present

## 2011-04-09 DIAGNOSIS — D638 Anemia in other chronic diseases classified elsewhere: Secondary | ICD-10-CM | POA: Diagnosis present

## 2011-04-09 DIAGNOSIS — F411 Generalized anxiety disorder: Secondary | ICD-10-CM

## 2011-04-09 DIAGNOSIS — J42 Unspecified chronic bronchitis: Secondary | ICD-10-CM

## 2011-04-09 DIAGNOSIS — R220 Localized swelling, mass and lump, head: Secondary | ICD-10-CM

## 2011-04-09 DIAGNOSIS — F068 Other specified mental disorders due to known physiological condition: Secondary | ICD-10-CM | POA: Diagnosis present

## 2011-04-09 DIAGNOSIS — C9000 Multiple myeloma not having achieved remission: Secondary | ICD-10-CM

## 2011-04-09 DIAGNOSIS — G893 Neoplasm related pain (acute) (chronic): Secondary | ICD-10-CM | POA: Diagnosis present

## 2011-04-09 DIAGNOSIS — J309 Allergic rhinitis, unspecified: Secondary | ICD-10-CM

## 2011-04-09 DIAGNOSIS — R7989 Other specified abnormal findings of blood chemistry: Secondary | ICD-10-CM | POA: Diagnosis present

## 2011-04-09 DIAGNOSIS — C787 Secondary malignant neoplasm of liver and intrahepatic bile duct: Secondary | ICD-10-CM | POA: Diagnosis present

## 2011-04-09 DIAGNOSIS — J90 Pleural effusion, not elsewhere classified: Secondary | ICD-10-CM | POA: Diagnosis present

## 2011-04-09 DIAGNOSIS — K831 Obstruction of bile duct: Secondary | ICD-10-CM | POA: Diagnosis present

## 2011-04-09 DIAGNOSIS — R188 Other ascites: Secondary | ICD-10-CM | POA: Diagnosis present

## 2011-04-09 DIAGNOSIS — Z86718 Personal history of other venous thrombosis and embolism: Secondary | ICD-10-CM

## 2011-04-09 DIAGNOSIS — B961 Klebsiella pneumoniae [K. pneumoniae] as the cause of diseases classified elsewhere: Secondary | ICD-10-CM | POA: Diagnosis present

## 2011-04-09 DIAGNOSIS — F0150 Vascular dementia without behavioral disturbance: Secondary | ICD-10-CM

## 2011-04-09 DIAGNOSIS — I82629 Acute embolism and thrombosis of deep veins of unspecified upper extremity: Secondary | ICD-10-CM

## 2011-04-09 DIAGNOSIS — E876 Hypokalemia: Secondary | ICD-10-CM | POA: Diagnosis present

## 2011-04-09 DIAGNOSIS — K659 Peritonitis, unspecified: Secondary | ICD-10-CM | POA: Diagnosis present

## 2011-04-09 DIAGNOSIS — Z7901 Long term (current) use of anticoagulants: Secondary | ICD-10-CM

## 2011-04-09 DIAGNOSIS — A498 Other bacterial infections of unspecified site: Secondary | ICD-10-CM | POA: Diagnosis present

## 2011-04-09 DIAGNOSIS — K219 Gastro-esophageal reflux disease without esophagitis: Secondary | ICD-10-CM | POA: Diagnosis present

## 2011-04-09 DIAGNOSIS — M1990 Unspecified osteoarthritis, unspecified site: Secondary | ICD-10-CM

## 2011-04-09 DIAGNOSIS — M899 Disorder of bone, unspecified: Secondary | ICD-10-CM | POA: Diagnosis present

## 2011-04-09 LAB — URINE MICROSCOPIC-ADD ON

## 2011-04-09 LAB — DIFFERENTIAL
Basophils Relative: 0 % (ref 0–1)
Eosinophils Absolute: 0 10*3/uL (ref 0.0–0.7)
Lymphs Abs: 0.1 10*3/uL — ABNORMAL LOW (ref 0.7–4.0)
Monocytes Absolute: 0.6 10*3/uL (ref 0.1–1.0)
Neutro Abs: 10.3 10*3/uL — ABNORMAL HIGH (ref 1.7–7.7)

## 2011-04-09 LAB — CBC
HCT: 29.2 % — ABNORMAL LOW (ref 36.0–46.0)
Hemoglobin: 10.2 g/dL — ABNORMAL LOW (ref 12.0–15.0)
MCH: 30.5 pg (ref 26.0–34.0)
MCHC: 34.9 g/dL (ref 30.0–36.0)

## 2011-04-09 LAB — CARDIAC PANEL(CRET KIN+CKTOT+MB+TROPI)
CK, MB: 2.1 ng/mL (ref 0.3–4.0)
Troponin I: <0.3 ng/mL (ref <0.30)

## 2011-04-09 LAB — COMPREHENSIVE METABOLIC PANEL
Alkaline Phosphatase: 271 U/L — ABNORMAL HIGH (ref 39–117)
BUN: 20 mg/dL (ref 6–23)
Calcium: 9.6 mg/dL (ref 8.4–10.5)
GFR calc Af Amer: 89 mL/min — ABNORMAL LOW (ref >90)
Glucose, Bld: 175 mg/dL — ABNORMAL HIGH (ref 70–99)
Total Protein: 7.2 g/dL (ref 6.0–8.3)

## 2011-04-09 LAB — URINALYSIS, ROUTINE W REFLEX MICROSCOPIC
Glucose, UA: NEGATIVE mg/dL
Protein, ur: 30 mg/dL — AB

## 2011-04-09 LAB — LIPASE, BLOOD: Lipase: 11 U/L (ref 11–59)

## 2011-04-09 MED ORDER — ACETAMINOPHEN 650 MG RE SUPP: 650.0000 mg | Freq: Four times a day (QID) | RECTAL | Status: DC | PRN

## 2011-04-09 MED ORDER — SODIUM CHLORIDE 0.9 % IV BOLUS (SEPSIS)
500.0000 mL | Freq: Once | INTRAVENOUS | Status: AC
  Administered 2011-04-09: 500 mL via INTRAVENOUS

## 2011-04-09 MED ORDER — HYDROCODONE-ACETAMINOPHEN 5-325 MG PO TABS
1.0000 | ORAL_TABLET | ORAL | Status: DC | PRN
  Administered 2011-04-13: 2 via ORAL
  Administered 2011-04-13: 1 via ORAL
  Administered 2011-04-14: 2 via ORAL
  Filled 2011-04-09: qty 1
  Filled 2011-04-09 (×2): qty 2

## 2011-04-09 MED ORDER — ACETAMINOPHEN 325 MG PO TABS: 650.0000 mg | ORAL_TABLET | Freq: Four times a day (QID) | ORAL | Status: DC | PRN

## 2011-04-09 MED ORDER — SIMETHICONE 80 MG PO CHEW
80.0000 mg | CHEWABLE_TABLET | Freq: Four times a day (QID) | ORAL | Status: DC | PRN
  Administered 2011-04-09: 80 mg via ORAL
  Filled 2011-04-09: qty 1

## 2011-04-09 MED ORDER — DONEPEZIL HCL 5 MG PO TABS
5.0000 mg | ORAL_TABLET | Freq: Every day | ORAL | Status: DC
  Administered 2011-04-09 – 2011-04-14 (×6): 5 mg via ORAL
  Filled 2011-04-09 (×8): qty 1

## 2011-04-09 MED ORDER — DEXTROSE 5 % IV SOLN
1.0000 g | INTRAVENOUS | Status: DC
  Administered 2011-04-10: 1 g via INTRAVENOUS
  Filled 2011-04-09: qty 10

## 2011-04-09 MED ORDER — SODIUM CHLORIDE 0.9 % IV SOLN: INTRAVENOUS | Status: DC

## 2011-04-09 MED ORDER — DEXTROSE 5 % IV SOLN
1.0000 g | Freq: Once | INTRAVENOUS | Status: AC
  Administered 2011-04-09: 1 g via INTRAVENOUS
  Filled 2011-04-09: qty 10

## 2011-04-09 MED ORDER — PREDNISONE 5 MG PO TABS
5.0000 mg | ORAL_TABLET | Freq: Every day | ORAL | Status: DC
  Administered 2011-04-10 – 2011-04-14 (×5): 5 mg via ORAL
  Filled 2011-04-09 (×6): qty 1

## 2011-04-09 MED ORDER — ALUM & MAG HYDROXIDE-SIMETH 200-200-20 MG/5ML PO SUSP
30.0000 mL | Freq: Four times a day (QID) | ORAL | Status: DC | PRN
  Administered 2011-04-11 – 2011-04-16 (×7): 30 mL via ORAL
  Filled 2011-04-09 (×7): qty 30

## 2011-04-09 MED ORDER — ONDANSETRON HCL 4 MG PO TABS
4.0000 mg | ORAL_TABLET | Freq: Four times a day (QID) | ORAL | Status: DC | PRN
  Administered 2011-04-10: 4 mg via ORAL

## 2011-04-09 MED ORDER — SODIUM CHLORIDE 0.9 % IV SOLN
INTRAVENOUS | Status: DC
  Administered 2011-04-09 – 2011-04-12 (×5): via INTRAVENOUS

## 2011-04-09 MED ORDER — MEGESTROL ACETATE 20 MG PO TABS
20.0000 mg | ORAL_TABLET | Freq: Every day | ORAL | Status: DC
  Administered 2011-04-09 – 2011-04-11 (×3): 20 mg via ORAL
  Filled 2011-04-09 (×5): qty 1

## 2011-04-09 MED ORDER — PANTOPRAZOLE SODIUM 40 MG PO TBEC
40.0000 mg | DELAYED_RELEASE_TABLET | Freq: Every day | ORAL | Status: DC
  Administered 2011-04-10: 40 mg via ORAL
  Filled 2011-04-09 (×3): qty 1

## 2011-04-09 MED ORDER — ONDANSETRON HCL 4 MG/2ML IJ SOLN
4.0000 mg | Freq: Four times a day (QID) | INTRAMUSCULAR | Status: DC | PRN
  Administered 2011-04-10 – 2011-04-16 (×10): 4 mg via INTRAVENOUS
  Filled 2011-04-09 (×11): qty 2

## 2011-04-09 MED ORDER — SODIUM CHLORIDE 0.9 % IV SOLN: Freq: Once | INTRAVENOUS | Status: DC

## 2011-04-09 NOTE — ED Provider Notes (Signed)
History     CSN: [id number]  Arrival date & time 04/09/11  1154   First MD Initiated Contact with Patient 04/09/11 1213      No chief complaint on file.   (Consider location/radiation/quality/duration/timing/severity/associated sxs/prior treatment) HPI Comments: Patient presents here today with her daughter who gives the majority of the history due to the patient's weakened condition and hoarseness of her voice.  The daughter notes that her mother started having increased difficulty breathing last night.  She states that her mother spent the night sitting upright to sleep because she could not feel comfortable lying down.  She did give her mother some hydrocodone cough syrup which helped her sleep soundly until this morning.  Patient has had some mild increased cough since last night but no fevers at home.  The daughter also notes some increased generalized swelling in this patient.  She notes that her mother has had some swelling in a generalized distribution earlier this week which have improved but then has returned in bilateral lower extremities and in her arms.  She also notes that her mother had increased nausea and vomiting last night with persistent upper abdominal pain that is not changed from several weeks ago.  There is no bloody or coffee ground emesis per the report.  No bloody or black stools.  Patient is still having normal bowel movements.  The daughter also notes that she was referred in by multiple doctors who she spoke with on the phone this morning including the on-call GI physician and the oncologist on call.  The daughter does express some frustration because her mother is unable to walk since her recent admission to the hospital this month and the daughter has difficulty caring for her at home due to her mother's inability to walk.  Patient is a 78 y.o. female presenting with shortness of breath. The history is provided by the patient and a relative. The history is limited by  the condition of the patient. No language interpreter was used.  Shortness of Breath  The current episode started yesterday. The problem occurs continuously. The problem has been gradually worsening. The problem is moderate. The symptoms are relieved by nothing. The symptoms are aggravated by nothing. Associated symptoms include chest pain, orthopnea, cough and shortness of breath. Pertinent negatives include no chest pressure, no fever, no rhinorrhea, no sore throat and no stridor. There was no intake of a foreign body. She has had prior hospitalizations.    Past Medical History  Diagnosis Date  . Allergic rhinitis   . Bronchitis, mucopurulent recurrent   . Pulmonary fibrosis   . DJD (degenerative joint disease)   . Lumbar back pain   . Seronegative rheumatoid arthritis   . Osteopenia   . Arteriosclerotic dementia   . Paresthesia   . Multiple myeloma   . Anxiety   . Adenocarcinoma of pancreas   . PONV (postoperative nausea and vomiting)     Past Surgical History  Procedure Date  . Appendectomy   . Abdominal hysterectomy   . Common dile duct stent 02/2010    for pancreatic cancer  . Colonoscopy 03/24/2011    Procedure: COLONOSCOPY;  Surgeon: Theda Belfast, MD;  Location: [facility];  Service: Endoscopy;  Laterality: N/A;    History reviewed. No pertinent family history.  History  Substance Use Topics  . Smoking status: Former Smoker -- 1 years    Types: Cigarettes    Quit date: 03/14/1945  . Smokeless tobacco: Never Used   Comment:  smoked as a teenager  . Alcohol Use: No    OB History    Grav Para Term Preterm Abortions TAB SAB Ect Mult Living                  Review of Systems  Constitutional: Positive for appetite change. Negative for fever and chills.  HENT: Positive for voice change. Negative for sore throat and rhinorrhea.   Eyes: Negative.  Negative for discharge and redness.  Respiratory: Positive for cough and shortness of breath. Negative for stridor.    Cardiovascular: Positive for chest pain, orthopnea and leg swelling.  Gastrointestinal: Positive for nausea, vomiting and abdominal pain. Negative for diarrhea.  Genitourinary: Negative.  Negative for dysuria and vaginal discharge.  Musculoskeletal: Negative.  Negative for back pain.  Skin: Negative.  Negative for color change and rash.  Neurological: Negative.  Negative for syncope and headaches.  Hematological: Negative.  Negative for adenopathy.  Psychiatric/Behavioral: Negative.  Negative for confusion.  All other systems reviewed and are negative.    Allergies  Bee pollen  Home Medications   Current Outpatient Rx  Name Route Sig Dispense Refill  . ACETAMINOPHEN 325 MG PO TABS Oral Take 325 mg by mouth every 6 (six) hours as needed.     . ALPRAZOLAM 0.5 MG PO TABS Oral Take 0.5 mg by mouth as needed.     Marland Kitchen VITAMIN B COMPLEX PO  daily     . VITAMIN D 1000 UNITS PO CAPS Oral Take 1,000 Units by mouth daily.      . DEXLANSOPRAZOLE 60 MG PO CPDR Oral Take 60 mg by mouth as needed.     . DONEPEZIL HCL 5 MG PO TABS  TAKE 1 TABLET EVERY DAY 90 tablet 1  . FERROUS SULFATE 83 MG PO TABS Oral Take 1 tablet by mouth daily.      . FUROSEMIDE 20 MG PO TABS  Take 2 tablets by mouth every morning 60 tablet 6  . GUAIFENESIN 400 MG PO TABS Oral Take 400 mg by mouth daily.     Marland Kitchen LOPERAMIDE HCL 2 MG PO TABS  1 every 4-6 hours as needed for watery diarrhea    . MEGESTROL ACETATE 20 MG PO TABS  TAKE 1 TABLET EVERY DAY 90 tablet 1  . WALKER MISC  Rolling walker with seat Dx 715.90 & 714.0 1 each 0  . MULTIVITAMINS PO CAPS Oral Take 1 capsule by mouth daily. velocity     . NEOMYCIN-POLYMYXIN-HC 1 % OT SOLN Right Ear Place 3 drops into the right ear 3 (three) times daily. 1 Bottle 0  . POLYETHYLENE GLYCOL 3350 PO POWD Oral Take 17 g by mouth daily as needed. 255 g 0  . POTASSIUM CHLORIDE CRYS ER 20 MEQ PO TBCR Oral Take 20 mEq by mouth 2 (two) times daily.    Marland Kitchen PREDNISONE 5 MG PO TABS Oral Take 1  tablet (5 mg total) by mouth daily. 30 tablet 11  . SIMETHICONE 125 MG PO CHEW Oral Chew 125 mg by mouth every 6 (six) hours as needed.      Marland Kitchen VITAMIN E 400 UNITS PO CAPS Oral Take 400 Units by mouth daily.        BP 126/69  Pulse 113  Temp(Src) 97.7 F (36.5 C) (Oral)  SpO2 100%  Physical Exam  Nursing note and vitals reviewed. Constitutional: She is oriented to person, place, and time. She appears well-developed and well-nourished.  Non-toxic appearance. She does not have a  sickly appearance.  HENT:  Head: Normocephalic and atraumatic.  Eyes: Conjunctivae, EOM and lids are normal. Pupils are equal, round, and reactive to light. No scleral icterus.  Neck: Trachea normal and normal range of motion. Neck supple.  Cardiovascular: Regular rhythm, S1 normal, S2 normal and normal heart sounds.  Tachycardia present.   Pulmonary/Chest: Effort normal and breath sounds normal. No respiratory distress. She has no wheezes. She has no rales.  Abdominal: Soft. Normal appearance. There is no tenderness. There is no rebound, no guarding and no CVA tenderness.  Musculoskeletal: Normal range of motion. She exhibits edema.       Patient has mild bilateral lower extremity edema and mild bilateral upper extremity edema as well.  Neurological: She is alert and oriented to person, place, and time. She has normal strength.  Skin: Skin is warm, dry and intact. No rash noted.  Psychiatric: She has a normal mood and affect. Her behavior is normal. Judgment and thought content normal.    ED Course  Procedures (including critical care time)  Results for orders placed during the hospital encounter of 04/09/11  CBC      Component Value Range   WBC 11.0 (*) 4.0 - 10.5 (K/uL)   RBC 3.34 (*) 3.87 - 5.11 (MIL/uL)   Hemoglobin 10.2 (*) 12.0 - 15.0 (g/dL)   HCT 38.7 (*) 56.4 - 46.0 (%)   MCV 87.4  78.0 - 100.0 (fL)   MCH 30.5  26.0 - 34.0 (pg)   MCHC 34.9  30.0 - 36.0 (g/dL)   RDW 33.2 (*) 95.1 - 15.5 (%)    Platelets 282  150 - 400 (K/uL)  DIFFERENTIAL      Component Value Range   Neutrophils Relative 94 (*) 43 - 77 (%)   Lymphocytes Relative 1 (*) 12 - 46 (%)   Monocytes Relative 5  3 - 12 (%)   Eosinophils Relative 0  0 - 5 (%)   Basophils Relative 0  0 - 1 (%)   Neutro Abs 10.3 (*) 1.7 - 7.7 (K/uL)   Lymphs Abs 0.1 (*) 0.7 - 4.0 (K/uL)   Monocytes Absolute 0.6  0.1 - 1.0 (K/uL)   Eosinophils Absolute 0.0  0.0 - 0.7 (K/uL)   Basophils Absolute 0.0  0.0 - 0.1 (K/uL)   RBC Morphology BURR CELLS    COMPREHENSIVE METABOLIC PANEL      Component Value Range   Sodium 131 (*) 135 - 145 (mEq/L)   Potassium 4.3  3.5 - 5.1 (mEq/L)   Chloride 97  96 - 112 (mEq/L)   CO2 16 (*) 19 - 32 (mEq/L)   Glucose, Bld 175 (*) 70 - 99 (mg/dL)   BUN 20  6 - 23 (mg/dL)   Creatinine, Ser 8.84  0.50 - 1.10 (mg/dL)   Calcium 9.6  8.4 - 16.6 (mg/dL)   Total Protein 7.2  6.0 - 8.3 (g/dL)   Albumin 2.8 (*) 3.5 - 5.2 (g/dL)   AST 28  0 - 37 (U/L)   ALT 24  0 - 35 (U/L)   Alkaline Phosphatase 271 (*) 39 - 117 (U/L)   Total Bilirubin 1.2  0.3 - 1.2 (mg/dL)   GFR calc non Af Amer 77 (*) >90 (mL/min)   GFR calc Af Amer 89 (*) >90 (mL/min)  LIPASE, BLOOD      Component Value Range   Lipase 11  11 - 59 (U/L)  CARDIAC PANEL(CRET KIN+CKTOT+MB+TROPI)      Component Value Range   Total CK  55  7 - 177 (U/L)   CK, MB 2.1  0.3 - 4.0 (ng/mL)   Troponin I <0.30  <0.30 (ng/mL)   Relative Index RELATIVE INDEX IS INVALID  0.0 - 2.5   PRO B NATRIURETIC PEPTIDE      Component Value Range   Pro B Natriuretic peptide (BNP) 5.0  0 - 450 (pg/mL)  URINALYSIS, ROUTINE W REFLEX MICROSCOPIC      Component Value Range   Color, Urine AMBER (*) YELLOW    APPearance CLOUDY (*) CLEAR    Specific Gravity, Urine 1.031 (*) 1.005 - 1.030    pH 5.5  5.0 - 8.0    Glucose, UA NEGATIVE  NEGATIVE (mg/dL)   Hgb urine dipstick TRACE (*) NEGATIVE    Bilirubin Urine NEGATIVE  NEGATIVE    Ketones, ur 15 (*) NEGATIVE (mg/dL)   Protein, ur 30  (*) NEGATIVE (mg/dL)   Urobilinogen, UA 1.0  0.0 - 1.0 (mg/dL)   Nitrite NEGATIVE  NEGATIVE    Leukocytes, UA MODERATE (*) NEGATIVE   URINE MICROSCOPIC-ADD ON      Component Value Range   Squamous Epithelial / LPF RARE  RARE    WBC, UA TOO NUMEROUS TO COUNT  <3 (WBC/hpf)   RBC / HPF 0-2  <3 (RBC/hpf)   Bacteria, UA MANY (*) RARE    Urine-Other MUCOUS PRESENT     Dg Chest 1 View  04/09/2011  *RADIOLOGY REPORT*  Clinical Data: Short of breath  CHEST - 1 VIEW  Comparison: Chest radiograph 03/22/2011  Findings: There is a power port in the right chest wall with tip in distal SVC.  Stable cardiac silhouette.  There are low lung volumes similar to prior.  No pulmonary edema.  Basilar atelectasis is present.  No focal infiltrate.  IMPRESSION:  1.  No significant change. 2.  Low lung volumes and basilar atelectasis.  Original Report Authenticated By: Genevive Bi, M.D.     Ct Abdomen Pelvis W Contrast  03/22/2011  *RADIOLOGY REPORT*  Clinical Data:  Shortness of breath and rectal bleeding.  CT ANGIOGRAPHY CHEST CT ABDOMEN AND PELVIS WITH CONTRAST  Technique:  Multidetector CT imaging of the chest was performed using the standard protocol during bolus administration of intravenous contrast.  Multiplanar CT image reconstructions including MIPs were obtained to evaluate the vascular anatomy. Multidetector CT imaging of the abdomen and pelvis was performed using the standard protocol during bolus administration of intravenous contrast.  Contrast: OMNIPAQUE IOHEXOL 300 MG/ML IV SOLN  Comparison:  CT chest 02/10/2010.  CTA CHEST  Findings:  The chest wall is unremarkable.  No breast masses, supraclavicular or axillary lymphadenopathy.  A right-sided Port-A- Cath is noted.  The bony thorax is intact.  Lytic and sclerotic appearing bone changes suspicious for metastatic disease.  The heart is normal in size.  No pericardial effusion.  There are scattered mediastinal and hilar lymph nodes but no adenopathy.   The esophagus is grossly normal.  The aorta is tortuous and mildly ectatic with atherosclerotic changes but no dissection or focal aneurysm.  Dense coronary artery calcifications are noted.  The pulmonary arterial tree is fairly well opacified.  No filling defects to suggest pulmonary emboli.  There is noted in the left brachiocephalic vein likely from the injection.  Examination of the lung parenchyma demonstrates pulmonary scarring and peribronchial thickening.  Vague nodular lung lesions.  Cannot exclude metastatic disease.  No edema or effusions.   Review of the MIP images confirms the above findings.  IMPRESSION:  1.  No CT findings for pulmonary embolism. 2.  No mediastinal or hilar adenopathy. 3.  Scattered ill-defined pulmonary lesions.  Pulmonary metastatic disease is possible.  Close CT follow-up is recommended.  CT ABDOMEN AND PELVIS  Findings: A biliary stent is in place.  There is associated intrahepatic biliary air.  There is a large low attenuation area in the medial segment of the left hepatic lobe and also in part the right hepatic lobe.   This was not present on the prior examination.  It could be a treated lesion.  Vessels and the biliary tree run through this area and severe focal fat is possible.  Comparison with any more recent CT scan may be helpful. The gallbladder is markedly distended and gallbladder folds are noted.  Cystic duct obstruction is possible although no definite wall thickening or gallstones.  The pancreatic head demonstrates diffuse low attenuation without discrete mass.  The pancreatic body and tail are quite atrophied.  There is ill-defined soft tissue density surrounding the right side of the celiac access which is likely tumor.  Scattered small lymph nodes are noted also.  The spleen is normal in size.  No focal lesions.  The adrenal glands and kidneys are unremarkable.  The stomach is grossly normal.  The duodenum and small bowel are unremarkable.  The colon demonstrates  severe and diffuse inflammation most consistent with C difficile colitis.  The surrounding inflammation and fluid.  The bladder has a Foley catheter in it.  Mild wall thickening. There is moderate free pelvic fluid.  No pelvic mass or adenopathy.  Very heterogeneous appearance of the bone marrow.  Cannot exclude metastatic disease.  Review of the MIP images confirms the above findings.  IMPRESSION:  1.  Large area of low attenuation in the liver could be treated disease or severe focal fat.  Vessels and the biliary tree run through this area so I do not think it is mass.  MRI may be helpful for further evaluation. 2.  Marked distention of the gallbladder. 3.  Ill-defined low attenuation of the pancreatic head with adjacent soft tissue tumor and small lymph nodes. 4.  Severe diffuse inflammation of the colon most consistent with C difficile colitis. 5.  Moderate amount of free pelvic fluid. 6.  Biliary stent in place.  Original Report Authenticated By: P. Loralie Champagne, M.D.       MDM  Patient presents with multiple different medical problems at this time.  Acutely patient does appear to have a urinary tract infection and given the patient's decreased ability to tolerate by mouth intake with nausea and vomiting I feel does warrant admission for IV antibiotics until the patient can tolerate by mouth more properly.  Patient secondarily also has some long-term care issues that may need to be addressed during this admission and this is been addressed with Dr. Irene Limbo the hospitalist who will be taking care of this patient.  The patient is currently unable to ambulate and is cared for at home by the daughter but the daughter is unable to comfortably provide for all of the patient's needs per her own report given her mother's immobility.  Patient's shortness of breath is not evident here as the patient has oxygen saturations of 100% and appears to be in no distress.  She has a normal lung exam.  Patient did  recently have a CT angiogram of her chest to rule out PE earlier this month which was negative and given patient's normal oxygen saturation,  lack of distress and no pleuritic chest pain PE seems less likely.  Patient does not have symptoms consistent with pneumonia at this time.  Patient's abdominal pain may be related to her possible pancreatic cancer.  Patient's decreased appetite could also be do to her pancreatic cancer and multiple myeloma as well.  It is likely this patient has multifactorial cause for her increasing generalized weakness and decreased appetite.        Nat Christen, MD 04/09/11 [phone]

## 2011-04-09 NOTE — H&P (Signed)
History and Physical  Heather Dorsey ZOX:[id number] DOB: Aug 01, 1923 DOA: 04/09/2011  Referring physician: Golda Acre, MD PCP: Michele Mcalpine, MD, MD  Oncologist: Mancel Bale, M.D., [name], MD at [facility] Gastroenterologist: Jeani Hawking, M.D.  Chief Complaint: Confusion  HPI:  78 year old woman with a history of pancreatic adenocarcinoma who presented emergency department today with her daughter. The patient is confused and all history obtained from her daughter. The patient was recently admitted to the hospital as outlined below. It has been difficult for her daughter to care for the patient at home although her daughter reports some strength and ability to ambulate has improved. She reports her mother has continued to have vomiting and some stomach complaints since discharge from the hospital. She often vomits up her medications. Her mother has become more confused in last night and sat up on the side of the bed all night complaining of stomach pain. In the outpatient setting she is also noted to have had anasarca. She was seen by her primary care physician as well as oncology as outlined below.   In the emergency department she was found to have urinary tract infection and confusion and was referred for admission.  Admitted January 8-14 for GI bleed. This occurred in the context of warfarin for a basilic vein DVT. She underwent colonoscopy that time which revealed multiple polyps. CT of the abdomen at that time revealed recurrent pancreatic cancer with some pulmonary nodules consistent with metastatic disease. Anticoagulation was discontinued as repeat imaging demonstrated resolution of DVT.  She was discharged to [facility] with her daughter immediately removed her from that facility. She had been receiving home health. Since her discharge from the hospital she has complained of anasarca. She was placed on Lasix without improvement. Saw primary care physician January 19. Saw Dr. Truett Perna January  25. At this point Dr. Truett Perna is continuing to evaluate for recurrent cancer.  Review of Systems:  Unobtainable from the patient. Otherwise as above.  Past Medical History  Diagnosis Date  . Allergic rhinitis   . Bronchitis, mucopurulent recurrent   . Pulmonary fibrosis   . DJD (degenerative joint disease)   . Lumbar back pain   . Seronegative rheumatoid arthritis   . Osteopenia   . Arteriosclerotic dementia   . Paresthesia   . Multiple myeloma   . Anxiety   . Adenocarcinoma of pancreas   . PONV (postoperative nausea and vomiting)    Past Surgical History  Procedure Date  . Appendectomy   . Abdominal hysterectomy   . Common dile duct stent 02/2010    for pancreatic cancer  . Colonoscopy 03/24/2011    Procedure: COLONOSCOPY;  Surgeon: Theda Belfast, MD;  Location: [facility];  Service: Endoscopy;  Laterality: N/A;   Social History:  reports that she quit smoking about 66 years ago. Her smoking use included Cigarettes. She quit after 1 year of use. She has never used smokeless tobacco. She reports that she does not drink alcohol or use illicit drugs.  Allergies  Allergen Reactions  . Bee Pollen Swelling    History reviewed. No pertinent family history.  Prior to Admission medications   Medication Sig Start Date End Date Taking? Authorizing Provider  acetaminophen (TYLENOL) 325 MG tablet Take 325 mg by mouth every 6 (six) hours as needed.     Historical Provider, MD  ALPRAZolam Prudy Feeler) 0.5 MG tablet Take 0.5 mg by mouth as needed.     Historical Provider, MD  B Complex Vitamins (VITAMIN B COMPLEX PO)  daily     Historical Provider, MD  Cholecalciferol (VITAMIN D) 1000 UNITS capsule Take 1,000 Units by mouth daily.      Historical Provider, MD  dexlansoprazole (DEXILANT) 60 MG capsule Take 60 mg by mouth as needed.     Historical Provider, MD  donepezil (ARICEPT) 5 MG tablet TAKE 1 TABLET EVERY DAY 12/26/10   Michele Mcalpine, MD  Ferrous Sulfate 83 MG TABS Take 1 tablet by  mouth daily.      Historical Provider, MD  furosemide (LASIX) 20 MG tablet Take 2 tablets by mouth every morning 04/01/11   Michele Mcalpine, MD  guaifenesin (MUCUS RELIEF) 400 MG TABS Take 400 mg by mouth daily.     Historical Provider, MD  loperamide (IMODIUM A-D) 2 MG tablet 1 every 4-6 hours as needed for watery diarrhea 03/30/11   Michele Mcalpine, MD  megestrol (MEGACE) 20 MG tablet TAKE 1 TABLET EVERY DAY 12/26/10   Michele Mcalpine, MD  Misc. Devices (WALKER) MISC Rolling walker with seat Dx 715.90 & 714.0 06/16/10   Michele Mcalpine, MD  Multiple Vitamin (MULTIVITAMIN) capsule Take 1 capsule by mouth daily. velocity     Historical Provider, MD  NEOMYCIN-POLYMYXIN-HC, OTIC, (CORTISPORIN) 1 % SOLN Place 3 drops into the right ear 3 (three) times daily. 07/23/10   Michele Mcalpine, MD  polyethylene glycol powder (MIRALAX) powder Take 17 g by mouth daily as needed. 03/26/11   Kathlen Mody, MD  potassium chloride SA (K-DUR,KLOR-CON) 20 MEQ tablet Take 20 mEq by mouth 2 (two) times daily.    Historical Provider, MD  predniSONE (DELTASONE) 5 MG tablet Take 1 tablet (5 mg total) by mouth daily. 10/28/10   Michele Mcalpine, MD  simethicone (MYLICON) 125 MG chewable tablet Chew 125 mg by mouth every 6 (six) hours as needed.      Historical Provider, MD  vitamin E (VITAMIN E) 400 UNIT capsule Take 400 Units by mouth daily.      Historical Provider, MD   Physical Exam: Filed Vitals:   04/09/11 1157 04/09/11 1328  BP: 126/69 132/86  Pulse: 113 101  Temp: 97.7 F (36.5 C) 97.6 F (36.4 C)  TempSrc: Oral Oral  Resp:  24  SpO2: 100% 98%     General:  Appears calm and comfortable.  Eyes: Pupils equal, round react to light. Normal lids, irises, conjunctiva.  ENT: Grossly normal hearing. Normal lips and tongue.  Neck: No lymphadenopathy or masses. No thyromegaly.  Cardiovascular: Regular rate and rhythm. No murmur, rub or gallop. 2+ lower extremity edema.  Respiratory: Clear to auscultation bilaterally. No  wheezes, rales or rhonchi. Normal respiratory effort.  Abdomen: Soft, nontender, nondistended. No CVA tenderness.  Skin: No rash or lesions seen. No induration.  Musculoskeletal: Tone appears be grossly normal.  Psychiatric: Alert, grossly normal mood. Confused.  Neurologic: Grossly nonfocal.  Labs on Admission:  Basic Metabolic Panel:  Lab 04/09/11 1610  NA 131*  K 4.3  CL 97  CO2 16*  GLUCOSE 175*  BUN 20  CREATININE 0.70  CALCIUM 9.6  MG --  PHOS --   Liver Function Tests:  Lab 04/09/11 1220  AST 28  ALT 24  ALKPHOS 271*  BILITOT 1.2  PROT 7.2  ALBUMIN 2.8*    Lab 04/09/11 1220  LIPASE 11  AMYLASE --   CBC:  Lab 04/09/11 1220  WBC 11.0*  NEUTROABS 10.3*  HGB 10.2*  HCT 29.2*  MCV 87.4  PLT 282  Cardiac Enzymes:  Lab 04/09/11 1220  CKTOTAL 55  CKMB 2.1  CKMBINDEX --  TROPONINI <0.30   Radiological Exams on Admission: Dg Chest 1 View  04/09/2011  *RADIOLOGY REPORT*  Clinical Data: Short of breath  CHEST - 1 VIEW  Comparison: Chest radiograph 03/22/2011  Findings: There is a power port in the right chest wall with tip in distal SVC.  Stable cardiac silhouette.  There are low lung volumes similar to prior.  No pulmonary edema.  Basilar atelectasis is present.  No focal infiltrate.  IMPRESSION:  1.  No significant change. 2.  Low lung volumes and basilar atelectasis.  Original Report Authenticated By: Genevive Bi, M.D.    Assessment/Plan 1. UTI: Empiric antibiotics with Rocephin. 2. Acute metabolic encephalopathy: Secondary to UTI. Superimposed on chronic dementia. Treatment as above 3. N/V/diarrhea: Abdominal exam is benign. Etiology unclear. Laboratory studies unremarkable. Daughter reports frequent bowel movements and GERD symptoms. No evidence to suggest obstruction. Abdominal film to exclude gross abnormality. Check C. Difficile, however I doubt this and would not suggest impaired treatment at this time. 4. GERD: Continue PPI.  Mylanta. 5. Hyponatremia: Secondary to dehydration most likely. Repeat basic metabolic panel in the morning. IV fluids. 6. Dehydration: IV fluids. 7. Abnormal LFTs: Possibly secondary to liver metastasis. Continue to follow. 8. Recent lower GI bleed secondary to warfarin/polyps: No anticoagulants. 9. Known pancreatic cancer: Continue outpatient evaluation per oncology. 10. History biliary obstruction secondary to pancreatic cancer: Continue to monitor. 11. Multiple myeloma 12. Rheumatoid arthritis  Discussed at bedside with daughter. All questions answered to her apparent satisfaction.  Code Status: Full code confirmed with daughter at bedside. Family Communication: Daughter Disposition Plan: Pending further evaluation and treatment. Daughters is open to skilled nursing facility as a possibility.  Brendia Sacks, MD  [facility] Pager [phone] 04/09/2011, 2:01 PM

## 2011-04-09 NOTE — ED Notes (Signed)
ZOX:[id number]<AV> Expected date:<BR> Expected time:11:47 AM<BR> Means of arrival:<BR> Comments:<BR> M51 - 87yoF pedal edema, pancreatic CA

## 2011-04-09 NOTE — Telephone Encounter (Signed)
Daughter called for Dr. Elnoria Howard and Corinda Gubler is covering for Drs. Elnoria Howard /Mann this weekend. Patient has pancreatic cancer and is s/p chemotherapy with an good response. She was hospitalized earlier this month with rectal bleeding in setting of Coumadin which she was apparently taking for central line associated DVT.  Colonoscopy by Dr. Elnoria Howard was unrevealing for a source. Coumadin was discontinued.   Patient hasn't had any further rectal bleeding, her hemoglobin on 03/30/11 was up to 10.2. Daughter calls frustrated because she feels mother's condition is deteriorating and she is now acutely ill with nausea and vomiting last night. She is taking fluids but will not eat. She has been having shortness of breath but it has become much worse now. Patient slept in chair last night so she could breath and also to help with acid reflux. She also describes chest pain when lying flat. Patient taking Dexilant and Zofran.  We discussed increasing Dexilant to BID before meals, or 12 hours apart.nue Zofran. With chest pain, worsening shortness of breath, nausea and vomiting patient should probably be evaluated in ED.

## 2011-04-09 NOTE — Telephone Encounter (Signed)
Daughter called and very upset and concerned.  Her mother has multiple medical issues and pancreatic cancer.  She has multiple complaints including abdominal and chest pain.  I have told her to take her to the ER for evaluation, and she asked that I pass this on to Dr. Kriste Basque.

## 2011-04-10 ENCOUNTER — Inpatient Hospital Stay (HOSPITAL_COMMUNITY): Payer: Medicare Other

## 2011-04-10 LAB — COMPREHENSIVE METABOLIC PANEL
Albumin: 2.4 g/dL — ABNORMAL LOW (ref 3.5–5.2)
Alkaline Phosphatase: 227 U/L — ABNORMAL HIGH (ref 39–117)
BUN: 20 mg/dL (ref 6–23)
Chloride: 101 mEq/L (ref 96–112)
Glucose, Bld: 138 mg/dL — ABNORMAL HIGH (ref 70–99)
Potassium: 3.7 mEq/L (ref 3.5–5.1)
Total Bilirubin: 0.8 mg/dL (ref 0.3–1.2)

## 2011-04-10 LAB — CBC
HCT: 24.9 % — ABNORMAL LOW (ref 36.0–46.0)
Hemoglobin: 8.7 g/dL — ABNORMAL LOW (ref 12.0–15.0)
MCH: 29.7 pg (ref 26.0–34.0)
MCHC: 34.7 g/dL (ref 30.0–36.0)
MCHC: 34.9 g/dL (ref 30.0–36.0)
MCV: 85.6 fL (ref 78.0–100.0)
Platelets: 297 10*3/uL (ref 150–400)
RDW: 16.5 % — ABNORMAL HIGH (ref 11.5–15.5)
WBC: 12.5 10*3/uL — ABNORMAL HIGH (ref 4.0–10.5)

## 2011-04-10 MED ORDER — PROMETHAZINE HCL 25 MG/ML IJ SOLN
12.5000 mg | Freq: Four times a day (QID) | INTRAMUSCULAR | Status: DC | PRN
  Administered 2011-04-10: 12.5 mg via INTRAVENOUS
  Filled 2011-04-10: qty 1

## 2011-04-10 MED ORDER — PIPERACILLIN-TAZOBACTAM 3.375 G IVPB
3.3750 g | Freq: Three times a day (TID) | INTRAVENOUS | Status: DC
  Administered 2011-04-10 – 2011-04-12 (×6): 3.375 g via INTRAVENOUS
  Filled 2011-04-10 (×9): qty 50

## 2011-04-10 MED ORDER — VANCOMYCIN HCL 1000 MG IV SOLR
750.0000 mg | Freq: Two times a day (BID) | INTRAVENOUS | Status: DC
  Administered 2011-04-10 – 2011-04-12 (×4): 750 mg via INTRAVENOUS
  Filled 2011-04-10 (×6): qty 750

## 2011-04-10 MED ORDER — PROMETHAZINE HCL 25 MG/ML IJ SOLN
6.2500 mg | Freq: Four times a day (QID) | INTRAMUSCULAR | Status: DC | PRN
  Administered 2011-04-11 – 2011-04-14 (×2): 6.25 mg via INTRAVENOUS
  Filled 2011-04-10 (×2): qty 1

## 2011-04-10 NOTE — Progress Notes (Signed)
ANTIBIOTIC CONSULT NOTE - INITIAL  Pharmacy Consult for Vancomycin/Zosyn Indication: Infection involving the bowel  Allergies  Allergen Reactions  . Bee Pollen Swelling    Patient Measurements: Height: 5\' 4"  (162.6 cm) Weight: 158 lb (71.668 kg) IBW/kg (Calculated) : 54.7   Vital Signs: Temp: 98.4 F (36.9 C) (01/27 1500) BP: 124/75 mmHg (01/27 1500) Pulse Rate: 110  (01/27 1500) Intake/Output from previous day: 01/26 0701 - 01/27 0700 In: 1204 [I.V.:1204] Out: -     Labs:  Basename 04/10/11 0500 04/10/11 0300 04/09/11 1220  WBC 12.5* -- 11.0*  HGB 9.3* -- 10.2*  PLT 297 -- 282  LABCREA -- -- --  CREATININE -- 0.55 0.70   Estimated Creatinine Clearance: 49.9 ml/min (by C-G formula based on Cr of 0.55).   Microbiology: Recent Results (from the past 720 hour(s))  MRSA PCR SCREENING     Status: Normal   Collection Time   03/22/11  9:56 PM      Component Value Range Status Comment   MRSA by PCR NEGATIVE  NEGATIVE  Final   CLOSTRIDIUM DIFFICILE BY PCR     Status: Normal   Collection Time   03/23/11  8:47 AM      Component Value Range Status Comment   C difficile by pcr NEGATIVE  NEGATIVE  Final   URINE CULTURE     Status: Normal (Preliminary result)   Collection Time   04/09/11 12:37 PM      Component Value Range Status Comment   Specimen Description URINE, CATHETERIZED   Final    Special Requests NONE   Final    Setup Time [id number]   Final    Colony Count PENDING   Incomplete    Culture Culture reincubated for better growth   Final    Report Status PENDING   Incomplete     Medical History: Past Medical History  Diagnosis Date  . Allergic rhinitis   . Bronchitis, mucopurulent recurrent   . Pulmonary fibrosis   . Lumbar back pain   . Osteopenia   . Arteriosclerotic dementia   . Paresthesia   . Multiple myeloma   . Anxiety   . PONV (postoperative nausea and vomiting)   . DJD (degenerative joint disease)   . Seronegative rheumatoid arthritis    knees  . Adenocarcinoma of pancreas     in remission    Medications:  Scheduled:    . cefTRIAXone (ROCEPHIN)  IV  1 g Intravenous Q24H  . donepezil  5 mg Oral Daily  . megestrol  20 mg Oral Daily  . pantoprazole  40 mg Oral Q1200  . predniSONE  5 mg Oral Q breakfast   Infusions:    . sodium chloride 125 mL/hr at 04/10/11 0941   PRN: acetaminophen, acetaminophen, alum & mag hydroxide-simeth, HYDROcodone-acetaminophen, ondansetron (ZOFRAN) IV, ondansetron, promethazine, simethicone, DISCONTD: promethazine Assessment: 78 yo female admitted due to vomiting and diarrhea. Vancomycin and Zosyn to be started for infection involving the bowel.  Goal of Therapy:  Vancomycin trough level 15-20 mcg/ml  Plan:  1. Zosyn 3.375 Gm IV Q8h over 4 hrs infusion 2. Vancomycin 750mg  IV Q12h 3. Will check Vanco trough level at steady state or if renal function changes. 4. Rocephin d/ced tonight   Dorethea Clan 04/10/2011,7:37 PM

## 2011-04-10 NOTE — Progress Notes (Signed)
Clinical Social Work aware of consult for ?placement.  Will need PT/OT to evaluate and also make recommendations.  At this time per MD, not appropriate for SNF nor ready for disposition due to patient medical attention.  Will alert oncoming weekday social worker of potential dc plan and follow up with family when appropriate.  Will follow up.  Home vs SNF.  Ashley Jacobs, MSW LCSW [phone]

## 2011-04-10 NOTE — Progress Notes (Signed)
Pt. Vomiting brown liquid.  Zofran given but not effective.  Dr. Mahala Menghini notified, orders for antiemetic.

## 2011-04-10 NOTE — Progress Notes (Signed)
Pt. Still vomiting.  Phenergan brought to bedside.  Daughter requests that Phenergan not be given right now.  Daughter has concerns that phenergan will make patient more confused.

## 2011-04-10 NOTE — Progress Notes (Signed)
Pt. Still having small amounts of brown emesis.  Daughter requests that phenergan be given after church member prays for patient.

## 2011-04-10 NOTE — Progress Notes (Signed)
Nasogastric tube inserted with 2 liter brown drainage.

## 2011-04-10 NOTE — Progress Notes (Signed)
[facility] progress note    Interval h/o:- 78 yo aaf known [place] Ca Pacnreas, S/p Gemcitabine chemo  underlying pulm fibrosis, DJD & LBP, Osteopenia, mild dementia, and Monoclonal Gammopathy/ smoldering myeloma per Dr. Dalene Carrow.      Began to have diarrhea at home and then started to have vomiting and decreased PO intake. Went to see Dr. Truett Perna   Started to have a cough, took hydrocodeine. Increasing abdominal pain + chest pain with radiation to the back as well .    ++Nausea.  LOwer extremities also have swollen more so than usual.  Admitted January 8-14 for GI bleed. This occurred in the context of warfarin for a basilic vein DVT. She underwent colonoscopy that time which revealed multiple polyps. CT of the abdomen at that time revealed recurrent pancreatic cancer with some pulmonary nodules consistent with metastatic disease. Anticoagulation was discontinued as repeat imaging demonstrated resolution of DVT   Subjective:  Very sleepy.  Just received Phenergan.  Daughter states had 2-3 episodes of vomiting. Today.  No noted diarrhea.   Objective: Vital signs in last 24 hours: Temp:  [97.4 F (36.3 C)-98.4 F (36.9 C)] 97.4 F (36.3 C) (01/27 0510) Pulse Rate:  [104-117] 117  (01/27 0510) Resp:  [18-20] 18  (01/27 0510) BP: (116-148)/(77-92) 116/77 mmHg (01/27 0510) SpO2:  [99 %-100 %] 99 % (01/27 0510) Weight:  [71.668 kg (158 lb)] 71.668 kg (158 lb) (01/26 1614) Weight change:   Intake/Output Summary (Last 24 hours) at 04/10/11 1518 Last data filed at 04/10/11 0255  Gross per 24 hour  Intake   1204 ml  Output      0 ml  Net   1204 ml   Lab Results:  Basename 04/10/11 0300 04/09/11 1220  NA 133* 131*  K 3.7 4.3  CL 101 97  CO2 18* 16*  GLUCOSE 138* 175*  BUN 20 20  CREATININE 0.55 0.70  CALCIUM 8.9 9.6  MG -- --  PHOS -- --    Basename 04/10/11 0300 04/09/11 1220  AST 16 28  ALT 19 24  ALKPHOS 227* 271*  BILITOT 0.8 1.2  PROT 6.4 7.2  ALBUMIN 2.4*  2.8*    Basename 04/09/11 1220  LIPASE 11  AMYLASE --    Basename 04/10/11 0500 04/09/11 1220  WBC 12.5* 11.0*  NEUTROABS -- 10.3*  HGB 9.3* 10.2*  HCT 26.8* 29.2*  MCV 85.6 87.4  PLT 297 282    Basename 04/09/11 1220  CKTOTAL 55  CKMB 2.1  CKMBINDEX --  TROPONINI <0.30   No components found with this basename: POCBNP:3 No results found for this basename: DDIMER:2 in the last 72 hours No results found for this basename: HGBA1C:2 in the last 72 hours No results found for this basename: CHOL:2,HDL:2,LDLCALC:2,TRIG:2,CHOLHDL:2,LDLDIRECT:2 in the last 72 hours No results found for this basename: TSH,T4TOTAL,FREET3,T3FREE,THYROIDAB in the last 72 hours No results found for this basename: VITAMINB12:2,FOLATE:2,FERRITIN:2,TIBC:2,IRON:2,RETICCTPCT:2 in the last 72 hours Micro Results:   Medications:  Prior to Admission:  Prescriptions prior to admission  Medication Sig Dispense Refill  . acetaminophen (TYLENOL) 325 MG tablet Take 325 mg by mouth every 6 (six) hours as needed. For pain      . ALPRAZolam (XANAX) 0.5 MG tablet Take 0.25 mg by mouth as needed. For anxiety      . dexlansoprazole (DEXILANT) 60 MG capsule Take 60 mg by mouth as needed. For gas      . donepezil (ARICEPT) 5 MG tablet TAKE 1 TABLET EVERY DAY  90 tablet  1  . furosemide (LASIX) 20 MG tablet Take 2 tablets by mouth every morning  60 tablet  6  . guaifenesin (MUCUS RELIEF) 400 MG TABS Take 400 mg by mouth daily.       Marland Kitchen HYDROcodone-homatropine (HYCODAN) 5-1.5 MG/5ML syrup Take 5 mLs by mouth every 6 (six) hours as needed. For cough      . loperamide (IMODIUM A-D) 2 MG tablet 1 every 4-6 hours as needed for watery diarrhea      . megestrol (MEGACE) 20 MG tablet TAKE 1 TABLET EVERY DAY  90 tablet  1  . NEOMYCIN-POLYMYXIN-HC, OTIC, (CORTISPORIN) 1 % SOLN Place 3 drops into the right ear 3 (three) times daily.  1 Bottle  0  . ondansetron (ZOFRAN) 4 MG tablet Take 4 mg by mouth every 8 (eight) hours as needed.  For nausea      . polyethylene glycol powder (MIRALAX) powder Take 17 g by mouth daily as needed.  255 g  0  . potassium chloride SA (K-DUR,KLOR-CON) 20 MEQ tablet Take 20 mEq by mouth 2 (two) times daily.      . predniSONE (DELTASONE) 5 MG tablet Take 1 tablet (5 mg total) by mouth daily.  30 tablet  11  . simethicone (MYLICON) 125 MG chewable tablet Chew 125 mg by mouth every 6 (six) hours as needed.         Scheduled:   . cefTRIAXone (ROCEPHIN)  IV  1 g Intravenous Q24H  . donepezil  5 mg Oral Daily  . megestrol  20 mg Oral Daily  . pantoprazole  40 mg Oral Q1200  . predniSONE  5 mg Oral Q breakfast  . DISCONTD: sodium chloride   Intravenous Once  . DISCONTD: sodium chloride   Intravenous STAT   Continuous:   . sodium chloride 125 mL/hr at 04/10/11 0941   YQM:[id number], acetaminophen, alum & mag hydroxide-simeth, HYDROcodone-acetaminophen, ondansetron (ZOFRAN) IV, ondansetron, promethazine, simethicone Anti-infectives     Start     Dose/Rate Route Frequency Ordered Stop   04/10/11 1400   cefTRIAXone (ROCEPHIN) 1 g in dextrose 5 % 50 mL IVPB        1 g 100 mL/hr over 30 Minutes Intravenous Every 24 hours 04/09/11 1657     04/09/11 1400   cefTRIAXone (ROCEPHIN) 1 g in dextrose 5 % 50 mL IVPB        1 g 100 mL/hr over 30 Minutes Intravenous  Once 04/09/11 1345 04/09/11 1507         Scheduled Meds:   . cefTRIAXone (ROCEPHIN)  IV  1 g Intravenous Q24H  . donepezil  5 mg Oral Daily  . megestrol  20 mg Oral Daily  . pantoprazole  40 mg Oral Q1200  . predniSONE  5 mg Oral Q breakfast  . DISCONTD: sodium chloride   Intravenous Once  . DISCONTD: sodium chloride   Intravenous STAT   Continuous Infusions:   . sodium chloride 125 mL/hr at 04/10/11 0941   PRN Meds:.acetaminophen, acetaminophen, alum & mag hydroxide-simeth, HYDROcodone-acetaminophen, ondansetron (ZOFRAN) IV, ondansetron, promethazine, simethicone   Assessment/Plan  1. UTI: Empiric antibiotics with  Rocephin.  White count slightly elevated. Repeat in a.m. 2. Acute metabolic encephalopathy: Secondary to UTI. Superimposed on chronic dementia. Will get an ammonia level-discontinue Tylenol 3. N/V/diarrhea: Abdominal exam is benign. Etiology unclear. Laboratory studies unremarkable. Daughter reports frequent bowel movements and GERD symptoms. Plain film negative for obstruction. Will involve GI in her care. Daughter states that patient  was supposed to have capsule endoscopy coordinated.  Will treat judiciously use Phenergan, makes her sleepy 4. GERD: Continue PPI. Mylanta. 5. Hyponatremia: Secondary to dehydration most likely. Repeat basic metabolic panel in the morning. IV fluids 2 continue 6. Dehydration: IV fluids. As n.p.o. 7. Abnormal LFTs: Possibly secondary to liver metastasis. Continue to follow.  Discontinue Tylenol 8. Recent lower GI bleed secondary to warfarin/polyps: No anticoagulants. 9. Known pancreatic cancer: Continue outpatient evaluation per oncology.  Lipase negative. Have [place] oncology to weigh in. Question is does she need further workup and also MRI and PET scan. Daughter states he requested these tests and was told she would need capsule endoscopy first. She has a relatively poor ECoG score just based on my first impression suffer and I would like oncology to formally addressed this with family. I appreciate input in advance 10. History biliary obstruction secondary to pancreatic cancer: Continue to monitor. 11. Multiple myeloma-per oncologist 12. Rheumatoid arthritis-stable at present 13. Recent DVT on Coumadin-noncooperation currently   Discussed at bedside with daughter. All questions answered to her apparent satisfaction.  Code Status: Full code confirmed with daughter at bedside.  Family Communication: Daughter  Disposition Plan: Pending further evaluation and treatment. Daughters is open to skilled nursing facility as a possibility.    LOS: 1 day    [facility] 04/10/2011, 3:18 PM

## 2011-04-10 NOTE — Progress Notes (Signed)
Patient continues to vomit.  Dr. Mahala Menghini notified with orders for abdominal xray and ng tube.

## 2011-04-11 ENCOUNTER — Inpatient Hospital Stay (HOSPITAL_COMMUNITY): Payer: Medicare Other

## 2011-04-11 DIAGNOSIS — N390 Urinary tract infection, site not specified: Principal | ICD-10-CM

## 2011-04-11 DIAGNOSIS — D638 Anemia in other chronic diseases classified elsewhere: Secondary | ICD-10-CM

## 2011-04-11 DIAGNOSIS — C250 Malignant neoplasm of head of pancreas: Secondary | ICD-10-CM

## 2011-04-11 DIAGNOSIS — R109 Unspecified abdominal pain: Secondary | ICD-10-CM

## 2011-04-11 MED ORDER — PANTOPRAZOLE SODIUM 40 MG PO PACK
40.0000 mg | PACK | Freq: Every day | ORAL | Status: DC
  Administered 2011-04-11 – 2011-04-14 (×4): 40 mg via ORAL
  Filled 2011-04-11 (×7): qty 20

## 2011-04-11 NOTE — Progress Notes (Signed)
PROGRESS NOTE  Heather Dorsey:[id number] DOB: 26-Nov-1923 DOA: 04/09/2011 PCP: Michele Mcalpine, MD, MD  Brief narrative: 78 yo aaf known [place] [facility]re[facility], S/p Gemcitabine chemo underlying pulm fibrosis, DJD & LBP, Osteopenia, mild dementia, and Monoclonal Gammopathy/ smoldering myeloma per Dr. Dalene Carrow.  Began to have diarrhea at home and then started to have vomiting and decreased PO intake. Went to see Dr. Truett Perna Started to have a cough, took hydrocodeine. Increasing abdominal pain + chest pain with radiation to the back as well . ++Nausea. LOwer extremities also have swollen more so than usual.   Admitted January 8-14 for GI bleed. This occurred in the context of warfarin for a basilic vein DVT. She underwent colonoscopy that time which revealed multiple polyps. CT of the abdomen at that time revealed recurrent pancreatic cancer with some pulmonary nodules consistent with metastatic disease. Anticoagulation was discontinued as repeat imaging demonstrated resolution of DVT   Past medical history: Per above  Consultants:  Oncology-Dr. Rolm Baptise  Procedures:  CT scan abdomen ordered 1/29-pending  Abdominal x-ray = no overt small bowel obstruction  Antibiotics:  Rocephin 1/27-1/28  Vancomycin 1/28  Zosyn 1/28   Subjective  Much more alert oriented. Has some mild abdominal pain. No other complaints. Cannot tell me the date or day season. Knows that Fay Records is the president.  Daughter in room with multiple questions  Interim History: NG tube placed yielding 4 L of bilious emesis 1/28 Oliguric 1/28   Objective      Objective: Filed Vitals:   04/10/11 2052 04/11/11 0603 04/11/11 0910 04/11/11 1338  BP: 107/61 92/63  98/64  Pulse: 112 105  104  Temp: 98.4 F (36.9 C) 97.6 F (36.4 C)  98.5 F (36.9 C)  TempSrc: Oral Oral  Oral  Resp: 20 18  18   Height:      Weight:      SpO2: 97% 99% 98% 95%    Intake/Output Summary (Last 24 hours) at 04/11/11 1613 Last  data filed at 04/11/11 1100  Gross per 24 hour  Intake   3395 ml  Output   3850 ml  Net   -455 ml    Exam:  General: Alert oriented to person and place only. NG tube in place no icterus no pallor Cardiovascular: S1-S2 no murmur rub or gallop.  Regular rate and rhythm Respiratory: Clinically clear no added sound Abdomen: Slightly tender in the epigastrium with some mild distention. Bowel sounds decreased Skin soft left lower extremity might have some edema Neuro oriented to person and place only  Data Reviewed: Basic Metabolic Panel:  Lab 04/10/11 8119 04/09/11 1220  NA 133* 131*  K 3.7 4.3  CL 101 97  CO2 18* 16*  GLUCOSE 138* 175*  BUN 20 20  CREATININE 0.55 0.70  CALCIUM 8.9 9.6  MG -- --  PHOS -- --   Liver Function Tests:  Lab 04/10/11 0300 04/09/11 1220  AST 16 28  ALT 19 24  ALKPHOS 227* 271*  BILITOT 0.8 1.2  PROT 6.4 7.2  ALBUMIN 2.4* 2.8*    Lab 04/09/11 1220  LIPASE 11  AMYLASE --   No results found for this basename: AMMONIA:5 in the last 168 hours CBC:  Lab 04/10/11 2008 04/10/11 0500 04/09/11 1220  WBC 13.6* 12.5* 11.0*  NEUTROABS -- -- 10.3*  HGB 8.7* 9.3* 10.2*  HCT 24.9* 26.8* 29.2*  MCV 85.6 85.6 87.4  PLT 255 297 282   Cardiac Enzymes:  Lab 04/09/11 1220  CKTOTAL 55  CKMB 2.1  CKMBINDEX --  TROPONINI <0.30   BNP: No components found with this basename: POCBNP:5 CBG: No results found for this basename: GLUCAP:5 in the last 168 hours  Recent Results (from the past 240 hour(s))  URINE CULTURE     Status: Normal (Preliminary result)   Collection Time   04/09/11 12:37 PM      Component Value Range Status Comment   Specimen Description URINE, CATHETERIZED   Final    Special Requests NONE   Final    Culture  Setup Time [id number]   Final    Colony Count >=100,000 COLONIES/ML   Final    Culture     Final    Value: ESCHERICHIA COLI     GRAM NEGATIVE RODS   Report Status PENDING   Incomplete      Studies: Dg Chest 1  View  04/09/2011  *RADIOLOGY REPORT*  Clinical Data: Short of breath  CHEST - 1 VIEW  Comparison: Chest radiograph 03/22/2011  Findings: There is a power port in the right chest wall with tip in distal SVC.  Stable cardiac silhouette.  There are low lung volumes similar to prior.  No pulmonary edema.  Basilar atelectasis is present.  No focal infiltrate.  IMPRESSION:  1.  No significant change. 2.  Low lung volumes and basilar atelectasis.  Original Report Authenticated By: Genevive Bi, M.D.   Dg Chest 1 View  03/22/2011  *RADIOLOGY REPORT*  Clinical Data: Short of breath.  Pulmonary fibrosis.  Pancreatic cancer.  CHEST - 1 VIEW  Comparison: 11/04/2010.  Findings: Patient is rotated to the right.  Right IJ Port-A- Cath/power port is unchanged.  Low lung volumes with basilar atelectasis.  No airspace disease.  No effusion.  Cardiopericardial silhouette is probably unchanged allowing for low inspiratory volumes.  IMPRESSION: Low volume chest.  Stable support apparatus.  No gross consolidation.  Original Report Authenticated By: Andreas Newport, M.D.   Abd 1 View (kub)  04/09/2011  *RADIOLOGY REPORT*  Clinical Data: Nausea and vomiting.  Pancreatic carcinoma.  ABDOMEN - 1 VIEW  Comparison: 11/04/2010  Findings: A paucity of bowel gas is seen.  Internal biliary wall stent remains in place.  No definite radiopaque calculi identified. Vascular calcifications seen within the pelvis.  IMPRESSION: Paucity of bowel gas noted. Internal biliary wall stent remains in place.  Original Report Authenticated By: Danae Orleans, M.D.   Ct Angio Chest W/cm &/or Wo Cm  03/22/2011  *RADIOLOGY REPORT*  Clinical Data:  Shortness of breath and rectal bleeding.  CT ANGIOGRAPHY CHEST CT ABDOMEN AND PELVIS WITH CONTRAST  Technique:  Multidetector CT imaging of the chest was performed using the standard protocol during bolus administration of intravenous contrast.  Multiplanar CT image reconstructions including MIPs were obtained to  evaluate the vascular anatomy. Multidetector CT imaging of the abdomen and pelvis was performed using the standard protocol during bolus administration of intravenous contrast.  Contrast: OMNIPAQUE IOHEXOL 300 MG/ML IV SOLN  Comparison:  CT chest 02/10/2010.  CTA CHEST  Findings:  The chest wall is unremarkable.  No breast masses, supraclavicular or axillary lymphadenopathy.  A right-sided Port-A- Cath is noted.  The bony thorax is intact.  Lytic and sclerotic appearing bone changes suspicious for metastatic disease.  The heart is normal in size.  No pericardial effusion.  There are scattered mediastinal and hilar lymph nodes but no adenopathy.  The esophagus is grossly normal.  The aorta is tortuous and mildly ectatic with atherosclerotic changes but no  dissection or focal aneurysm.  Dense coronary artery calcifications are noted.  The pulmonary arterial tree is fairly well opacified.  No filling defects to suggest pulmonary emboli.  There is noted in the left brachiocephalic vein likely from the injection.  Examination of the lung parenchyma demonstrates pulmonary scarring and peribronchial thickening.  Vague nodular lung lesions.  Cannot exclude metastatic disease.  No edema or effusions.   Review of the MIP images confirms the above findings.  IMPRESSION:  1.  No CT findings for pulmonary embolism. 2.  No mediastinal or hilar adenopathy. 3.  Scattered ill-defined pulmonary lesions.  Pulmonary metastatic disease is possible.  Close CT follow-up is recommended.  CT ABDOMEN AND PELVIS  Findings: A biliary stent is in place.  There is associated intrahepatic biliary air.  There is a large low attenuation area in the medial segment of the left hepatic lobe and also in part the right hepatic lobe.   This was not present on the prior examination.  It could be a treated lesion.  Vessels and the biliary tree run through this area and severe focal fat is possible.  Comparison with any more recent CT scan may be  helpful. The gallbladder is markedly distended and gallbladder folds are noted.  Cystic duct obstruction is possible although no definite wall thickening or gallstones.  The pancreatic head demonstrates diffuse low attenuation without discrete mass.  The pancreatic body and tail are quite atrophied.  There is ill-defined soft tissue density surrounding the right side of the celiac access which is likely tumor.  Scattered small lymph nodes are noted also.  The spleen is normal in size.  No focal lesions.  The adrenal glands and kidneys are unremarkable.  The stomach is grossly normal.  The duodenum and small bowel are unremarkable.  The colon demonstrates severe and diffuse inflammation most consistent with C difficile colitis.  The surrounding inflammation and fluid.  The bladder has a Foley catheter in it.  Mild wall thickening. There is moderate free pelvic fluid.  No pelvic mass or adenopathy.  Very heterogeneous appearance of the bone marrow.  Cannot exclude metastatic disease.  Review of the MIP images confirms the above findings.  IMPRESSION:  1.  Large area of low attenuation in the liver could be treated disease or severe focal fat.  Vessels and the biliary tree run through this area so I do not think it is mass.  MRI may be helpful for further evaluation. 2.  Marked distention of the gallbladder. 3.  Ill-defined low attenuation of the pancreatic head with adjacent soft tissue tumor and small lymph nodes. 4.  Severe diffuse inflammation of the colon most consistent with C difficile colitis. 5.  Moderate amount of free pelvic fluid. 6.  Biliary stent in place.  Original Report Authenticated By: P. Loralie Champagne, M.D.   US Abdomen Complete  03/22/2011  *RADIOLOGY REPORT*  Clinical Data:  Abdominal pain, new transaminitis.  History of pancreatic cancer status post biliary stent in 2011  COMPLETE ABDOMINAL ULTRASOUND  Comparison:  None.  Findings:  Gallbladder:  Gallbladder sludge.  No gallbladder wall  thickening, pericholecystic fluid, or sonographic Murphy's sign.  Common bile duct:  Measures 10 mm.  Liver:  No focal lesion identified.  Pneumobilia, likely secondary to indwelling biliary stent.  IVC:  Appears normal.  Pancreas:  Poorly visualized.  Pancreatic duct stent in place.  Spleen:  Measures 8.1 cm.  Right Kidney:  Measures 9.6 cm.  No mass or hydronephrosis.  Left  Kidney:  Measures 9.6 cm.  No mass or hydronephrosis.  Abdominal aorta:  No aneurysm identified.  Additional comments:  Ascites.  IMPRESSION: Pancreatic duct stent in place.  Secondary pneumobilia.  Gallbladder sludge, without associated findings to suggest acute cholecystitis.  Ascites.  Original Report Authenticated By: Charline Bills, M.D.   Ct Abdomen Pelvis W Contrast  03/22/2011  *RADIOLOGY REPORT*  Clinical Data:  Shortness of breath and rectal bleeding.  CT ANGIOGRAPHY CHEST CT ABDOMEN AND PELVIS WITH CONTRAST  Technique:  Multidetector CT imaging of the chest was performed using the standard protocol during bolus administration of intravenous contrast.  Multiplanar CT image reconstructions including MIPs were obtained to evaluate the vascular anatomy. Multidetector CT imaging of the abdomen and pelvis was performed using the standard protocol during bolus administration of intravenous contrast.  Contrast: OMNIPAQUE IOHEXOL 300 MG/ML IV SOLN  Comparison:  CT chest 02/10/2010.  CTA CHEST  Findings:  The chest wall is unremarkable.  No breast masses, supraclavicular or axillary lymphadenopathy.  A right-sided Port-A- Cath is noted.  The bony thorax is intact.  Lytic and sclerotic appearing bone changes suspicious for metastatic disease.  The heart is normal in size.  No pericardial effusion.  There are scattered mediastinal and hilar lymph nodes but no adenopathy.  The esophagus is grossly normal.  The aorta is tortuous and mildly ectatic with atherosclerotic changes but no dissection or focal aneurysm.  Dense coronary artery  calcifications are noted.  The pulmonary arterial tree is fairly well opacified.  No filling defects to suggest pulmonary emboli.  There is noted in the left brachiocephalic vein likely from the injection.  Examination of the lung parenchyma demonstrates pulmonary scarring and peribronchial thickening.  Vague nodular lung lesions.  Cannot exclude metastatic disease.  No edema or effusions.   Review of the MIP images confirms the above findings.  IMPRESSION:  1.  No CT findings for pulmonary embolism. 2.  No mediastinal or hilar adenopathy. 3.  Scattered ill-defined pulmonary lesions.  Pulmonary metastatic disease is possible.  Close CT follow-up is recommended.  CT ABDOMEN AND PELVIS  Findings: A biliary stent is in place.  There is associated intrahepatic biliary air.  There is a large low attenuation area in the medial segment of the left hepatic lobe and also in part the right hepatic lobe.   This was not present on the prior examination.  It could be a treated lesion.  Vessels and the biliary tree run through this area and severe focal fat is possible.  Comparison with any more recent CT scan may be helpful. The gallbladder is markedly distended and gallbladder folds are noted.  Cystic duct obstruction is possible although no definite wall thickening or gallstones.  The pancreatic head demonstrates diffuse low attenuation without discrete mass.  The pancreatic body and tail are quite atrophied.  There is ill-defined soft tissue density surrounding the right side of the celiac access which is likely tumor.  Scattered small lymph nodes are noted also.  The spleen is normal in size.  No focal lesions.  The adrenal glands and kidneys are unremarkable.  The stomach is grossly normal.  The duodenum and small bowel are unremarkable.  The colon demonstrates severe and diffuse inflammation most consistent with C difficile colitis.  The surrounding inflammation and fluid.  The bladder has a Foley catheter in it.  Mild  wall thickening. There is moderate free pelvic fluid.  No pelvic mass or adenopathy.  Very heterogeneous appearance of the bone  marrow.  Cannot exclude metastatic disease.  Review of the MIP images confirms the above findings.  IMPRESSION:  1.  Large area of low attenuation in the liver could be treated disease or severe focal fat.  Vessels and the biliary tree run through this area so I do not think it is mass.  MRI may be helpful for further evaluation. 2.  Marked distention of the gallbladder. 3.  Ill-defined low attenuation of the pancreatic head with adjacent soft tissue tumor and small lymph nodes. 4.  Severe diffuse inflammation of the colon most consistent with C difficile colitis. 5.  Moderate amount of free pelvic fluid. 6.  Biliary stent in place.  Original Report Authenticated By: P. Loralie Champagne, M.D.   Dg Abd Acute W/chest  04/10/2011  *RADIOLOGY REPORT*  Clinical Data: Abdominal pain.  Distention.  ACUTE ABDOMEN SERIES (ABDOMEN 2 VIEW & CHEST 1 VIEW)  Comparison: 04/09/2011  Findings: Right-sided power port noted with tip projecting over the SVC.  A nasogastric tube is present.  Very low lung volumes are present with airspace opacity in the lung bases favoring atelectasis.  A left side down lateral decubitus view of the abdomen does not demonstrate free intraperitoneal gas.  A biliary stent is observed. Air-fluid level in an unidentifiable loop of central abdominal bowel could be in the stomach, small bowel, or colon.  Most of the bowel is gasless.  IMPRESSION:  1.  Predominately gasless bowel.  No definite dilated bowel is noted.  A single air fluid level on the lateral decubitus view the abdomen is in unidentifiable bowel, possibly colon, stomach, or small bowel. 2.  Biliary stent. 3.  Very low lung volumes, with bibasilar airspace opacities favoring atelectasis over pneumonia.  Original Report Authenticated By: Dellia Cloud, M.D.    Scheduled Meds:   . donepezil  5 mg Oral Daily    . megestrol  20 mg Oral Daily  . pantoprazole sodium  40 mg Oral Q1200  . piperacillin-tazobactam (ZOSYN)  IV  3.375 g Intravenous Q8H  . predniSONE  5 mg Oral Q breakfast  . vancomycin  750 mg Intravenous Q12H  . DISCONTD: cefTRIAXone (ROCEPHIN)  IV  1 g Intravenous Q24H  . DISCONTD: pantoprazole  40 mg Oral Q1200   Continuous Infusions:   . sodium chloride 125 mL/hr at 04/11/11 1417     Assessment/Plan  1. E. coli UTI: Empiric antibiotics with vancomycin and Zosyn day #2, received one day of Rocephin. White count slightly elevated. Repeat in a.m. 2. Acute metabolic encephalopathy: Secondary to UTI. Superimposed on chronic dementia 3. N/V/diarrhea: Abdomen more distended-2 sets of acute abdominal series are negative-proceed to CT scan abdomen Etiology unclear.  GI consulted 1/28. Daughter states that patient was supposed to have capsule endoscopy coordinated. Has no stool discontinue C. difficile orders 4. GERD: Continue PPI. Mylanta. 5. Hyponatremia: Secondary to dehydration most likely. Repeat basic metabolic panel in the morning. IV fluids 2 continue 6. Dehydration: IV fluids. As n.p.o. 7. Abnormal LFTs: Possibly secondary to liver metastasis. Continue to follow. Discontinue Tylenol 8. Recent lower GI bleed secondary to warfarin/polyps: No anticoagulants. 9. Known pancreatic cancer: Continue outpatient evaluation per oncology. Lipase negative. Have [place] oncology to weigh in. Question is does she need further workup and also MRI and PET scan. Daughter states he requested these tests and was told she would need capsule endoscopy first. She has a relatively poor Ecog score just based on my first impression suffer and I would like oncology to formally  addressed this with family. I appreciate input in advance 10. History biliary obstruction secondary to pancreatic cancer: Continue to monitor. 11. Multiple myeloma-per oncologist 12. Rheumatoid arthritis-stable at present 13. Recent DVT on  Coumadin-noncoagulation currently secondary to be GI bleed    Code Status: Full code confirmed with daughter at bedside.  Family Communication: Daughter  Disposition Plan: Pending further evaluation and treatment. Daughters is open to skilled nursing facility as a possibility.    Pleas Koch, MD  [facility] Pager [phone] 04/11/2011, 4:13 PM    LOS: 2 days

## 2011-04-11 NOTE — Evaluation (Signed)
Physical Therapy Evaluation Patient Details Name: Heather Dorsey MRN: [id number] DOB: [birth year] Today's Date: 04/11/2011 Time: 4696-2952 Charge: EVII  Problem List:  Patient Active Problem List  Diagnoses  . MULTIPLE  MYELOMA  . ARTERIOSCLEROTIC DEMENTIA  . ANXIETY  . ALLERGIC RHINITIS  . BRONCHITIS, RECURRENT  . PULMONARY FIBROSIS  . SERONEGATIVE RHEUMATOID ARTHRITIS  . DEGENERATIVE JOINT DISEASE  . BACK PAIN, LUMBAR  . OSTEOPENIA  . PARESTHESIA  . ADENOCARCINOMA, PANCREAS  . Facial swelling  . UTI (lower urinary tract infection)  . Cataracts, bilateral  . DVT of upper extremity (deep vein thrombosis)  . GI bleeding  . Abnormal LFTs  . Anemia of chronic disease    Past Medical History:  Past Medical History  Diagnosis Date  . Allergic rhinitis   . Bronchitis, mucopurulent recurrent   . Pulmonary fibrosis   . Lumbar back pain   . Osteopenia   . Arteriosclerotic dementia   . Paresthesia   . Multiple myeloma   . Anxiety   . PONV (postoperative nausea and vomiting)   . DJD (degenerative joint disease)   . Seronegative rheumatoid arthritis     knees  . Adenocarcinoma of pancreas     in remission   Past Surgical History:  Past Surgical History  Procedure Date  . Appendectomy   . Abdominal hysterectomy   . Common dile duct stent 02/2010    for pancreatic cancer  . Colonoscopy 03/24/2011    Procedure: COLONOSCOPY;  Surgeon: Theda Belfast, MD;  Location: [facility];  Service: Endoscopy;  Laterality: N/A;    PT Assessment/Plan/Recommendation PT Assessment Clinical Impression Statement: Pt would benefit from acute PT services to improve independence and safety with transfers and ambulation as well as increase activity tolerance to prepare for d/c to SNF. PT Recommendation/Assessment: Patient will need skilled PT in the acute care venue PT Problem List: Decreased strength;Decreased activity tolerance;Decreased mobility;Decreased safety awareness;Decreased  knowledge of use of DME PT Therapy Diagnosis : Difficulty walking;Generalized weakness PT Plan PT Frequency: Min 3X/week PT Treatment/Interventions: DME instruction;Gait training;Functional mobility training;Therapeutic activities;Therapeutic exercise;Patient/family education PT Recommendation Follow Up Recommendations: Skilled nursing facility Equipment Recommended: Defer to next venue PT Goals  Acute Rehab PT Goals PT Goal Formulation: With patient/family Time For Goal Achievement: 2 weeks Pt will go Supine/Side to Sit: with supervision PT Goal: Supine/Side to Sit - Progress: Goal set today Pt will go Sit to Supine/Side: with supervision PT Goal: Sit to Supine/Side - Progress: Goal set today Pt will go Sit to Stand: with supervision PT Goal: Sit to Stand - Progress: Goal set today Pt will go Stand to Sit: with supervision PT Goal: Stand to Sit - Progress: Goal set today Pt will Ambulate: 51 - 150 feet;with supervision;with rolling walker PT Goal: Ambulate - Progress: Goal set today Pt will Perform Home Exercise Program: with supervision, verbal cues required/provided PT Goal: Perform Home Exercise Program - Progress: Goal set today  PT Evaluation Precautions/Restrictions  Precautions Precautions: Fall Prior Functioning  Home Living Lives With: Alone;Other (Comment) (Daughter staying with her) Receives Help From: Family Type of Home: House Home Layout: One level Home Access: Stairs to enter Entrance Stairs-Rails: Right Entrance Stairs-Number of Steps: 4 Bathroom Shower/Tub: Engineer, manufacturing systems: Standard Bathroom Accessibility: Yes How Accessible: Accessible via walker Home Adaptive Equipment: Bedside commode/3-in-1;Walker - rolling;Straight cane;Shower chair with back Additional Comments: Daughter states pt would benefit from tub bench secondary to decreased mobility and increased difficulty step into tub to shower chair, not safe at  this time. Prior  Function Level of Independence: Requires assistive device for independence;Independent with basic ADLs;Needs assistance with homemaking;Independent with transfers Comments: Pt was using cane occasionally prior to recent admission then since recent previous admission pt was using RW Cognition Cognition Arousal/Alertness: Awake/alert Overall Cognitive Status: Impaired Sensation/Coordination   Extremity Assessment RLE Strength RLE Overall Strength Comments: grossly at least 3/5 per functional observation LLE Strength LLE Overall Strength Comments: grossly at least 3/5 per functional observation Mobility (including Balance) Bed Mobility Bed Mobility: No Transfers Transfers: Yes Sit to Stand: 4: Min assist;From chair/3-in-1;With upper extremity assist Sit to Stand Details (indicate cue type and reason): assist for weakness, verbal cues for hand placement Stand to Sit: 4: Min assist;To chair/3-in-1;With upper extremity assist Stand to Sit Details: assist for controlled descent, verbal cues for hand placement Stand Pivot Transfers: 4: Min assist Stand Pivot Transfer Details (indicate cue type and reason): with RW, increased verbal cues for hand placement, moving RW and not reaching for [facility] armrest too soon Ambulation/Gait Ambulation/Gait: Yes Ambulation/Gait Assistance: 4: Min assist Ambulation/Gait Assistance Details (indicate cue type and reason): assist for weakness, increased verbal cues for safe RW distance, pt reported feeling "funny" but denied dizziness, pt with decreased responses to questions as well so recliner brought for pt to sit Ambulation Distance (Feet): 13 Feet Assistive device: Rolling walker Gait Pattern: Decreased stride length;Shuffle;Trunk flexed    Exercise    End of Session PT - End of Session Equipment Utilized During Treatment: Gait belt Activity Tolerance: Patient limited by fatigue Patient left: in chair;with call bell in reach;with family/visitor  present General Behavior During Session: [facility] for tasks performed Cognition: Impaired  Markes Shatswell,KATHrine E 04/11/2011, 3:40 PM Pager: [phone]

## 2011-04-11 NOTE — Progress Notes (Signed)
IP PROGRESS NOTE  Subjective:   She is admitted with nausea/vomiting, abdominal pain, and a urinary tract infection on 04/09/2011. The nausea and vomiting persisted and an NG tube is placed. She continues to have drainage from the NG tube.  She is lethargic, but arousable. She denies pain at present.  Objective: Vital signs in last 24 hours: Blood pressure 98/64, pulse 104, temperature 98.5 F (36.9 C), temperature source Oral, resp. rate 18, height 5\' 4"  (1.626 m), weight 158 lb (71.668 kg), SpO2 95.00%.  Intake/Output from previous day: 01/27 0701 - 01/28 0700 In: 3815 [P.O.:480; I.V.:3025; NG/GT:60; IV Piggyback:250] Out: 3950 [Urine:250; Emesis/NG output:3700]  Physical Exam:  HEENT: No thrush Lungs: Inspiratory rhonchi at the low posterior chest bilaterally Cardiac: Regular rate and rhythm Abdomen: Soft and nontender Extremities: Pitting edema in the legs/feet bilaterally   Portacath/PICC-without erythema  Lab Results:  Basename 04/10/11 2008 04/10/11 0500  WBC 13.6* 12.5*  HGB 8.7* 9.3*  HCT 24.9* 26.8*  PLT 255 297    BMET  Basename 04/10/11 0300 04/09/11 1220  NA 133* 131*  K 3.7 4.3  CL 101 97  CO2 18* 16*  GLUCOSE 138* 175*  BUN 20 20  CREATININE 0.55 0.70  CALCIUM 8.9 9.6    Studies/Results: Dg Abd Acute W/chest  04/10/2011  *RADIOLOGY REPORT*  Clinical Data: Abdominal pain.  Distention.  ACUTE ABDOMEN SERIES (ABDOMEN 2 VIEW & CHEST 1 VIEW)  Comparison: 04/09/2011  Findings: Right-sided power port noted with tip projecting over the SVC.  A nasogastric tube is present.  Very low lung volumes are present with airspace opacity in the lung bases favoring atelectasis.  A left side down lateral decubitus view of the abdomen does not demonstrate free intraperitoneal gas.  A biliary stent is observed. Air-fluid level in an unidentifiable loop of central abdominal bowel could be in the stomach, small bowel, or colon.  Most of the bowel is gasless.  IMPRESSION:   1.  Predominately gasless bowel.  No definite dilated bowel is noted.  A single air fluid level on the lateral decubitus view the abdomen is in unidentifiable bowel, possibly colon, stomach, or small bowel. 2.  Biliary stent. 3.  Very low lung volumes, with bibasilar airspace opacities favoring atelectasis over pneumonia.  Original Report Authenticated By: Dellia Cloud, M.D.    Medications: I have reviewed the patient's current medications.  Assessment/Plan:    1. History of locally advanced pancreas cancer, currently maintained off of specific therapy.  2. Admission with nausea/vomiting and abdominal pain-potentially related to the pancreas cancer, a viral infection, the urinary tract infection, or another GI process.  3. Altered mental status secondary to acute delirium and underlying dementia  4. History of multiple myeloma-review of the January 2013 CT scans is suggestive of myelomatous lesions in the bone marrow and bones  5. Anemia secondary to chronic disease and recent bleeding  6. History of rheumatoid arthritis  7. Abdominal pain-likely related to pancreas cancer  8. Urinary tract infection  Recommendations: -Continue supportive care measures -I agree with the plan to proceed with a CT of the abdomen to further assess the nausea/vomiting and abdominal pain.  -She has multiple comorbid conditions and a very poor performance status. She appears to be a candidate for hospice care. I plan to discuss this with her daughter after review of the imaging studies. She is not a candidate for systemic chemotherapy at present.    LOS: 2 days   Jeanean Hollett BRADLEY  04/11/2011, 6:04 PM

## 2011-04-11 NOTE — Progress Notes (Signed)
INITIAL ADULT NUTRITION ASSESSMENT Date: 04/11/2011   Time: 12:32 PM Reason for Assessment: Nutrition risk   ASSESSMENT: Female 78 y.o.  Dx: Confusion  Hx:  Past Medical History  Diagnosis Date  . Allergic rhinitis   . Bronchitis, mucopurulent recurrent   . Pulmonary fibrosis   . Lumbar back pain   . Osteopenia   . Arteriosclerotic dementia   . Paresthesia   . Multiple myeloma   . Anxiety   . PONV (postoperative nausea and vomiting)   . DJD (degenerative joint disease)   . Seronegative rheumatoid arthritis     knees  . Adenocarcinoma of pancreas     in remission   Related Meds:  Scheduled Meds:   . donepezil  5 mg Oral Daily  . megestrol  20 mg Oral Daily  . pantoprazole  40 mg Oral Q1200  . piperacillin-tazobactam (ZOSYN)  IV  3.375 g Intravenous Q8H  . predniSONE  5 mg Oral Q breakfast  . vancomycin  750 mg Intravenous Q12H  . DISCONTD: cefTRIAXone (ROCEPHIN)  IV  1 g Intravenous Q24H   Continuous Infusions:   . sodium chloride 125 mL/hr at 04/10/11 0941   PRN Meds:.acetaminophen, acetaminophen, alum & mag hydroxide-simeth, HYDROcodone-acetaminophen, ondansetron (ZOFRAN) IV, ondansetron, promethazine, simethicone, DISCONTD: promethazine  Ht: 5\' 4"  (162.6 cm)  Wt: 158 lb (71.668 kg) with anasarca and 1+ generalized edema  Ideal Wt: 54.5kg % Ideal Wt: 131  Usual Wt: 68.2-72.7kg % Usual Wt: 98-105  Body mass index is 27.12 kg/(m^2).  Food/Nutrition Related Hx: Daughter reports pt with poor appetite since going home from the hospital on 03/28/11. She states that pt has been on Megace since November 2011 without improvement. Pt with history of pancreatic cancer s/p chemotherapy. She states pt drinks 1-2 Boost/day but hasn't been doing much of any intake recently r/t nausea/vomiting. She states pt was having diarrhea at discharge but improved at home on Imodium. She reports pt had multiple episodes of vomiting over the course of 11:30am-7:30pm yesterday until NG  was placed and 2 liter of brown fluid was drained. She states that when pt was eating, she did not have any difficulty chewing or swallowing. Pt currently denies any nausea and states she feels a little better since NGT placed. Pt prefers to have foods that are not cold and without ice. Pt with anasarca and 1+ RUE, RLE, LUE, and LLE edema.   DG of abdomen on 1/26 showed paucity of bowel gas noted. Internal biliary wall stent remains in place.Pt had biliary obstruction secondary to pancreatic cancer.   Labs:  CMP     Component Value Date/Time   NA 133* 04/10/2011 0300   K 3.7 04/10/2011 0300   CL 101 04/10/2011 0300   CO2 18* 04/10/2011 0300   GLUCOSE 138* 04/10/2011 0300   BUN 20 04/10/2011 0300   CREATININE 0.55 04/10/2011 0300   CALCIUM 8.9 04/10/2011 0300   PROT 6.4 04/10/2011 0300   ALBUMIN 2.4* 04/10/2011 0300   AST 16 04/10/2011 0300   ALT 19 04/10/2011 0300   ALKPHOS 227* 04/10/2011 0300   BILITOT 0.8 04/10/2011 0300   GFRNONAA 83* 04/10/2011 0300   GFRAA >90 04/10/2011 0300   - Noted elevated Alk Phos likely r/t liver metastasis per MD notes  Intake/Output Summary (Last 24 hours) at 04/11/11 1245 Last data filed at 04/11/11 1100  Gross per 24 hour  Intake   3875 ml  Output   4000 ml  Net   -125 ml   NGT:  3.7L in total of brown drainage from yesterday's day and night shift   Diet Order: General  IVF:    sodium chloride Last Rate: 125 mL/hr at 04/10/11 0941    Estimated Nutritional Needs:   Kcal:1500-1750 Protein:75-85g Fluid:1.5-1.7L  NUTRITION DIAGNOSIS: -Inadequate oral intake (NI-2.1).  Status: Ongoing -Pt meets criteria for severe PCM of acute illness AEB <50% energy intake for the past 2 weeks with fluid accumulation of anasarca and 1+ generalized edema  RELATED TO: nausea/vomiting   AS EVIDENCE BY: pt and family statement, H&P, NG tube  MONITORING/EVALUATION(Goals): 1. Less than 1 liter of NGT output 2. Pt to consume >50% of meals as pt able to safely eat with  NGT.   EDUCATION NEEDS: -No education needs identified at this time  INTERVENTION: Will monitor NGT output and meal intake.   Dietitian #: [phone]  DOCUMENTATION CODES Per approved criteria  -Severe malnutrition in the context of acute illness or injury    Marshall Cork 04/11/2011, 12:32 PM

## 2011-04-11 NOTE — Progress Notes (Signed)
Initial visit in response to spiritual care consult.    Provided support to pt and daughter at bedside.  Daughter expressed exhaustion and frustration with coordinating her mother's care.  Daughter is from [place] and has not been home in two months.  Expressed feeling alone in her mother's care, and would rather take all responsibility for caring for her mother than to risk someone doing it poorly.  However, she reported it is difficult to transport mother to appointments and provide the care she needs in the home (i.e. Lifting her from the floor).   Provided emotional and spiritual support and empathic presence for daughter and pt and prayed with family at bedside.  Pt and daughter are very religious [employer] Knuckles) and cope through their faith.  Explored with daughter the root of her feelings of care and responsibility and options for finding rest and help with mother's care.  Will continue to provide support during admission.     04/11/11 1500  Clinical Encounter Type  Visited With Patient and family together  Visit Type Initial;Spiritual support;Psychological support  Referral From Nurse  Consult/Referral To Chaplain  Stress Factors  Family Stress Factors Health changes;Major life changes;Lack of caregivers;Exhausted

## 2011-04-11 NOTE — Evaluation (Signed)
Occupational Therapy Evaluation Patient Details Name: Heather Dorsey MRN: [id number] DOB: 1923/07/16 Today's Date: 04/11/2011  Problem List:  Patient Active Problem List  Diagnoses  . MULTIPLE  MYELOMA  . ARTERIOSCLEROTIC DEMENTIA  . ANXIETY  . ALLERGIC RHINITIS  . BRONCHITIS, RECURRENT  . PULMONARY FIBROSIS  . SERONEGATIVE RHEUMATOID ARTHRITIS  . DEGENERATIVE JOINT DISEASE  . BACK PAIN, LUMBAR  . OSTEOPENIA  . PARESTHESIA  . ADENOCARCINOMA, PANCREAS  . Facial swelling  . UTI (lower urinary tract infection)  . Cataracts, bilateral  . DVT of upper extremity (deep vein thrombosis)  . GI bleeding  . Abnormal LFTs  . Anemia of chronic disease    Past Medical History:  Past Medical History  Diagnosis Date  . Allergic rhinitis   . Bronchitis, mucopurulent recurrent   . Pulmonary fibrosis   . Lumbar back pain   . Osteopenia   . Arteriosclerotic dementia   . Paresthesia   . Multiple myeloma   . Anxiety   . PONV (postoperative nausea and vomiting)   . DJD (degenerative joint disease)   . Seronegative rheumatoid arthritis     knees  . Adenocarcinoma of pancreas     in remission   Past Surgical History:  Past Surgical History  Procedure Date  . Appendectomy   . Abdominal hysterectomy   . Common dile duct stent 02/2010    for pancreatic cancer  . Colonoscopy 03/24/2011    Procedure: COLONOSCOPY;  Surgeon: Theda Belfast, MD;  Location: [facility];  Service: Endoscopy;  Laterality: N/A;  Time: 8:52-9:43am Ev II; 2 Indirect  OT Assessment/Plan/Recommendation OT Assessment Clinical Impression Statement: Pt currently presents with over deconditioning/weakness resulting in decreased ability to independently perform ADL and selfcare tasks. LB ADL's=Mod-Max A; UB ADL's=Min-Mod A & VC's for safety, sequencing and hand placement. She would benefit from acute OT followed by Rehab @ SNF level if family agreeable to this. (Rec Tub bench if pt goes home w/ 24 hour Min-Max A level  & HHOT). OT Recommendation/Assessment: Patient will need skilled OT in the acute care venue OT Problem List: Decreased strength;Decreased activity tolerance;Decreased knowledge of use of DME or AE;Decreased cognition;Decreased safety awareness Barriers to Discharge: Decreased caregiver support OT Therapy Diagnosis : Generalized weakness;Altered mental status OT Plan OT Frequency: Min 2X/week OT Treatment/Interventions: Self-care/ADL training;Therapeutic activities;Energy conservation;DME and/or AE instruction;Patient/family education OT Recommendation Follow Up Recommendations: Skilled nursing facility Equipment Recommended: Tub/shower seat;Defer to next venue Individuals Consulted Consulted and Agree with Results and Recommendations: Family member/caregiver Family Member Consulted: Pt & pts daughter OT Goals Acute Rehab OT Goals OT Goal Formulation: With patient/family Time For Goal Achievement: 2 weeks ADL Goals Pt Will Perform Grooming: with set-up;with modified independence;Sitting, edge of bed ADL Goal: Grooming - Progress: Goal set today Pt Will Perform Upper Body Bathing: with set-up;with modified independence;Sitting, edge of bed;Sitting, chair ADL Goal: Upper Body Bathing - Progress: Goal set today Pt Will Perform Lower Body Bathing: with set-up;Sitting, edge of bed;Sitting, chair;with adaptive equipment;with supervision ADL Goal: Lower Body Bathing - Progress: Goal set today Pt Will Perform Upper Body Dressing: with modified independence;with set-up;Sitting, chair;Sitting, bed;Unsupported ADL Goal: Upper Body Dressing - Progress: Goal set today Pt Will Perform Lower Body Dressing: with supervision;Sit to stand from chair;Sit to stand from bed;with adaptive equipment ADL Goal: Lower Body Dressing - Progress: Goal set today Pt Will Transfer to Toilet: with supervision;Ambulation;with DME;Comfort height toilet;3-in-1 ADL Goal: Toilet Transfer - Progress: Goal set today Pt Will  Perform Toileting - Clothing Manipulation:  with supervision;Standing ADL Goal: Toileting - Clothing Manipulation - Progress: Goal set today Pt Will Perform Toileting - Hygiene: with supervision;Sitting on 3-in-1 or toilet ADL Goal: Toileting - Hygiene - Progress: Goal set today  OT Evaluation Precautions/Restrictions  Precautions Precautions: Fall Restrictions Weight Bearing Restrictions: No Prior Functioning Home Living Lives With: Alone;Other (Comment) (Daughter staying with her) Receives Help From: Family Type of Home: House Home Layout: One level Home Access: Stairs to enter Entrance Stairs-Rails: Right Entrance Stairs-Number of Steps: 4 Bathroom Shower/Tub: Engineer, manufacturing systems: Standard Bathroom Accessibility: Yes How Accessible: Accessible via walker Home Adaptive Equipment: Bedside commode/3-in-1;Walker - rolling;Straight cane;Shower chair with back Additional Comments: Daughter states pt would benefit from tub bench secondary to decreased mobility and increased difficulty step into tub to shower chair, not safe at this time. Prior Function Level of Independence: Requires assistive device for independence;Independent with basic ADLs;Needs assistance with homemaking;Independent with transfers ADL ADL Eating/Feeding: Simulated;Set up Eating/Feeding Details (indicate cue type and reason): VC's and sequencing Where Assessed - Eating/Feeding: Bed level Grooming: Performed;Supervision/safety;Set up Grooming Details (indicate cue type and reason): VC's for hand placement & sequencing Where Assessed - Grooming: Sitting, chair;Sitting, bed;Supine, head of bed up Upper Body Bathing: Simulated;Minimal assistance;Moderate assistance;Set up Upper Body Bathing Details (indicate cue type and reason): Increased time, pt requires rest breaks and step by step instruction Where Assessed - Upper Body Bathing: Supine, head of bed up;Sitting, bed;Sitting, chair Lower Body Bathing:  Simulated;Moderate assistance;Set up Where Assessed - Lower Body Bathing: Sitting, chair;Sitting, bed;Supported Upper Body Dressing: Simulated;Minimal assistance;Set up Where Assessed - Upper Body Dressing: Sitting, bed Lower Body Dressing: Simulated;Maximal assistance;Moderate assistance Where Assessed - Lower Body Dressing: Sit to stand from chair;Sit to stand from bed Toilet Transfer: Simulated;Moderate assistance Toilet Transfer Method: Stand pivot;Other (comment) (Amb w/ RW approx 8 steps, w/ VC's safety/hand placement) Toilet Transfer Equipment: Bedside commode Toileting - Clothing Manipulation: Simulated;Maximal assistance Toileting - Clothing Manipulation Details (indicate cue type and reason): Dtr also reports she has assisted w/ depends at home recently Where Assessed - Glass blower/designer Manipulation: Standing Toileting - Hygiene: Simulated;Moderate assistance Where Assessed - Toileting Hygiene: Sit on 3-in-1 or toilet Tub/Shower Transfer: Not assessed Equipment Used: Rolling walker Ambulation Related to ADLs: Pt requires VC's for safety and sequencing with RW noted. ADL Comments: Pt currently presents with over deconditioning/weakness resulting in decreased ability to independently perform ADL and selfcare tasks. LB ADL's=Mod-Max A; UB ADL's=Min-Mod A & VC's for safety, sequencing and hand placement. She would benefit from acute OT followed by Rehab @ SNF level if family agreeable to this. (Rec Tub bench if pt goes home w/ 24 hour Min-Max A level & HHOT). Vision/Perception  Vision - History Baseline Vision: Wears glasses only for reading Visual History: Cataracts Patient Visual Report: No change from baseline Cognition Cognition Arousal/Alertness: Awake/alert Overall Cognitive Status: Impaired Orientation Level: Oriented to person;Oriented to place;Disoriented to time;Disoriented to situation Sensation/Coordination Sensation Light Touch: Appears Intact Coordination Gross  Motor Movements are Fluid and Coordinated: Yes (Pt moves very slowly) Fine Motor Movements are Fluid and Coordinated: Yes (Pt moves very slowly/requires increased time for tasks) Extremity Assessment RUE Assessment RUE Assessment: Within Functional Limits LUE Assessment LUE Assessment: Within Functional Limits Mobility  Bed Mobility Bed Mobility: Yes Supine to Sit: HOB elevated (Comment degrees);With rails Sitting - Scoot to Edge of Bed: 5: Supervision;With rail;4: Min assist Sitting - Scoot to Edge of Bed Details (indicate cue type and reason): Increased time & VC's for scoot to EOB Transfers Sit  to Stand: 4: Min assist;From elevated surface;With upper extremity assist;From chair/3-in-1;From bed Sit to Stand Details (indicate cue type and reason): VC's for safety and sequencing w/ RW & hand placement Stand to Sit: 4: Min assist;With upper extremity assist;To chair/3-in-1 Stand to Sit Details: VC's for safety and sequencing and hand placement - to reach for chair/armrests   End of Session OT - End of Session Equipment Utilized During Treatment: Gait belt;Other (comment) (RW) Activity Tolerance: Patient tolerated treatment well;Patient limited by fatigue Patient left: in chair;with call bell in reach;with family/visitor present Nurse Communication: Other (comment) (RN Tech educated in [place] status for transfers) General Behavior During Session: [facility] for tasks performed Cognition: Impaired   Charletta Cousin, Jaquana Geiger Beth Dixon 04/11/2011, 10:09 AM

## 2011-04-12 ENCOUNTER — Other Ambulatory Visit: Payer: PRIVATE HEALTH INSURANCE | Admitting: Lab

## 2011-04-12 ENCOUNTER — Ambulatory Visit: Payer: PRIVATE HEALTH INSURANCE | Admitting: Physician Assistant

## 2011-04-12 LAB — DIFFERENTIAL
Eosinophils Absolute: 0.1 10*3/uL (ref 0.0–0.7)
Eosinophils Relative: 2 % (ref 0–5)
Lymphs Abs: 0.3 10*3/uL — ABNORMAL LOW (ref 0.7–4.0)
Monocytes Absolute: 0.3 10*3/uL (ref 0.1–1.0)
Monocytes Relative: 5 % (ref 3–12)

## 2011-04-12 LAB — BASIC METABOLIC PANEL
BUN: 19 mg/dL (ref 6–23)
Calcium: 8.3 mg/dL — ABNORMAL LOW (ref 8.4–10.5)
GFR calc non Af Amer: 74 mL/min — ABNORMAL LOW (ref >90)
Glucose, Bld: 71 mg/dL (ref 70–99)

## 2011-04-12 LAB — URINE CULTURE

## 2011-04-12 LAB — CBC
HCT: 24.2 % — ABNORMAL LOW (ref 36.0–46.0)
Hemoglobin: 8.3 g/dL — ABNORMAL LOW (ref 12.0–15.0)
MCH: 29.6 pg (ref 26.0–34.0)
MCV: 86.4 fL (ref 78.0–100.0)
Platelets: 276 10*3/uL (ref 150–400)
RBC: 2.8 MIL/uL — ABNORMAL LOW (ref 3.87–5.11)

## 2011-04-12 MED ORDER — PHENOL 1.4 % MT LIQD
1.0000 | OROMUCOSAL | Status: DC | PRN
  Filled 2011-04-12: qty 177

## 2011-04-12 MED ORDER — LEVOFLOXACIN 750 MG PO TABS
750.0000 mg | ORAL_TABLET | Freq: Every day | ORAL | Status: DC
  Administered 2011-04-12 – 2011-04-14 (×3): 750 mg via ORAL
  Filled 2011-04-12 (×3): qty 1

## 2011-04-12 NOTE — Progress Notes (Signed)
During rounding, it was discovered that patient had pulled out her NG tube, although she denies knowing how it became dislodged.  MD notified.  Per MD, okay to leave tube out until he rounds.  No bowel sounds auscultated, no bowel movement in the last several days.  Patient is requesting food; new orders for clear liquids received and processed.  Will continue to monitor.

## 2011-04-12 NOTE — Progress Notes (Signed)
PROGRESS NOTE  Heather Dorsey EAV:[id number] DOB: 1923-04-15 DOA: 04/09/2011 PCP: Michele Mcalpine, MD, MD  Brief narrative: 78 yo aaf known [place] Pacnreas, S/p Gemcitabine chemo underlying pulm fibrosis, DJD & LBP, Osteopenia, mild dementia, and Monoclonal Gammopathy/ smoldering myeloma per Dr. Dalene Carrow.  Began to have diarrhea at home, thought secondary to supplements and then started to have vomiting and decreased PO intake.  Took Imodium at primary care physician's request and then stopped having diarrhea  Went to see Dr. Aretta Nip in his office some people had a"bug '  Started to have a cough, took hydrocodeine. Increasing abdominal pain + chest pain with radiation to the back as well . ++Nausea.   Admitted January 8-14 for GI bleed. This occurred in the context of warfarin for a basilic vein DVT. She underwent colonoscopy that time which revealed multiple polyps. CT of the abdomen at that time revealed recurrent pancreatic cancer with some pulmonary nodules consistent with metastatic disease. Anticoagulation was discontinued as repeat imaging demonstrated resolution of DVT   Past medical history: Per above  Consultants:  Oncology-Dr. Rolm Baptise  Procedures:  CT scan abdomen ordered 1/29-pending  Abdominal x-ray = no overt small bowel obstruction  Antibiotics:  Rocephin 1/27-1/28  Vancomycin 1/28  Zosyn 1/28  Levaquin 1/29   Subjective  . No other complaints-confused today to daughter. Thought her daughter was her sister thought that she was at a friend's home. Cannot have a meaningful conversation  Cannot tell me the date or day season. Knows that Fay Records is the president.  Daughter in room with multiple questions  Interim History: NG tube placed yielding 4 L of bilious emesis 1/28 NG came out-tolerating clears 1/29 Oliguric still   Objective      Objective: Filed Vitals:   04/11/11 1338 04/11/11 2112 04/12/11 0529 04/12/11 1355  BP: 98/64 112/75  113/73 101/70  Pulse: 104 95 102 92  Temp: 98.5 F (36.9 C) 98.4 F (36.9 C) 98.5 F (36.9 C) 97.8 F (36.6 C)  TempSrc: Oral Oral Oral Oral  Resp: 18 18 18 18   Height:      Weight:      SpO2: 95% 100% 95% 95%   No intake or output data in the 24 hours ending 04/12/11 1757  Exam:  General: Alert oriented to person and place only. NG tube in place no icterus no pallor Cardiovascular: S1-S2 no murmur rub or gallop.  Regular rate and rhythm Respiratory: Clinically clear no added sound Abdomen: Bowel sounds decreased Skin soft left lower extremity might have some edema Neuro oriented to person and place only  Data Reviewed: Basic Metabolic Panel:  Lab 04/12/11 7829 04/10/11 0300 04/09/11 1220  NA 140 133* 131*  K 3.1* 3.7 --  CL 110 101 97  CO2 16* 18* 16*  GLUCOSE 71 138* 175*  BUN 19 20 20   CREATININE 0.78 0.55 0.70  CALCIUM 8.3* 8.9 9.6  MG -- -- --  PHOS -- -- --   Liver Function Tests:  Lab 04/10/11 0300 04/09/11 1220  AST 16 28  ALT 19 24  ALKPHOS 227* 271*  BILITOT 0.8 1.2  PROT 6.4 7.2  ALBUMIN 2.4* 2.8*    Lab 04/09/11 1220  LIPASE 11  AMYLASE --   No results found for this basename: AMMONIA:5 in the last 168 hours CBC:  Lab 04/12/11 0548 04/10/11 2008 04/10/11 0500 04/09/11 1220  WBC 5.5 13.6* 12.5* 11.0*  NEUTROABS 4.8 -- -- 10.3*  HGB 8.3* 8.7* 9.3* 10.2*  HCT 24.2*  24.9* 26.8* 29.2*  MCV 86.4 85.6 85.6 87.4  PLT 276 255 297 282   Cardiac Enzymes:  Lab 04/09/11 1220  CKTOTAL 55  CKMB 2.1  CKMBINDEX --  TROPONINI <0.30   BNP: No components found with this basename: POCBNP:5 CBG: No results found for this basename: GLUCAP:5 in the last 168 hours  Recent Results (from the past 240 hour(s))  URINE CULTURE     Status: Normal   Collection Time   04/09/11 12:37 PM      Component Value Range Status Comment   Specimen Description URINE, CATHETERIZED   Final    Special Requests NONE   Final    Culture  Setup Time [id number]   Final      Colony Count >=100,000 COLONIES/ML   Final    Culture     Final    Value: ESCHERICHIA COLI     KLEBSIELLA PNEUMONIAE   Report Status 04/12/2011 FINAL   Final    Organism ID, Bacteria ESCHERICHIA COLI   Final    Organism ID, Bacteria KLEBSIELLA PNEUMONIAE   Final      Studies: Ct Abdomen Pelvis Wo Contrast  04/11/2011  *RADIOLOGY REPORT*  Clinical Data: Adenocarcinoma of the pancreas for which the patient is undergoing chemotherapy.  Diarrhea.  Nausea and vomiting. Anorexia.  Lower abdominal pain.  CT ABDOMEN AND PELVIS WITHOUT CONTRAST 04/11/2011:  Technique:  Multidetector CT imaging of the abdomen and pelvis was performed following the standard protocol without intravenous contrast.  Comparison: CT abdomen pelvis with contrast 03/22/2011.  Findings: Diffuse body wall edema.  Interval increase in the amount of ascites within the abdomen and pelvis, now moderately large. Stable moderately distended gallbladder.  No intra or extrahepatic biliary ductal dilation.  Indwelling common bile duct stent which it is likely patent, as there is gas within the intrahepatic bile ducts.  Mass involving the head of the pancreas, unchanged, with atrophy involving the body and tail.  Adenopathy within the celiac axis, unchanged.  Low attenuation involving the right and left lobes centrally, unchanged.  Within the limits of the unenhanced technique, no new hepatic parenchymal abnormalities.  Normal appearing spleen. Normal unenhanced appearance of the kidneys without evidence of hydronephrosis.  Moderate aorto-iliofemoral atherosclerosis without aneurysm.  Nasogastric tube within the stomach which is decompressed and unremarkable.  Normal-appearing small bowel.  Relatively decompressed colon throughout, with persistent wall thickening involving the cecum, ascending colon, transverse colon, and descending colon.  Urinary bladder unremarkable.  Bone window images again demonstrate a mottled appearance to the thoracic and  lumbar spine, degenerative changes involving the lower thoracic and lower lumbar spine, and mild degenerative changes in the hips.  Small left pleural effusion with associated passive atelectasis in the left lower lobe.  Vague nodular opacity in the right middle lobe, visualized on the initial image, unchanged.  IMPRESSION:  1.  Interval increase in the amount of ascites within the abdomen and pelvis since the prior CT 03/22/2011. 2.  Otherwise, no significant interval change. 3.  Residual or recurrent colitis. 4.  Indwelling common bile duct stent which appears patent, as there is gas within the intrahepatic bile ducts. 5.  Stable mass involving the head of the pancreas and metastatic the celiac axis lymphadenopathy. 6.  Small left pleural effusion and associated passive atelectasis in the left lower lobe.  Stable vague oval shaped nodular opacity in the right middle lobe. 7.  Anasarca.  Original Report Authenticated By: Arnell Sieving, M.D.   Dg Abd Acute W/chest  04/10/2011  *RADIOLOGY REPORT*  Clinical Data: Abdominal pain.  Distention.  ACUTE ABDOMEN SERIES (ABDOMEN 2 VIEW & CHEST 1 VIEW)  Comparison: 04/09/2011  Findings: Right-sided power port noted with tip projecting over the SVC.  A nasogastric tube is present.  Very low lung volumes are present with airspace opacity in the lung bases favoring atelectasis.  A left side down lateral decubitus view of the abdomen does not demonstrate free intraperitoneal gas.  A biliary stent is observed. Air-fluid level in an unidentifiable loop of central abdominal bowel could be in the stomach, small bowel, or colon.  Most of the bowel is gasless.  IMPRESSION:  1.  Predominately gasless bowel.  No definite dilated bowel is noted.  A single air fluid level on the lateral decubitus view the abdomen is in unidentifiable bowel, possibly colon, stomach, or small bowel. 2.  Biliary stent. 3.  Very low lung volumes, with bibasilar airspace opacities favoring atelectasis  over pneumonia.  Original Report Authenticated By: Dellia Cloud, M.D.     Scheduled Meds:    . donepezil  5 mg Oral Daily  . pantoprazole sodium  40 mg Oral Q1200  . piperacillin-tazobactam (ZOSYN)  IV  3.375 g Intravenous Q8H  . predniSONE  5 mg Oral Q breakfast  . vancomycin  750 mg Intravenous Q12H   Continuous Infusions:    . sodium chloride 125 mL/hr at 04/12/11 1441     Assessment/Plan  1. E. Coli/Klebsiella UTI: Empiric antibiotics with vancomycin and Zosyn day #3 received one day of Rocephin. White count now decreased. Stop IV antibiotics. Start Levaquin see problem #3 2. Acute metabolic encephalopathy: Secondary to UTI. Superimposed on chronic dementia 3. Pneumonia on chest x-ray: Vague opacity right middle lobe-DDX= pneumonia versus metastases of primary adenocarcinoma pancreas. Would switch to Levaquin which should cover both.  Would restart IV antibiotics if CBC shows indices of infection   4. N/V/diarrhea: Resolved.  Keep NG out. GI consulted 1/28. Daughter states that patient was supposed to have capsule endoscopy coordinated. Unclear what utility that would be.   5. GERD: Continue PPI. Mylanta. 6. Hyponatremia: Secondary to dehydration most likely. Repeat basic metabolic panel in the morning. I 7. Dehydration: IV fluids were weaned to off.  Clear liquid diet 8. Abnormal LFTs: Possibly secondary to liver metastasis. Continue to follow. Discontinue Tylenol 9. Recent lower GI bleed secondary to warfarin/polyps: No anticoagulants. 10. Known pancreatic cancer: Repeat CT scan 1/29 = definituve celiac head mass+ celiac plexus lymphadenopathy. Start Lasix for ascites. 11. History biliary obstruction secondary to pancreatic cancer: Continue to monitor. 12. Multiple myeloma-per oncologist. Has bony erosions 13. Rheumatoid arthritis-stable at present. Any prednisone 14. Recent DVT on Coumadin-noncoagulation currently secondary to be GI bleed  Long discussion of about  one hour with daughter to determine course of future care. Daughter has numerous concerns including whether this is actually pancreatic cancer and oncology Dr. Truett Perna has weighed in. The CT scan does show pancreatic cancer however given her multiple other medical conditions (i.e. recent UTI, GI bleed, and other multiple medical issues feeling moderate to severe dementia) her ECoG score is very poor.   I did discuss with her primary care physician Alroy Dust the unfortunate sequence of events and have requested his kind assistance in helping [place] determine the best course of this complicated lady's care. He will arrange him to meet with her tomorrow which has been set at 1 PM. [facility] will definitely still continue to be primary in her care however I  believe that daughter's wishes are to pursue hospice at home. She requests just one more visit to oncologist in [facility].  > 1 hour 15 minutes coordinating this note  Code Status: Full code confirmed with daughter at bedside.  Be readdressed in a.m.  Family Communication: Daughter  Disposition Plan: Pending further evaluation and treatment. Daughter wishes to take him home eventually with hospice    Pleas Koch, MD  [facility] Pager [phone] 04/12/2011, 5:57 PM    LOS: 3 days

## 2011-04-12 NOTE — Progress Notes (Signed)
Patient has had belching and has coughed up small amounts of whitish phlegm following a fairly successful meal of clear liquids.  Phlegm coughing spells have occurred 4 to 5 times.

## 2011-04-12 NOTE — Progress Notes (Signed)
Daughter Franklin Sink states that patient has a scheduled appt. In [place] on Friday, 2/1 with her regular oncologist.  She wishes to know if her mother will be able to keep that appointment.  This nurse advised her that Hospitalist would be responsible for discharge, and that it was not necessary to call Dr. Kalman Drape office at this time.  Daughter was encouraged to wait until Hospitalist service rounds to discuss possible discharge date.

## 2011-04-12 NOTE — Progress Notes (Signed)
IP PROGRESS NOTE  Subjective:   She complains of sore throat. No bowel movement.  Objective: Vital signs in last 24 hours: Blood pressure 113/73, pulse 102, temperature 98.5 F (36.9 C), temperature source Oral, resp. rate 18, height 5\' 4"  (1.626 m), weight 158 lb (71.668 kg), SpO2 95.00%.  Intake/Output from previous day: 01/28 0701 - 01/29 0700 In: 60 [NG/GT:60] Out: 50 [Emesis/NG output:50]  Physical Exam:  HEENT: No thrush Lungs: Inspiratory rhonchi at the low posterior chest bilaterally, right greater than left Cardiac: Regular rate and rhythm Abdomen: Soft , mild diffuse tenderness Extremities: Pitting edema in the legs/feet bilaterally   Portacath/PICC-without erythema  Lab Results:  [facility] 04/12/11 0548 04/10/11 2008  WBC 5.5 13.6*  HGB 8.3* 8.7*  HCT 24.2* 24.9*  PLT 276 255    BMET  Basename 04/12/11 0548 04/10/11 0300  NA 140 133*  K 3.1* 3.7  CL 110 101  CO2 16* 18*  GLUCOSE 71 138*  BUN 19 20  CREATININE 0.78 0.55  CALCIUM 8.3* 8.9    Studies/Results: Ct Abdomen Pelvis Wo Contrast  04/11/2011  *RADIOLOGY REPORT*  Clinical Data: Adenocarcinoma of the pancreas for which the patient is undergoing chemotherapy.  Diarrhea.  Nausea and vomiting. Anorexia.  Lower abdominal pain.  CT ABDOMEN AND PELVIS WITHOUT CONTRAST 04/11/2011:  Technique:  Multidetector CT imaging of the abdomen and pelvis was performed following the standard protocol without intravenous contrast.  Comparison: CT abdomen pelvis with contrast 03/22/2011.  Findings: Diffuse body wall edema.  Interval increase in the amount of ascites within the abdomen and pelvis, now moderately large. Stable moderately distended gallbladder.  No intra or extrahepatic biliary ductal dilation.  Indwelling common bile duct stent which it is likely patent, as there is gas within the intrahepatic bile ducts.  Mass involving the head of the pancreas, unchanged, with atrophy involving the body and tail.   Adenopathy within the celiac axis, unchanged.  Low attenuation involving the right and left lobes centrally, unchanged.  Within the limits of the unenhanced technique, no new hepatic parenchymal abnormalities.  Normal appearing spleen. Normal unenhanced appearance of the kidneys without evidence of hydronephrosis.  Moderate aorto-iliofemoral atherosclerosis without aneurysm.  Nasogastric tube within the stomach which is decompressed and unremarkable.  Normal-appearing small bowel.  Relatively decompressed colon throughout, with persistent wall thickening involving the cecum, ascending colon, transverse colon, and descending colon.  Urinary bladder unremarkable.  Bone window images again demonstrate a mottled appearance to the thoracic and lumbar spine, degenerative changes involving the lower thoracic and lower lumbar spine, and mild degenerative changes in the hips.  Small left pleural effusion with associated passive atelectasis in the left lower lobe.  Vague nodular opacity in the right middle lobe, visualized on the initial image, unchanged.  IMPRESSION:  1.  Interval increase in the amount of ascites within the abdomen and pelvis since the prior CT 03/22/2011. 2.  Otherwise, no significant interval change. 3.  Residual or recurrent colitis. 4.  Indwelling common bile duct stent which appears patent, as there is gas within the intrahepatic bile ducts. 5.  Stable mass involving the head of the pancreas and metastatic the celiac axis lymphadenopathy. 6.  Small left pleural effusion and associated passive atelectasis in the left lower lobe.  Stable vague oval shaped nodular opacity in the right middle lobe. 7.  Anasarca.  Original Report Authenticated By: Arnell Sieving, M.D.   Dg Abd Acute W/chest  04/10/2011  *RADIOLOGY REPORT*  Clinical Data: Abdominal pain.  Distention.  ACUTE ABDOMEN SERIES (ABDOMEN 2 VIEW & CHEST 1 VIEW)  Comparison: 04/09/2011  Findings: Right-sided power port noted with tip  projecting over the SVC.  A nasogastric tube is present.  Very low lung volumes are present with airspace opacity in the lung bases favoring atelectasis.  A left side down lateral decubitus view of the abdomen does not demonstrate free intraperitoneal gas.  A biliary stent is observed. Air-fluid level in an unidentifiable loop of central abdominal bowel could be in the stomach, small bowel, or colon.  Most of the bowel is gasless.  IMPRESSION:  1.  Predominately gasless bowel.  No definite dilated bowel is noted.  A single air fluid level on the lateral decubitus view the abdomen is in unidentifiable bowel, possibly colon, stomach, or small bowel. 2.  Biliary stent. 3.  Very low lung volumes, with bibasilar airspace opacities favoring atelectasis over pneumonia.  Original Report Authenticated By: Dellia Cloud, M.D.    Medications: I have reviewed the patient's current medications.  Assessment/Plan:    1. History of locally advanced pancreas cancer, currently maintained off of specific therapy.  2. Admission with nausea/vomiting and abdominal pain-potentially related to the pancreas cancer, a viral infection, the urinary tract infection, or another GI process. CT of the abdomen on 04/03/2011 does not suggest a bowel obstruction.  3. Altered mental status secondary to acute delirium and underlying dementia  4. History of multiple myeloma-review of the January 2013 CT scans is suggestive of myelomatous lesions in the bone marrow and bones  5. Anemia secondary to chronic disease and recent bleeding  6. History of rheumatoid arthritis  7. Abdominal pain-likely related to pancreas cancer  8. Urinary tract infection  9. Increased ascites on the CT 04/03/2011-potentially related to anasarca versus carcinomatosis  Recommendations: -Continue supportive care measures  -She has multiple comorbid conditions and a very poor performance status. She appears to be a candidate for hospice care. I  plan to discuss this with her daughter after review of the imaging studies. She is not a candidate for systemic chemotherapy at present.  -We can consider a diagnostic paracentesis to confirm a diagnosis of advanced stage pancreas cancer.  -Consider trial of clamping the NG tube.    LOS: 3 days   Bobbye Reinitz BRADLEY  04/12/2011, 8:47 AM

## 2011-04-13 LAB — CBC
MCH: 29.9 pg (ref 26.0–34.0)
MCHC: 34.3 g/dL (ref 30.0–36.0)
MCV: 87.3 fL (ref 78.0–100.0)
Platelets: 294 10*3/uL (ref 150–400)

## 2011-04-13 LAB — MAGNESIUM: Magnesium: 2.4 mg/dL (ref 1.5–2.5)

## 2011-04-13 LAB — BASIC METABOLIC PANEL
Calcium: 8.9 mg/dL (ref 8.4–10.5)
Creatinine, Ser: 0.91 mg/dL (ref 0.50–1.10)
GFR calc non Af Amer: 56 mL/min — ABNORMAL LOW (ref >90)
Glucose, Bld: 99 mg/dL (ref 70–99)
Sodium: 141 mEq/L (ref 135–145)

## 2011-04-13 MED ORDER — POTASSIUM CHLORIDE 10 MEQ/100ML IV SOLN
10.0000 meq | INTRAVENOUS | Status: AC
  Administered 2011-04-13 (×4): 10 meq via INTRAVENOUS
  Filled 2011-04-13 (×4): qty 100

## 2011-04-13 NOTE — Progress Notes (Addendum)
INterval H/o: 78 yo aaf known [place] Pacnreas, S/p Gemcitabine chemo underlying pulm fibrosis, DJD & LBP, Osteopenia, mild dementia, and Monoclonal Gammopathy/ smoldering myeloma per Dr. Dalene Carrow.  Began to have diarrhea at home, thought secondary to supplements and then started to have vomiting and decreased PO intake.  Took Imodium at primary care physician's request and then stopped having diarrhea  Went to see Dr. Aretta Nip in his office some people had a"bug '  Started to have a cough, took hydrocodeine. Increasing abdominal pain + chest pain with radiation to the back as well . ++Nausea.   Admitted January 8-14 for GI bleed. This occurred in the context of warfarin for a basilic vein DVT. She underwent colonoscopy that time which revealed multiple polyps. CT of the abdomen at that time revealed recurrent pancreatic cancer with some pulmonary nodules consistent with metastatic disease. Anticoagulation was discontinued as repeat imaging demonstrated resolution of DVT  Antibiotics:  Rocephin 1/27-1/28  Vancomycin 1/28  Zosyn 1/28  Levaquin 1/29   Subjective  . No other complaints-confused today to daughter. Thought her daughter was her sister thought that she was at a friend's home. Cannot have a meaningful conversation  Cannot tell me the date or day season. Knows that Fay Records is the president.  Daughter in room with multiple questions  Interim History: NG tube placed yielding 4 L of bilious emesis 1/28 NG came out-tolerating clears 1/29 Oliguric still   Objective    Objective: Filed Vitals:   04/12/11 2100 04/13/11 0330 04/13/11 0615 04/13/11 1403  BP: 92/62 130/69 96/67 106/65  Pulse: 93 104 62 100  Temp: 98.7 F (37.1 C) 98.8 F (37.1 C) 98.6 F (37 C) 97.9 F (36.6 C)  TempSrc: Oral Oral Oral Oral  Resp: 22 18 18 20   Height:      Weight:      SpO2: 95% 99% 97% 97%   No intake or output data in the 24 hours ending 04/13/11 1553  Exam:  General: Alert  oriented to person and place only.NG tube out. Cardiovascular: S1-S2 no murmur rub or gallop.  Regular rate and rhythm Respiratory: Clinically clear no added sound, good air movement. Abdomen: Bowel sounds decreased. Skin soft left lower extremity might have some edema Neuro oriented to person and place only  Data Reviewed: Basic Metabolic Panel:  Lab 04/13/11 4098 04/12/11 0548 04/10/11 0300 04/09/11 1220  NA 141 140 133* 131*  K 2.9* 3.1* -- --  CL 109 110 101 97  CO2 15* 16* 18* 16*  GLUCOSE 99 71 138* 175*  BUN 21 19 20 20   CREATININE 0.91 0.78 0.55 0.70  CALCIUM 8.9 8.3* 8.9 9.6  MG -- -- -- --  PHOS -- -- -- --   Liver Function Tests:  Lab 04/10/11 0300 04/09/11 1220  AST 16 28  ALT 19 24  ALKPHOS 227* 271*  BILITOT 0.8 1.2  PROT 6.4 7.2  ALBUMIN 2.4* 2.8*    Lab 04/09/11 1220  LIPASE 11  AMYLASE --   No results found for this basename: AMMONIA:5 in the last 168 hours CBC:  Lab 04/13/11 0655 04/12/11 0548 04/10/11 2008 04/10/11 0500 04/09/11 1220  WBC 8.1 5.5 13.6* 12.5* 11.0*  NEUTROABS -- 4.8 -- -- 10.3*  HGB 8.5* 8.3* 8.7* 9.3* 10.2*  HCT 24.8* 24.2* 24.9* 26.8* 29.2*  MCV 87.3 86.4 85.6 85.6 87.4  PLT 294 276 255 297 282   Cardiac Enzymes:  Lab 04/09/11 1220  CKTOTAL 55  CKMB 2.1  CKMBINDEX --  TROPONINI <0.30   BNP: No components found with this basename: POCBNP:5 CBG: No results found for this basename: GLUCAP:5 in the last 168 hours  Recent Results (from the past 240 hour(s))  URINE CULTURE     Status: Normal   Collection Time   04/09/11 12:37 PM      Component Value Range Status Comment   Specimen Description URINE, CATHETERIZED   Final    Special Requests NONE   Final    Culture  Setup Time [id number]   Final    Colony Count >=100,000 COLONIES/ML   Final    Culture     Final    Value: ESCHERICHIA COLI     KLEBSIELLA PNEUMONIAE   Report Status 04/12/2011 FINAL   Final    Organism ID, Bacteria ESCHERICHIA COLI   Final     Organism ID, Bacteria KLEBSIELLA PNEUMONIAE   Final      Studies: Ct Abdomen Pelvis Wo Contrast  04/11/2011  *RADIOLOGY REPORT*  Clinical Data: Adenocarcinoma of the pancreas for which the patient is undergoing chemotherapy.  Diarrhea.  Nausea and vomiting. Anorexia.  Lower abdominal pain.  CT ABDOMEN AND PELVIS WITHOUT CONTRAST 04/11/2011:  Technique:  Multidetector CT imaging of the abdomen and pelvis was performed following the standard protocol without intravenous contrast.  Comparison: CT abdomen pelvis with contrast 03/22/2011.  Findings: Diffuse body wall edema.  Interval increase in the amount of ascites within the abdomen and pelvis, now moderately large. Stable moderately distended gallbladder.  No intra or extrahepatic biliary ductal dilation.  Indwelling common bile duct stent which it is likely patent, as there is gas within the intrahepatic bile ducts.  Mass involving the head of the pancreas, unchanged, with atrophy involving the body and tail.  Adenopathy within the celiac axis, unchanged.  Low attenuation involving the right and left lobes centrally, unchanged.  Within the limits of the unenhanced technique, no new hepatic parenchymal abnormalities.  Normal appearing spleen. Normal unenhanced appearance of the kidneys without evidence of hydronephrosis.  Moderate aorto-iliofemoral atherosclerosis without aneurysm.  Nasogastric tube within the stomach which is decompressed and unremarkable.  Normal-appearing small bowel.  Relatively decompressed colon throughout, with persistent wall thickening involving the cecum, ascending colon, transverse colon, and descending colon.  Urinary bladder unremarkable.  Bone window images again demonstrate a mottled appearance to the thoracic and lumbar spine, degenerative changes involving the lower thoracic and lower lumbar spine, and mild degenerative changes in the hips.  Small left pleural effusion with associated passive atelectasis in the left lower lobe.   Vague nodular opacity in the right middle lobe, visualized on the initial image, unchanged.  IMPRESSION:  1.  Interval increase in the amount of ascites within the abdomen and pelvis since the prior CT 03/22/2011. 2.  Otherwise, no significant interval change. 3.  Residual or recurrent colitis. 4.  Indwelling common bile duct stent which appears patent, as there is gas within the intrahepatic bile ducts. 5.  Stable mass involving the head of the pancreas and metastatic the celiac axis lymphadenopathy. 6.  Small left pleural effusion and associated passive atelectasis in the left lower lobe.  Stable vague oval shaped nodular opacity in the right middle lobe. 7.  Anasarca.  Original Report Authenticated By: Arnell Sieving, M.D.   Dg Abd Acute W/chest  04/10/2011  *RADIOLOGY REPORT*  Clinical Data: Abdominal pain.  Distention.  ACUTE ABDOMEN SERIES (ABDOMEN 2 VIEW & CHEST 1 VIEW)  Comparison: 04/09/2011  Findings: Right-sided power port noted with tip  projecting over the SVC.  A nasogastric tube is present.  Very low lung volumes are present with airspace opacity in the lung bases favoring atelectasis.  A left side down lateral decubitus view of the abdomen does not demonstrate free intraperitoneal gas.  A biliary stent is observed. Air-fluid level in an unidentifiable loop of central abdominal bowel could be in the stomach, small bowel, or colon.  Most of the bowel is gasless.  IMPRESSION:  1.  Predominately gasless bowel.  No definite dilated bowel is noted.  A single air fluid level on the lateral decubitus view the abdomen is in unidentifiable bowel, possibly colon, stomach, or small bowel. 2.  Biliary stent. 3.  Very low lung volumes, with bibasilar airspace opacities favoring atelectasis over pneumonia.  Original Report Authenticated By: Dellia Cloud, M.D.     Scheduled Meds:    . donepezil  5 mg Oral Daily  . levofloxacin  750 mg Oral Daily  . pantoprazole sodium  40 mg Oral Q1200  .  predniSONE  5 mg Oral Q breakfast  . DISCONTD: piperacillin-tazobactam (ZOSYN)  IV  3.375 g Intravenous Q8H  . DISCONTD: vancomycin  750 mg Intravenous Q12H   Continuous Infusions:    . DISCONTD: sodium chloride Stopped (04/12/11 1900)     Assessment/Plan  1. E. Coli/Klebsiella OZH:YQMVHQIO started 1.29.2013 afebrile. WBC is down. 2. Acute metabolic encephalopathy: Secondary to UTI. Improved. 3. N/V/diarrhea: Resolved.  Keep NG out.  Daughter states that patient was supposed to have capsule endoscopy coordinated. Unclear what utility that would be.   4. GERD: Continue PPI. Mylanta. 5. Hyponatremia: Secondary to dehydration most likely Resolved 6. Abnormal LFTs: Possibly secondary to liver metastasis, pancreatic obstruction vs vomitting.. Continue to follow.  7. Recent lower GI bleed secondary to warfarin/polyps: No anticoagulants. 8. Known pancreatic cancer: Repeat CT scan 1/29 = definituve celiac head mass+ celiac plexus lymphadenopathy. With significant ascities. 9. History biliary obstruction secondary to pancreatic cancer: Continue to monitor. 10. Multiple myeloma-per oncologist. Has bony erosions 11. Rheumatoid arthritis-stable at present. prednisone 12. Recent DVT on Coumadin-noncoagulation currently secondary to be GI bleed. 13. Hypokalemia replete. Check a magnesium.  Long discussion of about one hour with daughter to determine course of future care. Dr. Jodi Marble d/w the very poor prognosis of the patient. The daughter does seem to understand the big pictures as she get concern about every little laboratory. SHe will like her mom to be transfer to [facility] to be evaluated by Dr. Flora Lipps oncologist. I have called [facility] and awaiting call back.  Alroy Dust talk to the daughter and explained th every poor prognosis of the patient but the daughter keeps referring about her options. This is a very complicated situation as I think her daughter does not understand her mother  condition > 1 hour 15 minutes coordinating this note  Code Status: Full code confirmed with daughter at bedside.  Be readdressed in a.m.  Family Communication: Daughter  Disposition Plan: Pending further evaluation and treatment. Daughter wishes to take him home eventually with hospice      LOS: 4 days

## 2011-04-13 NOTE — Progress Notes (Signed)
Daughter wants another hospitalist. As she think we have reached and impase. Have called Dr. Elnoria Howard GI as she wants a GI consult. Dr. Truett Perna and Dr. Kriste Basque have spoken to the daughter about the patient poor prognosis. But she does not seem to understand. Spoke with Dr. Flora Lipps over at [facility]. He Think she can finish her management of her current condition here at [facility].  But if the daughter insist on being transfer he would be happy to take her into his service.

## 2011-04-13 NOTE — Progress Notes (Signed)
Patient's daughter is very demanding and has frequent requests.  Daughter believes that patient's pain is d/t "acid reflux and fluid build up" she is in denial of the patient's disease progression and has yet to mention that patient's symptoms could be from metastatic cancer.  Throughout the night I have made several visits into the room to check up on the patient and comfort both the patient and daughter.  Daughter frequently comes out of the room to report that her mother is moaning and uncomfortable yet mother is peaceful and resting when I arrive.  The patient has reported pain in her "ribs" that has decreased with oral pain medication.  I will continue to monitor patient frequently and will medicate/provide comfort to patient as needed.

## 2011-04-13 NOTE — Progress Notes (Signed)
IP PROGRESS NOTE  Subjective:  She is sleeping this morning. She appears comfortable.  Objective: Vital signs in last 24 hours: Blood pressure 96/67, pulse 62, temperature 98.6 F (37 C), temperature source Oral, resp. rate 18, height 5\' 4"  (1.626 m), weight 158 lb (71.668 kg), SpO2 97.00%.  Intake/Output from previous day:    Physical Exam:  Abdomen: Soft , nontender, firm 1-2 cm nodular area at the mid abdomen above the umbilicus   Lab Results:  [facility] 04/13/11 0655 04/12/11 0548  WBC 8.1 5.5  HGB 8.5* 8.3*  HCT 24.8* 24.2*  PLT 294 276    BMET  Basename 04/13/11 0655 04/12/11 0548  NA 141 140  K 2.9* 3.1*  CL 109 110  CO2 15* 16*  GLUCOSE 99 71  BUN 21 19  CREATININE 0.91 0.78  CALCIUM 8.9 8.3*    Studies/Results: Ct Abdomen Pelvis Wo Contrast  04/11/2011  *RADIOLOGY REPORT*  Clinical Data: Adenocarcinoma of the pancreas for which the patient is undergoing chemotherapy.  Diarrhea.  Nausea and vomiting. Anorexia.  Lower abdominal pain.  CT ABDOMEN AND PELVIS WITHOUT CONTRAST 04/11/2011:  Technique:  Multidetector CT imaging of the abdomen and pelvis was performed following the standard protocol without intravenous contrast.  Comparison: CT abdomen pelvis with contrast 03/22/2011.  Findings: Diffuse body wall edema.  Interval increase in the amount of ascites within the abdomen and pelvis, now moderately large. Stable moderately distended gallbladder.  No intra or extrahepatic biliary ductal dilation.  Indwelling common bile duct stent which it is likely patent, as there is gas within the intrahepatic bile ducts.  Mass involving the head of the pancreas, unchanged, with atrophy involving the body and tail.  Adenopathy within the celiac axis, unchanged.  Low attenuation involving the right and left lobes centrally, unchanged.  Within the limits of the unenhanced technique, no new hepatic parenchymal abnormalities.  Normal appearing spleen. Normal unenhanced appearance of  the kidneys without evidence of hydronephrosis.  Moderate aorto-iliofemoral atherosclerosis without aneurysm.  Nasogastric tube within the stomach which is decompressed and unremarkable.  Normal-appearing small bowel.  Relatively decompressed colon throughout, with persistent wall thickening involving the cecum, ascending colon, transverse colon, and descending colon.  Urinary bladder unremarkable.  Bone window images again demonstrate a mottled appearance to the thoracic and lumbar spine, degenerative changes involving the lower thoracic and lower lumbar spine, and mild degenerative changes in the hips.  Small left pleural effusion with associated passive atelectasis in the left lower lobe.  Vague nodular opacity in the right middle lobe, visualized on the initial image, unchanged.  IMPRESSION:  1.  Interval increase in the amount of ascites within the abdomen and pelvis since the prior CT 03/22/2011. 2.  Otherwise, no significant interval change. 3.  Residual or recurrent colitis. 4.  Indwelling common bile duct stent which appears patent, as there is gas within the intrahepatic bile ducts. 5.  Stable mass involving the head of the pancreas and metastatic the celiac axis lymphadenopathy. 6.  Small left pleural effusion and associated passive atelectasis in the left lower lobe.  Stable vague oval shaped nodular opacity in the right middle lobe. 7.  Anasarca.  Original Report Authenticated By: Arnell Sieving, M.D.    Medications: I have reviewed the patient's current medications.  Assessment/Plan:    1. History of locally advanced pancreas cancer, currently maintained off of specific therapy.  2. Admission with nausea/vomiting and abdominal pain-potentially related to the pancreas cancer, a viral infection, the urinary tract infection, or  another GI process. CT of the abdomen on 04/03/2011 did not suggest a bowel obstruction. No recurrent emesis since the NG tube was discontinued yesterday  3.  Altered mental status secondary to acute delirium and underlying dementia  4. History of multiple myeloma-review of the January 2013 CT scans is suggestive of myelomatous lesions in the bone marrow and bones  5. Anemia secondary to chronic disease and recent bleeding  6. History of rheumatoid arthritis  7. Abdominal pain-likely related to pancreas cancer  8. Urinary tract infection  9. Increased ascites on the CT 04/03/2011-potentially related to anasarca versus carcinomatosis 10.? Abdominal mass on exam today Recommendations: -Continue supportive care measures  -She has multiple comorbid conditions and a very poor performance status. She appears to be a candidate for hospice care. I discussed the situation with her daughter yesterday and again today. I recommend placement and hospice care. -The abdominal wall "mass" may represent a hernia, an area of scarring, tumor, or stool.  Her daughter requests a consultation by Dr. Elnoria Howard.   LOS: 4 days   Yehoshua Vitelli BRADLEY  04/13/2011, 2:02 PM

## 2011-04-13 NOTE — Progress Notes (Signed)
Physical Therapy Treatment Patient Details Name: Heather Dorsey MRN: [id number] DOB: Apr 11, 1923 Today's Date: 04/13/2011  PT Assessment/Plan  PT - Assessment/Plan Comments on Treatment Session: Patient minimally interactive and needs repeated cues and manual assist to participate.  Daughter in room and concerned re: fluid retention and pt not on fluid pills.  Will need extensive assist at home if d/c's home with daughter assist.  May need goals updated if continues to decline at next session. PT Plan: Discharge plan needs to be updated PT Frequency: Min 3X/week Follow Up Recommendations: Home health PT;Skilled nursing facility (depending on daughters ability to care for patient at home) Equipment Recommended: Defer to next venue PT Goals  Acute Rehab PT Goals Pt will go Supine/Side to Sit: with supervision PT Goal: Supine/Side to Sit - Progress: Not progressing Pt will go Sit to Supine/Side: with supervision PT Goal: Sit to Supine/Side - Progress: Not progressing Pt will go Sit to Stand: with supervision PT Goal: Sit to Stand - Progress: Not progressing Pt will go Stand to Sit: with supervision PT Goal: Stand to Sit - Progress: Not progressing Pt will Ambulate: 51 - 150 feet;with rolling walker;with supervision PT Goal: Ambulate - Progress: Not progressing  PT Treatment Precautions/Restrictions  Precautions Precautions: Fall Restrictions Weight Bearing Restrictions: No Mobility (including Balance) Bed Mobility Rolling Right: 2: Max assist;With rail Rolling Right Details (indicate cue type and reason): cues and assist for bending knee and reaching for rail and turning on side Right Sidelying to Sit: With rails;1: +2 Total assist Right Sidelying to Sit Details (indicate cue type and reason):  assist to get feet off bed and lift trunk pt=20% Sitting - Scoot to Edge of Bed: 3: Mod assist Sitting - Scoot to Edge of Bed Details (indicate cue type and reason): pulling on pad under  patient Sit to Supine: 1: +2 Total assist Sit to Supine - Details (indicate cue type and reason): pt=20% Transfers Sit to Stand: 2: Max assist;From bed;With upper extremity assist;1: +2 Total assist Sit to Stand Details (indicate cue type and reason): 1x with max asssist, 2 x with +2 assist pt=30% Stand to Sit: 1: +2 Total assist;To bed Stand to Sit Details: pt=20% assist to control descent Ambulation/Gait Ambulation/Gait Assistance: 1: +2 Total assist Ambulation/Gait Assistance Details (indicate cue type and reason): pt=30-40% Ambulation Distance (Feet): 2 Feet (side stepping to [facility]) Assistive device: Rolling walker  Posture/Postural Control Posture/Postural Control: Postural limitations Postural Limitations: keeps hips flexed, weight shifted in stance to right Exercise    End of Session PT - End of Session Equipment Utilized During Treatment: Gait belt Activity Tolerance: Patient limited by fatigue Patient left: with family/visitor present;in bed;with call bell in reach General Behavior During Session: Flat affect Cognition: Impaired  Heather Dorsey,CYNDI 04/13/2011, 4:30 PM

## 2011-04-14 ENCOUNTER — Inpatient Hospital Stay (HOSPITAL_COMMUNITY): Payer: Medicare Other

## 2011-04-14 LAB — BASIC METABOLIC PANEL
CO2: 18 mEq/L — ABNORMAL LOW (ref 19–32)
Chloride: 110 mEq/L (ref 96–112)
Creatinine, Ser: 1.08 mg/dL (ref 0.50–1.10)
GFR calc Af Amer: 53 mL/min — ABNORMAL LOW (ref >90)
Potassium: 4.2 mEq/L (ref 3.5–5.1)
Sodium: 141 mEq/L (ref 135–145)

## 2011-04-14 LAB — BODY FLUID CELL COUNT WITH DIFFERENTIAL
Lymphs, Fluid: 8 %
Monocyte-Macrophage-Serous Fluid: 1 % — ABNORMAL LOW (ref 50–90)

## 2011-04-14 LAB — MAGNESIUM: Magnesium: 2.9 mg/dL — ABNORMAL HIGH (ref 1.5–2.5)

## 2011-04-14 MED ORDER — IPRATROPIUM BROMIDE 0.02 % IN SOLN
0.5000 mg | RESPIRATORY_TRACT | Status: DC
  Administered 2011-04-14 – 2011-04-15 (×4): 0.5 mg via RESPIRATORY_TRACT
  Filled 2011-04-14 (×5): qty 2.5

## 2011-04-14 MED ORDER — FUROSEMIDE 10 MG/ML IJ SOLN
40.0000 mg | Freq: Once | INTRAMUSCULAR | Status: AC
  Administered 2011-04-14: 40 mg via INTRAVENOUS
  Filled 2011-04-14: qty 4

## 2011-04-14 MED ORDER — LEVOFLOXACIN 750 MG PO TABS: 750.0000 mg | ORAL_TABLET | ORAL | Status: DC

## 2011-04-14 MED ORDER — IOHEXOL 300 MG/ML  SOLN
80.0000 mL | Freq: Once | INTRAMUSCULAR | Status: AC | PRN
  Administered 2011-04-14: 80 mL via INTRAVENOUS

## 2011-04-14 MED ORDER — PIPERACILLIN-TAZOBACTAM 3.375 G IVPB
3.3750 g | Freq: Three times a day (TID) | INTRAVENOUS | Status: DC
  Administered 2011-04-14 – 2011-04-15 (×3): 3.375 g via INTRAVENOUS
  Filled 2011-04-14 (×4): qty 50

## 2011-04-14 MED ORDER — ALBUTEROL SULFATE (5 MG/ML) 0.5% IN NEBU
2.5000 mg | INHALATION_SOLUTION | RESPIRATORY_TRACT | Status: DC | PRN
  Administered 2011-04-15: 2.5 mg via RESPIRATORY_TRACT
  Filled 2011-04-14 (×2): qty 0.5

## 2011-04-14 MED ORDER — VANCOMYCIN HCL IN DEXTROSE 1-5 GM/200ML-% IV SOLN
1000.0000 mg | INTRAVENOUS | Status: DC
  Administered 2011-04-14: 1000 mg via INTRAVENOUS
  Filled 2011-04-14 (×2): qty 200

## 2011-04-14 NOTE — Consult Note (Signed)
Reason for Consult: Nausea/Vomiting, pancreatic cancer Referring Physician: [facility]  Phyllis Ginger HPI: This is an 78 year old female with multiple medical problems who was admitted earlier this week with nausea and vomiting of bile.  She has a history of pancreatic ca and she is s/p biliary stent placement in 01/2010.  She subsequently was treated at [facility] and she had a remarkable response to the chemotherapy as her cancer regressed.  Unfortunately her cancer has not recurred and there is ascites as well as lesions in her lungs and bone suspicious for metastases.  Her daughter requested a GI consultation as she was frustrated with the current care and needed further clarification of her mother's status.  Past Medical History  Diagnosis Date  . Allergic rhinitis   . Bronchitis, mucopurulent recurrent   . Pulmonary fibrosis   . Lumbar back pain   . Osteopenia   . Arteriosclerotic dementia   . Paresthesia   . Multiple myeloma   . Anxiety   . PONV (postoperative nausea and vomiting)   . DJD (degenerative joint disease)   . Seronegative rheumatoid arthritis     knees  . Adenocarcinoma of pancreas     in remission    Past Surgical History  Procedure Date  . Appendectomy   . Abdominal hysterectomy   . Common dile duct stent 02/2010    for pancreatic cancer  . Colonoscopy 03/24/2011    Procedure: COLONOSCOPY;  Surgeon: Theda Belfast, MD;  Location: [facility];  Service: Endoscopy;  Laterality: N/A;    History reviewed. No pertinent family history.  Social History:  reports that she quit smoking about 66 years ago. Her smoking use included Cigarettes. She quit after 1 year of use. She has never used smokeless tobacco. She reports that she does not drink alcohol or use illicit drugs.  Allergies:  Allergies  Allergen Reactions  . Bee Pollen Swelling    Medications:  Scheduled:   . donepezil  5 mg Oral Daily  . ipratropium  0.5 mg Nebulization Q4H  .  levofloxacin  750 mg Oral Q48H  . pantoprazole sodium  40 mg Oral Q1200  . piperacillin-tazobactam (ZOSYN)  IV  3.375 g Intravenous Q8H  . potassium chloride  10 mEq Intravenous Q1 Hr x 4  . predniSONE  5 mg Oral Q breakfast  . vancomycin  1,000 mg Intravenous Q24H  . DISCONTD: levofloxacin  750 mg Oral Daily   Continuous:   Results for orders placed during the hospital encounter of 04/09/11 (from the past 24 hour(s))  MAGNESIUM     Status: Normal   Collection Time   04/13/11  5:00 PM      Component Value Range   Magnesium 2.4  1.5 - 2.5 (mg/dL)  BASIC METABOLIC PANEL     Status: Abnormal   Collection Time   04/14/11  5:00 AM      Component Value Range   Sodium 141  135 - 145 (mEq/L)   Potassium 4.2  3.5 - 5.1 (mEq/L)   Chloride 110  96 - 112 (mEq/L)   CO2 18 (*) 19 - 32 (mEq/L)   Glucose, Bld 125 (*) 70 - 99 (mg/dL)   BUN 24 (*) 6 - 23 (mg/dL)   Creatinine, Ser 1.61  0.50 - 1.10 (mg/dL)   Calcium 9.1  8.4 - 09.6 (mg/dL)   GFR calc non Af Amer 45 (*) >90 (mL/min)   GFR calc Af Amer 53 (*) >90 (mL/min)  No results found.  ROS:  As stated above in the HPI otherwise negative.  Blood pressure 124/77, pulse 97, temperature 98.4 F (36.9 C), temperature source Oral, resp. rate 18, height 5\' 4"  (1.626 m), weight 71.668 kg (158 lb), SpO2 98.00%.    PE: Gen: NAD, Alert and Oriented HEENT:  [place]/AT, EOMI Neck: Supple, no LAD Lungs: CTA Bilaterally CV: RRR without M/G/R ABM: Soft, NTND, +BS Ext: No C/C/E  Assessment/Plan: 1) Pancreatic cancer 2) Vomiting 3) UTI 4) Multiple medical problems   After reviewing the patient's chart I had a long discussion with the daughter and I tried to address and answer her questions.  I do not know for certain why she has the vomiting of bile.  I cannot be certain that she had a viral gastroenteritis.  There is no evidence of any obstruction.  I do agree with my colleagues that she should undergo care from Hospice, but her daughter is not  certain if everything has been performed to help her mother.  From a GI standpoint I do not know of any other interventions that will improve her situation.  I think the use of antiemetics and protonix is acceptable.  I discussed the case with Dr. Phillips Odor and I agree with attempting a paracentesis.  She does have ascites, but it is localized near the hepatic and splenic region.  If it is positive for malignancy then I think it will help the daughter solidify in her mind about Hospice care.  Plan: 1) Paracentesis. 2) Continue with supportive care.  Alveria Mcglaughlin D 04/14/2011, 2:12 PM

## 2011-04-14 NOTE — Progress Notes (Signed)
ANTIBIOTIC CONSULT NOTE - INITIAL  Pharmacy Consult for Vancomycin/Zosyn/Levaquin Indication: ?HAP  Allergies  Allergen Reactions  . Bee Pollen Swelling    Patient Measurements: Height: 5\' 4"  (162.6 cm) Weight: 158 lb (71.668 kg) IBW/kg (Calculated) : 54.7   Vital Signs: Temp: 98.4 F (36.9 C) (01/31 0514) Temp src: Oral (01/31 0514) BP: 124/77 mmHg (01/31 0514) Pulse Rate: 97  (01/31 0514) Intake/Output from previous day: 01/30 0701 - 01/31 0700 In: 5540 [P.O.:540; I.V.:5000] Out: -  Intake/Output from this shift: Total I/O In: 220 [I.V.:220] Out: -   Labs:  Basename 04/14/11 0500 04/13/11 0655 04/12/11 0548  WBC -- 8.1 5.5  HGB -- 8.5* 8.3*  PLT -- 294 276  LABCREA -- -- --  CREATININE 1.08 0.91 0.78   Estimated Creatinine Clearance: 37 ml/min (by C-G formula based on Cr of 1.08). No results found for this basename: VANCOTROUGH:2,VANCOPEAK:2,VANCORANDOM:2,GENTTROUGH:2,GENTPEAK:2,GENTRANDOM:2,TOBRATROUGH:2,TOBRAPEAK:2,TOBRARND:2,AMIKACINPEAK:2,AMIKACINTROU:2,AMIKACIN:2, in the last 72 hours   Microbiology: Recent Results (from the past 720 hour(s))  MRSA PCR SCREENING     Status: Normal   Collection Time   03/22/11  9:56 PM      Component Value Range Status Comment   MRSA by PCR NEGATIVE  NEGATIVE  Final   CLOSTRIDIUM DIFFICILE BY PCR     Status: Normal   Collection Time   03/23/11  8:47 AM      Component Value Range Status Comment   C difficile by pcr NEGATIVE  NEGATIVE  Final   URINE CULTURE     Status: Normal   Collection Time   04/09/11 12:37 PM      Component Value Range Status Comment   Specimen Description URINE, CATHETERIZED   Final    Special Requests NONE   Final    Culture  Setup Time [id number]   Final    Colony Count >=100,000 COLONIES/ML   Final    Culture     Final    Value: ESCHERICHIA COLI     KLEBSIELLA PNEUMONIAE   Report Status 04/12/2011 FINAL   Final    Organism ID, Bacteria ESCHERICHIA COLI   Final    Organism ID, Bacteria  KLEBSIELLA PNEUMONIAE   Final     Medical History: Past Medical History  Diagnosis Date  . Allergic rhinitis   . Bronchitis, mucopurulent recurrent   . Pulmonary fibrosis   . Lumbar back pain   . Osteopenia   . Arteriosclerotic dementia   . Paresthesia   . Multiple myeloma   . Anxiety   . PONV (postoperative nausea and vomiting)   . DJD (degenerative joint disease)   . Seronegative rheumatoid arthritis     knees  . Adenocarcinoma of pancreas     in remission    Medications:  Scheduled:    . donepezil  5 mg Oral Daily  . ipratropium  0.5 mg Nebulization Q4H  . levofloxacin  750 mg Oral Daily  . pantoprazole sodium  40 mg Oral Q1200  . potassium chloride  10 mEq Intravenous Q1 Hr x 4  . predniSONE  5 mg Oral Q breakfast   Assessment: Patient is now Day 3 Levaquin for Ecoli/klebsiella UTI. To add Vanomycin and Zosyn for potential HAP per Md request. Note that patient received 4 doses of Vancomycin 750mg  IV q12 from 1/27 to 1/29 and also was getting Zosyn 3.375g IV q8 (extended interval infusion) from 1/27 to 1/29 as well  Goal of Therapy:  Vancomycin trough level 15-20 mcg/ml and zosyn adjustment per changing renal function  Plan:  1. Zosyn 3.375g IV q8 (extended interval infusion) for CrCl > 20 ml/min 2. Vancomycin 1g IV q24 for CrCl 30-40 ml/min. Will check a trough at steady state 3. Also for CrCl < 50 ml/min, will change Levaquin from 750mg  PO q24 to q49 and monitor for any additional changes  Berkley Harvey, PharmD, BCPS 04/14/2011,11:40 AM

## 2011-04-14 NOTE — Procedures (Signed)
US guided diagnostic and therapeutic paracentesis performed yielding 1.5 liters slightly turbid, yellow fluid. A portion of the fluid was sent to the lab for preordered studies. No immediate complications.

## 2011-04-14 NOTE — Progress Notes (Signed)
IP PROGRESS NOTE  Subjective:   She is lethargic.  Objective: Vital signs in last 24 hours: Blood pressure 124/77, pulse 97, temperature 98.4 F (36.9 C), temperature source Oral, resp. rate 18, height 5\' 4"  (1.626 m), weight 158 lb (71.668 kg), SpO2 98.00%.  Intake/Output from previous day: 01/30 0701 - 01/31 0700 In: 5540 [P.O.:540; I.V.:5000] Out: -   Physical Exam:  Lungs: Increased respiratory rate, diffuse inspiratory/expiratory rhonchi and wheezes Cardiac: Distant heart sounds, regular rhythm Abdomen: Soft, 1-2 cm midline upper abdominal nodular fullness  Portacath/PICC-without erythema  Lab Results:  Basename 04/13/11 0655 04/12/11 0548  WBC 8.1 5.5  HGB 8.5* 8.3*  HCT 24.8* 24.2*  PLT 294 276    BMET  Basename 04/14/11 0500 04/13/11 0655  NA 141 141  K 4.2 2.9*  CL 110 109  CO2 18* 15*  GLUCOSE 125* 99  BUN 24* 21  CREATININE 1.08 0.91  CALCIUM 9.1 8.9    Studies/Results: No results found.  Medications: I have reviewed the patient's current medications.  Assessment/Plan:  1. Locally advanced pancreas cancer, currently maintained off of specific therapy.  2. Admission with nausea/vomiting and abdominal pain-potentially related to pancreas cancer, a viral infection, a urinary tract infection, or another GI process. A CT abdomen nontender 22,013 did not suggest a bowel obstruction. She has been without recurrent emesis since the NG tube was discontinued.  3. Altered mental status secondary to underlying dementia and hospital delirium  4. History of multiple myeloma  5. Anemia secondary to chronic disease, recent bleeding, and multiple myeloma  6. History of rheumatoid arthritis  7. Abdominal pain-most likely related to pancreas cancer  8. Urinary tract infection  9. Increased ascites on the CT 04/03/2011, potentially related to anasarca versus carcinomatosis  10. Midline abdomen nodular lesion-? Tumor  11. New wheezing/rhonchi this  morning-? Aspiration.   Her daughter is not present this morning. I discussed the case with Dr. Kriste Basque yesterday. He agrees the best plan is to recommend a comfort care approach with hospice. He reported the patient's daughter has requested a transfer to [facility]. I am concerned she is currently not stable for transfer.     LOS: 5 days   Heather Dorsey  04/14/2011, 8:55 AM

## 2011-04-14 NOTE — Progress Notes (Signed)
Pt. only voided once last night a very small amount.  Pt. Was bladder scanned this am and it showed 500cc's residual urine.  MD was notified and order was received.  Foley was inserted.

## 2011-04-14 NOTE — Consults (Signed)
Consult Note from the Palliative Medicine Team at [facility]  Consult Requested by: Dr. Manson Passey, MD    PCP: Michele Mcalpine, MD, MD Reason for Consultation:Goals of Care, Symptom Management, Care Coordination Phone Number:[phone]  Assessment and Plan  Summary of Goals 1. Daughter still seeking, clear concise medical explanation for her mother's decline. Deep mistrust in medical community. She has been researching on the internet. I did encourage advocacy and self education but also advised caution. She clearly wants to protect her mother and do everything possible to improve her mother's situation. She is also willing to accept bad news and move forward with Hospice Care if there is significant tumor mets.  Major Barriers: -Daughter cannot reconcile fact that CT scan report says tumor size has not changed and providers keep blaming pancreatic cancer on her decline. I discussed a more holistic approach to her mother and redirected focus away from tumor size-I discussed possibility of malignant spread -new ascites, pulmonary mets, the blood loss from anticoagulation from port-cath tip clot, generalized weakness, deconditioning at 85, delirium from UTI, probable aspiration pneumonia, risk and that people rarely die of tumor alone.  PLAN- Will get diagnostic paracentesis-if malignant effusion-we can more forward with comfort care. Will also get CT Lungs for staging.   -Daughter would like explanation for acute decline 2 days ago with significant AMS-she questions stroke or brain bleed  PLAN- will check CT head, she did have high INR may have subacute SDB.   2. Code Status: Now DNR, we discussed that this does not mean do not treat, nor does it mean we limit diagnostic testing or monitoring. I also discussed appropriateness for Palliative Care unit-daughter agreeable-will consider transfer tomorrow or this evening.  3. Symptom Control:  Abdominal pain, dyspnea, vomiting-prn med orders  placed.  PMH:  Past Medical History  Diagnosis Date  . Allergic rhinitis   . Bronchitis, mucopurulent recurrent   . Pulmonary fibrosis   . Lumbar back pain   . Osteopenia   . Arteriosclerotic dementia   . Paresthesia   . Multiple myeloma   . Anxiety   . PONV (postoperative nausea and vomiting)   . DJD (degenerative joint disease)   . Seronegative rheumatoid arthritis     knees  . Adenocarcinoma of pancreas     in remission     PSH: Past Surgical History  Procedure Date  . Appendectomy   . Abdominal hysterectomy   . Common dile duct stent 02/2010    for pancreatic cancer  . Colonoscopy 03/24/2011    Procedure: COLONOSCOPY;  Surgeon: Theda Belfast, MD;  Location: [facility] ENDOSCOPY;  Service: Endoscopy;  Laterality: N/A;   I have reviewed the FH and SH and  If appropriate update it with new information. Allergies  Allergen Reactions  . Bee Pollen Swelling   Scheduled Meds:   . donepezil  5 mg Oral Daily  . ipratropium  0.5 mg Nebulization Q4H  . levofloxacin  750 mg Oral Q48H  . pantoprazole sodium  40 mg Oral Q1200  . piperacillin-tazobactam (ZOSYN)  IV  3.375 g Intravenous Q8H  . potassium chloride  10 mEq Intravenous Q1 Hr x 4  . predniSONE  5 mg Oral Q breakfast  . vancomycin  1,000 mg Intravenous Q24H  . DISCONTD: levofloxacin  750 mg Oral Daily   Continuous Infusions:  PRN Meds:.albuterol, alum & mag hydroxide-simeth, HYDROcodone-acetaminophen, ondansetron (ZOFRAN) IV, ondansetron, phenol, promethazine, simethicone    BP 124/77  Pulse 97  Temp(Src) 98.4 F (36.9  C) (Oral)  Resp 18  Ht 5\' 4"  (1.626 m)  Wt 71.668 kg (158 lb)  BMI 27.12 kg/m2  SpO2 98%   PPS:30    Intake/Output Summary (Last 24 hours) at 04/14/11 1321 Last data filed at 04/14/11 0800  Gross per 24 hour  Intake   5760 ml  Output      0 ml  Net   5760 ml    Physical Exam:   Labs: CBC    Component Value Date/Time   WBC 8.1 04/13/2011 0655   WBC 6.5 02/16/2011 1119   RBC 2.84*  04/13/2011 0655   RBC 3.47* 02/16/2011 1119   HGB 8.5* 04/13/2011 0655   HGB 10.9* 02/16/2011 1119   HCT 24.8* 04/13/2011 0655   HCT 32.7* 02/16/2011 1119   PLT 294 04/13/2011 0655   PLT 149 02/16/2011 1119   MCV 87.3 04/13/2011 0655   MCV 94.1 02/16/2011 1119   MCH 29.9 04/13/2011 0655   MCH 31.4 02/16/2011 1119   MCHC 34.3 04/13/2011 0655   MCHC 33.4 02/16/2011 1119   RDW 17.1* 04/13/2011 0655   RDW 15.2* 02/16/2011 1119   LYMPHSABS 0.3* 04/12/2011 0548   LYMPHSABS 0.3* 02/16/2011 1119   MONOABS 0.3 04/12/2011 0548   MONOABS 0.5 02/16/2011 1119   EOSABS 0.1 04/12/2011 0548   EOSABS 0.1 02/16/2011 1119   BASOSABS 0.0 04/12/2011 0548   BASOSABS 0.0 02/16/2011 1119    BMET    Component Value Date/Time   NA 141 04/14/2011 0500   K 4.2 04/14/2011 0500   CL 110 04/14/2011 0500   CO2 18* 04/14/2011 0500   GLUCOSE 125* 04/14/2011 0500   BUN 24* 04/14/2011 0500   CREATININE 1.08 04/14/2011 0500   CALCIUM 9.1 04/14/2011 0500   GFRNONAA 45* 04/14/2011 0500   GFRAA 53* 04/14/2011 0500    CMP     Component Value Date/Time   NA 141 04/14/2011 0500   K 4.2 04/14/2011 0500   CL 110 04/14/2011 0500   CO2 18* 04/14/2011 0500   GLUCOSE 125* 04/14/2011 0500   BUN 24* 04/14/2011 0500   CREATININE 1.08 04/14/2011 0500   CALCIUM 9.1 04/14/2011 0500   PROT 6.4 04/10/2011 0300   ALBUMIN 2.4* 04/10/2011 0300   AST 16 04/10/2011 0300   ALT 19 04/10/2011 0300   ALKPHOS 227* 04/10/2011 0300   BILITOT 0.8 04/10/2011 0300   GFRNONAA 45* 04/14/2011 0500   GFRAA 53* 04/14/2011 0500     Greater than 50%  of this time was spent counseling and coordinating care related to the above assessment and plan.

## 2011-04-14 NOTE — Progress Notes (Addendum)
Patient ID: Heather Dorsey, female   DOB: 1924/02/18, 78 y.o.   MRN: [id number]  Brief course of events 78 year old female with history of pancreatic cancer s/p biliary stent placement in 01/2010. who was admitted initially with nausea and vomiting of bile. She has been subsequently treated at [facility] with a remarkable response to the chemotherapy as her cancer regressed. Unfortunately her cancer has recurred complicated with ascites and lesions in her lungs and bones suspicious for metastases. To patient's daughter request GI consultation obtained for further clarification of her mother current condition. Additionally, palliative care team has met with the patient's daughter to discuss the current care, goals of care and more holistic approach. As per daughter she is willing to consider hospice care should the findings of CT chest and paracentesis yield results that point towards the worsening malignant spread secondary to pancreatic neoplasm.  CONSULTS TO DATE: 1. Gastroenterology 2. Oncology 3. Palliative care team   Assessment and plan:  Principal Problem:  *ABDOMINAL PAIN, NAUSEA AND VOMITING - likely secondary to locally aggressive pancreatic cancer versus an infectious process - CT scan abdomen is not suggestive of small bowel obstruction - patient did require NG tube but since she has not had recent episode of vomiting NG tube was discontinued - we will continue to monitor her clinical course  Active Problems:  ALTERED MENTAL STATUS - perhaps secondary to progressively worsening dementia versus delirium - CT head from 04/14/11 shows no acute intracranial abnormalities  ACUTE RESPIRATORY DISTRESS - likely secondary to possible aspiration pneumonia versus mild pulmonary edema - Levaquin will be continued - added vancomycin and zosyn and nebulizer treatments - CT chest shows findings of mild pulmonary edema which could easily be secondary to malnutrition (due to low oncotic  pressures) versus an infectious process - patient is in 5 L positive fluid balance with minimal fluid output - we will give  lasix 40 mg IV x 1 dose and monitor diuresis and see if patient improves clinically; please evaluate in am if she can continue to get lasix - daily intake and output  UTI (URINARY TRACT INFECTION) - secondary to E. Coli and Klebsiella Pneumoniae - continue current antibiotic regimen with Levaquin and vanco and zosyn  ASCITES - likely secondary to decreased oncotic pressure secondary to malnutrition versus retroperitoneal carcinomatosis/metastatic spread - patient had paracentesis today diagnostic and therapeutic and has reported some relief status post procedure - please follow up result of paracentesis  ANEMIA SECONDARY TO CHRONIC DISEASE - hemoglobin is stable around 8 - 9 - no transfusion warranted at this time  EDUCATION - test results and diagnostic studies were discussed with patient and pt's family who was present at the bedside - patient and family have verbalized the understanding - questions were answered at the bedside and contact information was provided for additional questions or concerns - Speech and swallow evaluation  Subjective:  Patient was seen and examined at bedside. No significant events overnight. Was called by RN that patient had crackles on lung physical exam.  Objective:  Vital signs in last 24 hours:  Filed Vitals:   04/14/11 1525 04/14/11 1534 04/14/11 2029 04/14/11 2123  BP: 101/55 95/56  99/67  Pulse:    96  Temp:    98.2 F (36.8 C)  TempSrc:    Oral  Resp:    16  Height:      Weight:      SpO2:   98% 97%    Intake/Output from previous day:  Intake/Output Summary (Last 24 hours) at 04/14/11 2137 Last data filed at 04/14/11 0800  Gross per 24 hour  Intake   5400 ml  Output      0 ml  Net   5400 ml    Physical Exam: General: in no acute distress, oriented to self only HEENT: dry oral mucosa Heart: Regular  rate and rhythm, S1/S2 +, no murmurs, rubs, gallops. Lungs: upper lung lobes some rhonchi appreciated without wheezing Abdomen: Soft, nontender, nondistended, positive bowel sounds. Extremities: No clubbing or cyanosis, (+1) pitting edema,  positive pedal pulses. Neuro: Grossly nonfocal.  Lab Results:  Lab 04/13/11 0655 04/12/11 0548 04/10/11 2008 04/10/11 0500 04/09/11 1220  WBC 8.1 5.5 13.6* 12.5* 11.0*  HGB 8.5* 8.3* 8.7* 9.3* 10.2*  HCT 24.8* 24.2* 24.9* 26.8* 29.2*  PLT 294 276 255 297 282  MCV 87.3 86.4 85.6 85.6 87.4  MCH 29.9 29.6 29.9 29.7 30.5  MCHC 34.3 34.3 34.9 34.7 34.9  RDW 17.1* 16.7* 16.2* 16.5* 16.5*  LYMPHSABS -- 0.3* -- -- 0.1*  MONOABS -- 0.3 -- -- 0.6  EOSABS -- 0.1 -- -- 0.0  BASOSABS -- 0.0 -- -- 0.0  BANDABS -- -- -- -- --    Lab 04/14/11 0500 04/13/11 1700 04/13/11 0655 04/12/11 0548 04/10/11 0300 04/09/11 1220  NA 141 -- 141 140 133* 131*  K 4.2 -- 2.9* 3.1* 3.7 4.3  CL 110 -- 109 110 101 97  CO2 18* -- 15* 16* 18* 16*  GLUCOSE 125* -- 99 71 138* 175*  BUN 24* -- 21 19 20 20   CREATININE 1.08 -- 0.91 0.78 0.55 0.70  CALCIUM 9.1 -- 8.9 8.3* 8.9 9.6  MG -- 2.4 -- -- -- --   No results found for this basename: INR:5,PROTIME:5 in the last 168 hours Cardiac markers:  Lab 04/09/11 1220  CKMB 2.1  TROPONINI <0.30  MYOGLOBIN --   No components found with this basename: POCBNP:3 Recent Results (from the past 240 hour(s))  URINE CULTURE     Status: Normal   Collection Time   04/09/11 12:37 PM      Component Value Range Status Comment   Specimen Description URINE, CATHETERIZED   Final    Special Requests NONE   Final    Culture  Setup Time [id number]   Final    Colony Count >=100,000 COLONIES/ML   Final    Culture     Final    Value: ESCHERICHIA COLI     KLEBSIELLA PNEUMONIAE   Report Status 04/12/2011 FINAL   Final    Organism ID, Bacteria ESCHERICHIA COLI   Final    Organism ID, Bacteria KLEBSIELLA PNEUMONIAE   Final      Studies/Results: Ct Head W Wo Contrast  04/14/2011   IMPRESSION: Atrophy and chronic ischemic white matter disease without acute intracranial abnormality or evidence of intracranial metastatic disease.  Please note that MRI is more sensitive and CT for evaluation of metastatic disease to the brain.     04/14/2011 IMPRESSION:  1.  This examination was performed as a chest CT with contrast. While there is no large central pulmonary embolism, the examination is insufficient to exclude smaller segmental and subsegmental sized pulmonary emboli. 2.  Appearance of the lung parenchyma suggests mild pulmonary edema.  No definitive findings at this time to suggest pneumonia or bronchitis. 3.  Small left and trace right-sided pleural effusions are unchanged. 4.  Multiple pulmonary nodules, as above, unchanged compared to recent prior study. 5. Atherosclerosis, including  left main and two-vessel coronary artery disease. Please note that although the presence of coronary artery calcium documents the presence of coronary artery disease, the severity of this disease and any potential stenosis cannot be assessed on this non-gated CT examination. 6.  Esophagus is patulous and fluid-filled, suggesting that the patient is a increased risk for aspiration.  Appearance of the lung bases suggests extensive scarring, likely from prior aspiration events.    Medications: Scheduled Meds:   . donepezil  5 mg Oral Daily  . ipratropium  0.5 mg Nebulization Q4H  . levofloxacin  750 mg Oral Q48H  . pantoprazole sodium  40 mg Oral Q1200  . piperacillin-tazobactam (ZOSYN)  IV  3.375 g Intravenous Q8H  . predniSONE  5 mg Oral Q breakfast  . vancomycin  1,000 mg Intravenous Q24H  . DISCONTD: levofloxacin  750 mg Oral Daily   Continuous Infusions:  PRN Meds:.albuterol, alum & mag hydroxide-simeth, HYDROcodone-acetaminophen, iohexol, ondansetron (ZOFRAN) IV, ondansetron, phenol, promethazine, simethicone    LOS: 5 days    Heather Dorsey 04/14/2011, 9:37 PM  [facility] Pager: [phone]

## 2011-04-14 NOTE — Progress Notes (Signed)
Follow up visit with pt and daughter [name]).  Pt was resting comfortably.   Daughter expressed relief following palliative consult today - reported feeling as though they had made strides.    Daughter remains vigilant and expresses her care for her mother through seeking definitive information.   As palliative noted, pt's daughter demonstrates mistrust of medical professionals.  Feels as though physicians should be able to pin point what is wrong and give conclusive information.  She reports trusting those who are able to "speak conclusively" rather than "speculate."   Daughter was appreciative of DNR conversation and expressed not wanting her mother to be in distress at end of life.   Chaplain provided emotional and spiritual support and engaged in preliminary anticipatory grief work.   Chaplain prayed with pt and daughter -- Daughter prayed  for "miraculous healing" for her mother.      04/14/11 1800  Clinical Encounter Type  Visited With Patient and family together  Visit Type Follow-up;Spiritual support;Psychological support;Social support  Referral From Nurse  Consult/Referral To Chaplain  Stress Factors  Family Stress Factors Health changes;Exhausted;Loss of control;Other (Comment) (Feels out of control due to a perceived lack of information)

## 2011-04-14 NOTE — Progress Notes (Signed)
Palliative Care consult received. I have reviewed the records in detail and our team will be happy to assist. Will confirm with oncology and primary team that indeed the family is agreeable to palliative consult prior to [place] engaging given the complex nature of previous interactions. Patient appears to have adequate symptom control documented by clinical staff.

## 2011-04-14 NOTE — Progress Notes (Addendum)
Patient noted to have low urine output, crackles and expiratory wheeze on assessment.  MD on Dr. Elisabeth Pigeon notified, since this is new since previous assessment with new orders to increase IV fluids.  Patient is exhibiting no signs of respiratory distress and oxygen saturation on room air is 95%.  Philomena Doheny RN

## 2011-04-14 NOTE — Progress Notes (Signed)
Palliative Medicine Team consult requested for goals of care by Dr Elisabeth Pigeon spoke with patient's daughter, [place] at bedside at 11:45 am; she is agreeable to meeting. Dr Phillips Odor available today and will meet with daughter within the hour.   Valente David, RN 04/14/2011, 12:37 PM Palliative Medicine Team RN Liaison [phone]

## 2011-04-15 LAB — CBC
Hemoglobin: 8.1 g/dL — ABNORMAL LOW (ref 12.0–15.0)
MCH: 30.6 pg (ref 26.0–34.0)
MCHC: 35.5 g/dL (ref 30.0–36.0)
Platelets: 333 10*3/uL (ref 150–400)
RBC: 2.65 MIL/uL — ABNORMAL LOW (ref 3.87–5.11)

## 2011-04-15 LAB — BASIC METABOLIC PANEL
Calcium: 8.9 mg/dL (ref 8.4–10.5)
GFR calc non Af Amer: 25 mL/min — ABNORMAL LOW (ref >90)
Potassium: 4 mEq/L (ref 3.5–5.1)
Sodium: 138 mEq/L (ref 135–145)

## 2011-04-15 LAB — PATHOLOGIST SMEAR REVIEW

## 2011-04-15 MED ORDER — ALBUTEROL SULFATE (5 MG/ML) 0.5% IN NEBU
2.5000 mg | INHALATION_SOLUTION | Freq: Three times a day (TID) | RESPIRATORY_TRACT | Status: DC
  Administered 2011-04-15 – 2011-04-16 (×2): 2.5 mg via RESPIRATORY_TRACT
  Filled 2011-04-15 (×2): qty 0.5

## 2011-04-15 MED ORDER — SODIUM CHLORIDE 0.9 % IV BOLUS (SEPSIS)
250.0000 mL | Freq: Once | INTRAVENOUS | Status: AC
  Administered 2011-04-15: 250 mL via INTRAVENOUS

## 2011-04-15 MED ORDER — MORPHINE SULFATE 2 MG/ML IJ SOLN: 1.0000 mg | INTRAMUSCULAR | Status: DC | PRN

## 2011-04-15 MED ORDER — METHYLPREDNISOLONE SODIUM SUCC 40 MG IJ SOLR
40.0000 mg | Freq: Two times a day (BID) | INTRAMUSCULAR | Status: DC
  Administered 2011-04-15 – 2011-04-16 (×2): 40 mg via INTRAVENOUS
  Filled 2011-04-15 (×5): qty 1

## 2011-04-15 MED ORDER — SODIUM CHLORIDE 0.9 % IV SOLN
500.0000 mg | Freq: Two times a day (BID) | INTRAVENOUS | Status: DC
  Administered 2011-04-15 – 2011-04-16 (×2): 500 mg via INTRAVENOUS
  Filled 2011-04-15 (×4): qty 0.5

## 2011-04-15 MED ORDER — IPRATROPIUM BROMIDE 0.02 % IN SOLN
0.5000 mg | Freq: Three times a day (TID) | RESPIRATORY_TRACT | Status: DC
  Administered 2011-04-15 – 2011-04-16 (×2): 0.5 mg via RESPIRATORY_TRACT
  Filled 2011-04-15 (×2): qty 2.5

## 2011-04-15 MED ORDER — ACETAMINOPHEN 650 MG RE SUPP
650.0000 mg | RECTAL | Status: DC | PRN
  Administered 2011-04-15 – 2011-04-16 (×2): 650 mg via RECTAL
  Filled 2011-04-15 (×2): qty 1

## 2011-04-15 NOTE — Progress Notes (Signed)
Physical Therapy Treatment Patient Details Name: Heather Dorsey MRN: [id number] DOB: 1924/03/06 Today's Date: 04/15/2011 1545-1600 E  PT Assessment/Plan  PT - Assessment/Plan Comments on Treatment Session: pt lethargic, could only tolerate AAROM to arms and legs, c/o chest burning pain after activity.  RN notified  Follow Up Recommendations: Skilled nursing facility PT Goals  Acute Rehab PT Goals PT Goal: Supine/Side to Sit - Progress: Not progressing PT Goal: Sit to Supine/Side - Progress: Not progressing PT Goal: Sit to Stand - Progress: Not progressing PT Goal: Stand to Sit - Progress: Not progressing PT Goal: Ambulate - Progress: Not progressing  PT Treatment Precautions/Restrictions  Precautions Precautions: Fall Restrictions Weight Bearing Restrictions: No Mobility (including Balance)      Exercise  Other Exercises Other Exercises: AAROM to U/E and L/E. 5-10 reps End of Session PT - End of Session Activity Tolerance: Patient limited by pain Patient left: in bed;with call bell in reach Nurse Communication:  (pt had pain, requesting pain med) General Behavior During Session: Lethargic (pt assisted with ROM)  Donnetta Hail 04/15/2011, 4:18 PM

## 2011-04-15 NOTE — Progress Notes (Signed)
IP PROGRESS NOTE  Subjective:   She is lethargic. Opens eyes. She appears comfortable. Her daughter is at the bedside.  Objective: Vital signs in last 24 hours: Blood pressure 78/55, pulse 95, temperature 99.4 F (37.4 C), temperature source Oral, resp. rate 16, height 5\' 4"  (1.626 m), weight 158 lb (71.668 kg), SpO2 96.00%.  Intake/Output from previous day: 01/31 0701 - 02/01 0700 In: 220 [I.V.:220] Out: 101 [Urine:100; Stool:1]  Physical Exam:  Lungs: Clear anteriorly, no respiratory distress Cardiac:regular rhythm Abdomen: Soft, 1-2 cm midline upper abdominal nodular fullness  Portacath/PICC-without erythema  Lab Results:  [facility] 04/15/11 0548 04/13/11 0655  WBC 10.5 8.1  HGB 8.1* 8.5*  HCT 22.8* 24.8*  PLT 333 294    BMET  Basename 04/15/11 0548 04/14/11 0500  NA 138 141  K 4.0 4.2  CL 110 110  CO2 21 18*  GLUCOSE 105* 125*  BUN 30* 24*  CREATININE 1.78* 1.08  CALCIUM 8.9 9.1    Studies/Results: Ct Head W Wo Contrast  04/14/2011  *RADIOLOGY REPORT*  Clinical Data: Altered mental status.  Brain metastatic disease. Multiple myeloma.But no carcinoma of the pancreas.  Pulmonary metastatic disease.  CT HEAD WITHOUT AND WITH CONTRAST  Technique:  Contiguous axial images were obtained from the base of the skull through the vertex without and with intravenous contrast.  Contrast:  80 ml Omnipaque-300.  Comparison: None.  Findings: No mass lesion, mass effect, midline shift, hydrocephalus, hemorrhage.  No acute territorial cortical ischemia/infarct. Atrophy and chronic ischemic white matter disease is present.  Postcontrast imaging demonstrates no enhancing lesions.  No evidence of intracranial metastatic disease. Paranasal sinuses appear normal.  Small amount of fluid is present in the mastoid air cells.  Intracranial atherosclerosis.  No lytic lesions of the calvarium.  Chest CT dictated separately.  IMPRESSION: Atrophy and chronic ischemic white matter disease without  acute intracranial abnormality or evidence of intracranial metastatic disease.  Please note that MRI is more sensitive and CT for evaluation of metastatic disease to the brain.  Original Report Authenticated By: Andreas Newport, M.D.   Ct Chest W Contrast  04/14/2011  *RADIOLOGY REPORT*  Clinical Data: Altered mental status.  Evaluate for bronchitis. Evaluate chest for metastases versus PE.  CT CHEST WITH CONTRAST  Technique:  Multidetector CT imaging of the chest was performed following the standard protocol during bolus administration of intravenous contrast.  Contrast: 80mL OMNIPAQUE IOHEXOL 300 MG/ML IV SOLN  Comparison: Prior CT of abdomen and pelvis 04/11/2011, and prior chest CT 03/22/2011.  Findings:  Mediastinum: Right internal jugular single lumen power Port-A-Cath with tip terminating in the distal superior vena cava. Heart size is normal. There is atherosclerosis of the thoracic aorta, the great vessels of the mediastinum and the coronary arteries, including calcified atherosclerotic plaque in the the left main, LAD and right coronary arteries. No pathologically enlarged mediastinal or hilar lymph nodes. Esophagus is patulous and fluid- filled. There is no significant pericardial fluid, thickening or pericardial calcification.    No central filling defects are noted within the pulmonic trunk or main pulmonary arteries to suggest large pulmonary embolism (please note that this was not a PE protocol CT scan).  Lungs/Pleura: As with prior examinations are again several pulmonary nodules scattered throughout the lungs bilaterally.  The largest of these is in the posterior aspect of the left lower lobe (image 22 of series 11) measuring 11 x 10 mm (very similar to the prior).  An additional prominent 9 mm nodule in the lateral segment  of the right middle lobe (image 21 of series 11) is unchanged. Small left pleural effusion and trace right-sided pleural effusion are similar to prior.  There is a background  of ground-glass attenuation scattered throughout the lungs bilaterally with some mild septal thickening, suggestive of mild pulmonary edema.  No frank airspace consolidation at this time.  Areas of bronchial wall thickening and mild cylindrical bronchiectasis are noted in the lower lobes of the lungs bilaterally at the bases, likely reflects scarring from prior infection.  Upper Abdomen: Visualized portions of the upper abdomen appear very similar to recent CT of the abdomen and pelvis dated 04/11/2011 (please see that study for full description).  Musculoskeletal:  No aggressive appearing lytic or blastic lesions are noted in the visualized portions of the skeleton. 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  IMPRESSION:  1.  This examination was performed as a chest CT with contrast. While there is no large central pulmonary embolism, the examination is insufficient to exclude smaller segmental and subsegmental sized pulmonary emboli. 2.  Appearance of the lung parenchyma suggests mild pulmonary edema.  No definitive findings at this time to suggest pneumonia or bronchitis. 3.  Small left and trace right-sided pleural effusions are unchanged. 4.  Multiple pulmonary nodules, as above, unchanged compared to recent prior study. 5. Atherosclerosis, including left main and two-vessel coronary artery disease. Please note that although the presence of coronary artery calcium documents the presence of coronary artery disease, the severity of this disease and any potential stenosis cannot be assessed on this non-gated CT examination. 6.  Esophagus is patulous and fluid-filled, suggesting that the patient is a increased risk for aspiration.  Appearance of the lung bases suggests extensive scarring, likely from prior aspiration events.  Original Report Authenticated By: Florencia Reasons, M.D.   US Paracentesis  04/15/2011  *RADIOLOGY REPORT*  Clinical Data: Pancreatic cancer,  ascites; request is made for diagnostic  and therapeutic paracentesis.  ULTRASOUND GUIDED DIAGNOSTIC AND THERAPEUTIC  PARACENTESIS  An ultrasound guided paracentesis was thoroughly discussed with the patient/patient's daughter and questions answered.  The benefits, risks, alternatives and complications were also discussed.  The patient/patient's daughter understands and wishes to proceed with the procedure.  Written consent was obtained.  Ultrasound was performed to localize and mark an adequate pocket of fluid in the left lower quadrant of the abdomen.  The area was then prepped and draped in the normal sterile fashion.  1% Lidocaine was used for local anesthesia.  Under ultrasound guidance a 19 gauge Yueh catheter was introduced.  Paracentesis was performed.  The catheter was removed and a dressing applied.  Complications:  none  Findings:  A total of approximately 1.5 liters of slightly turbid, yellow fluid was removed.  A fluid sample was  sent for laboratory analysis.  IMPRESSION: Successful ultrasound guided diagnostic and therapeutic paracentesis yielding 1.5 liters of ascites.  Read by: Jeananne Rama, P.A.-C  Original Report Authenticated By: Richarda Overlie, M.D.    Medications: I have reviewed the patient's current medications.  Assessment/Plan:  1. Locally advanced pancreas cancer, currently maintained off of specific therapy.  2. Admission with nausea/vomiting and abdominal pain-potentially related to pancreas cancer, a viral infection, a urinary tract infection, or another GI process. A CT abdomen nontender 22,013 did not suggest a bowel obstruction. She has been without recurrent emesis since the NG tube was discontinued.  3. Altered mental status secondary to underlying dementia and hospital delirium-a CT of the brain on  04/14/2011 revealed no acute change.  4. History of multiple myeloma  5. Anemia secondary to chronic disease, recent bleeding, and multiple myeloma  6. History of  rheumatoid arthritis  7. Abdominal pain-most likely related to pancreas cancer  8. Urinary tract infection  9. Increased ascites on the CT 04/03/2011, potentially related to anasarca versus carcinomatosis-status post a diagnostic/therapeutic paracentesis on 04/14/2011, the cytology is pending  10. Midline abdomen nodular lesion-? Tumor  11. New wheezing/rhonchi on 04/14/2011-likely related to upper airway secretions, a CT of the chest does not reveal evidence of pneumonia.   I discussed the situation with her daughter again this morning. Her daughter is having difficulty understanding that she still has pancreas cancer. I recommend a supportive care approach with a hospice referral. I will followup on the paracentesis fluid cytology. The RN reports the patient has been in pain this morning. I will prescribe low dose morphine to be used for pain not relieved with hydrocodone.  Please call oncology as needed over the weekend. I will check on her 04/18/2011.  LOS: 6 days   Evoleth Nordmeyer BRADLEY  04/15/2011, 11:47 AM

## 2011-04-15 NOTE — Plan of Care (Signed)
Problem: Phase II Progression Outcomes Goal: Progress activity as tolerated unless otherwise ordered Outcome: Not Progressing Disease progression/increasing weakness, lethargy.

## 2011-04-15 NOTE — Progress Notes (Signed)
Occupational Therapy Note Chart reviewed. Note pt's BP 78/55 today. Spoke with nursing who advised OT to hold on tx today and check back on pt at the beginning of the week.  Judithann Sauger OTR/L [phone] 04/15/2011

## 2011-04-15 NOTE — Progress Notes (Signed)
Subjective: Patient found to be hypotensive with systolic blood pressure in the 90s According to the daughter was present in the room the patient this complaining of abdominal pain, more so often a paracentesis  She is anxiously waiting about cytology results from a paracentesis  Objective: Vital signs in last 24 hours: Filed Vitals:   04/14/11 2328 04/15/11 0318 04/15/11 0707 04/15/11 0810  BP:   78/55   Pulse:   95   Temp:   99.4 F (37.4 C)   TempSrc:   Oral   Resp:   16   Height:      Weight:      SpO2: 97% 97% 96% 96%    Intake/Output Summary (Last 24 hours) at 04/15/11 1324 Last data filed at 04/15/11 0700  Gross per 24 hour  Intake      0 ml  Output    101 ml  Net   -101 ml    Weight change:   General: in no acute distress, oriented to self only  HEENT: dry oral mucosa  Heart: Regular rate and rhythm, S1/S2 +, no murmurs, rubs, gallops.  Lungs: upper lung lobes some rhonchi appreciated without wheezing  Abdomen: Soft, nontender, nondistended, positive bowel sounds. No rebound or guarding Extremities: No clubbing or cyanosis, (+1) pitting edema, positive pedal pulses.  Neuro: Grossly nonfocal.   Lab Results: Results for orders placed during the hospital encounter of 04/09/11 (from the past 24 hour(s))  BODY FLUID CULTURE     Status: Normal (Preliminary result)   Collection Time   04/14/11  3:19 PM      Component Value Range   Specimen Description ABDOMEN ASCITES     Special Requests Normal     Gram Stain       Value: RARE WBC PRESENT,BOTH PMN AND MONONUCLEAR     NO ORGANISMS SEEN   Culture NO GROWTH     Report Status PENDING    BODY FLUID CELL COUNT WITH DIFFERENTIAL     Status: Abnormal   Collection Time   04/14/11  3:19 PM      Component Value Range   Fluid Type-FCT ABDOMEN     Color, Fluid YELLOW  YELLOW    Appearance, Fluid TURBID (*) CLEAR    WBC, Fluid 7204 (*) 0 - 1000 (cu mm)   Neutrophil Count, Fluid 91 (*) 0 - 25 (%)   Lymphs, Fluid 8     Monocyte-Macrophage-Serous Fluid 1 (*) 50 - 90 (%)  PATHOLOGIST SMEAR REVIEW     Status: Normal   Collection Time   04/14/11  3:19 PM      Component Value Range   Tech Review Reviewed by Elana Alm. Hillard, MD    MAGNESIUM     Status: Abnormal   Collection Time   04/14/11 11:07 PM      Component Value Range   Magnesium 2.9 (*) 1.5 - 2.5 (mg/dL)  CBC     Status: Abnormal   Collection Time   04/15/11  5:48 AM      Component Value Range   WBC 10.5  4.0 - 10.5 (K/uL)   RBC 2.65 (*) 3.87 - 5.11 (MIL/uL)   Hemoglobin 8.1 (*) 12.0 - 15.0 (g/dL)   HCT 16.1 (*) 09.6 - 46.0 (%)   MCV 86.0  78.0 - 100.0 (fL)   MCH 30.6  26.0 - 34.0 (pg)   MCHC 35.5  30.0 - 36.0 (g/dL)   RDW 04.5 (*) 40.9 - 15.5 (%)   Platelets  333  150 - 400 (K/uL)  BASIC METABOLIC PANEL     Status: Abnormal   Collection Time   04/15/11  5:48 AM      Component Value Range   Sodium 138  135 - 145 (mEq/L)   Potassium 4.0  3.5 - 5.1 (mEq/L)   Chloride 110  96 - 112 (mEq/L)   CO2 21  19 - 32 (mEq/L)   Glucose, Bld 105 (*) 70 - 99 (mg/dL)   BUN 30 (*) 6 - 23 (mg/dL)   Creatinine, Ser 5.40 (*) 0.50 - 1.10 (mg/dL)   Calcium 8.9  8.4 - 98.1 (mg/dL)   GFR calc non Af Amer 25 (*) >90 (mL/min)   GFR calc Af Amer 29 (*) >90 (mL/min)     Micro: Recent Results (from the past 240 hour(s))  URINE CULTURE     Status: Normal   Collection Time   04/09/11 12:37 PM      Component Value Range Status Comment   Specimen Description URINE, CATHETERIZED   Final    Special Requests NONE   Final    Culture  Setup Time [id number]   Final    Colony Count >=100,000 COLONIES/ML   Final    Culture     Final    Value: ESCHERICHIA COLI     KLEBSIELLA PNEUMONIAE   Report Status 04/12/2011 FINAL   Final    Organism ID, Bacteria ESCHERICHIA COLI   Final    Organism ID, Bacteria KLEBSIELLA PNEUMONIAE   Final   BODY FLUID CULTURE     Status: Normal (Preliminary result)   Collection Time   04/14/11  3:19 PM      Component Value Range Status Comment    Specimen Description ABDOMEN ASCITES   Final    Special Requests Normal   Final    Gram Stain     Final    Value: RARE WBC PRESENT,BOTH PMN AND MONONUCLEAR     NO ORGANISMS SEEN   Culture NO GROWTH   Final    Report Status PENDING   Incomplete     Studies/Results: Ct Head W Wo Contrast  04/14/2011  *RADIOLOGY REPORT*  Clinical Data: Altered mental status.  Brain metastatic disease. Multiple myeloma.But no carcinoma of the pancreas.  Pulmonary metastatic disease.  CT HEAD WITHOUT AND WITH CONTRAST  Technique:  Contiguous axial images were obtained from the base of the skull through the vertex without and with intravenous contrast.  Contrast:  80 ml Omnipaque-300.  Comparison: None.  Findings: No mass lesion, mass effect, midline shift, hydrocephalus, hemorrhage.  No acute territorial cortical ischemia/infarct. Atrophy and chronic ischemic white matter disease is present.  Postcontrast imaging demonstrates no enhancing lesions.  No evidence of intracranial metastatic disease. Paranasal sinuses appear normal.  Small amount of fluid is present in the mastoid air cells.  Intracranial atherosclerosis.  No lytic lesions of the calvarium.  Chest CT dictated separately.  IMPRESSION: Atrophy and chronic ischemic white matter disease without acute intracranial abnormality or evidence of intracranial metastatic disease.  Please note that MRI is more sensitive and CT for evaluation of metastatic disease to the brain.  Original Report Authenticated By: Andreas Newport, M.D.   Ct Chest W Contrast  04/14/2011  *RADIOLOGY REPORT*  Clinical Data: Altered mental status.  Evaluate for bronchitis. Evaluate chest for metastases versus PE.  CT CHEST WITH CONTRAST  Technique:  Multidetector CT imaging of the chest was performed following the standard protocol during bolus administration of intravenous contrast.  Contrast: 80mL OMNIPAQUE IOHEXOL 300 MG/ML IV SOLN  Comparison: Prior CT of abdomen and pelvis 04/11/2011, and  prior chest CT 03/22/2011.  Findings:  Mediastinum: Right internal jugular single lumen power Port-A-Cath with tip terminating in the distal superior vena cava. Heart size is normal. There is atherosclerosis of the thoracic aorta, the great vessels of the mediastinum and the coronary arteries, including calcified atherosclerotic plaque in the the left main, LAD and right coronary arteries. No pathologically enlarged mediastinal or hilar lymph nodes. Esophagus is patulous and fluid- filled. There is no significant pericardial fluid, thickening or pericardial calcification.    No central filling defects are noted within the pulmonic trunk or main pulmonary arteries to suggest large pulmonary embolism (please note that this was not a PE protocol CT scan).  Lungs/Pleura: As with prior examinations are again several pulmonary nodules scattered throughout the lungs bilaterally.  The largest of these is in the posterior aspect of the left lower lobe (image 22 of series 11) measuring 11 x 10 mm (very similar to the prior).  An additional prominent 9 mm nodule in the lateral segment of the right middle lobe (image 21 of series 11) is unchanged. Small left pleural effusion and trace right-sided pleural effusion are similar to prior.  There is a background of ground-glass attenuation scattered throughout the lungs bilaterally with some mild septal thickening, suggestive of mild pulmonary edema.  No frank airspace consolidation at this time.  Areas of bronchial wall thickening and mild cylindrical bronchiectasis are noted in the lower lobes of the lungs bilaterally at the bases, likely reflects scarring from prior infection.  Upper Abdomen: Visualized portions of the upper abdomen appear very similar to recent CT of the abdomen and pelvis dated 04/11/2011 (please see that study for full description).  Musculoskeletal:  No aggressive appearing lytic or blastic lesions are noted in the visualized portions of the skeleton. Chronic  anterior dislocation of the right sternoclavicular joint is again noted.  IMPRESSION:  1.  This examination was performed as a chest CT with contrast. While there is no large central pulmonary embolism, the examination is insufficient to exclude smaller segmental and subsegmental sized pulmonary emboli. 2.  Appearance of the lung parenchyma suggests mild pulmonary edema.  No definitive findings at this time to suggest pneumonia or bronchitis. 3.  Small left and trace right-sided pleural effusions are unchanged. 4.  Multiple pulmonary nodules, as above, unchanged compared to recent prior study. 5. Atherosclerosis, including left main and two-vessel coronary artery disease. Please note that although the presence of coronary artery calcium documents the presence of coronary artery disease, the severity of this disease and any potential stenosis cannot be assessed on this non-gated CT examination. 6.  Esophagus is patulous and fluid-filled, suggesting that the patient is a increased risk for aspiration.  Appearance of the lung bases suggests extensive scarring, likely from prior aspiration events.  Original Report Authenticated By: Florencia Reasons, M.D.   US Paracentesis  04/15/2011  *RADIOLOGY REPORT*  Clinical Data: Pancreatic cancer, ascites; request is made for diagnostic  and therapeutic paracentesis.  ULTRASOUND GUIDED DIAGNOSTIC AND THERAPEUTIC  PARACENTESIS  An ultrasound guided paracentesis was thoroughly discussed with the patient/patient's daughter and questions answered.  The benefits, risks, alternatives and complications were also discussed.  The patient/patient's daughter understands and wishes to proceed with the procedure.  Written consent was obtained.  Ultrasound was performed to localize and mark an adequate pocket of fluid in the left lower quadrant of the abdomen.  The area was then prepped and draped in the normal sterile fashion.  1% Lidocaine was used for local anesthesia.  Under ultrasound  guidance a 19 gauge Yueh catheter was introduced.  Paracentesis was performed.  The catheter was removed and a dressing applied.  Complications:  none  Findings:  A total of approximately 1.5 liters of slightly turbid, yellow fluid was removed.  A fluid sample was  sent for laboratory analysis.  IMPRESSION: Successful ultrasound guided diagnostic and therapeutic paracentesis yielding 1.5 liters of ascites.  Read by: Jeananne Rama, P.A.-C  Original Report Authenticated By: Richarda Overlie, M.D.    Medications:  Scheduled Meds:   . donepezil  5 mg Oral Daily  . furosemide  40 mg Intravenous Once  . ipratropium  0.5 mg Nebulization Q4H  . pantoprazole sodium  40 mg Oral Q1200  . predniSONE  5 mg Oral Q breakfast  . DISCONTD: levofloxacin  750 mg Oral Q48H  . DISCONTD: piperacillin-tazobactam (ZOSYN)  IV  3.375 g Intravenous Q8H  . DISCONTD: vancomycin  1,000 mg Intravenous Q24H   Continuous Infusions:  PRN Meds:.acetaminophen, albuterol, alum & mag hydroxide-simeth, HYDROcodone-acetaminophen, iohexol, morphine, ondansetron (ZOFRAN) IV, ondansetron, phenol, promethazine, simethicone   Assessment:   1. Locally advanced pancreas cancer, currently maintained off of specific therapy.  2. Admission with nausea/vomiting and abdominal pain-potentially related to pancreas cancer, a viral infection, a urinary tract infection, or another GI process. A CT abdomen nontender 22,013 did not suggest a bowel obstruction. She has been without recurrent emesis since the NG tube was discontinued.  3. Altered mental status secondary to underlying dementia and hospital delirium-a CT of the brain on 04/14/2011 revealed no acute change.  4. History of multiple myeloma  5. Anemia secondary to chronic disease, recent bleeding, and multiple myeloma  6. History of rheumatoid arthritis  7. Abdominal pain-most likely related to pancreas cancer  8. Urinary tract infection discontinued antibiotics vancomycin and Zosyn  9.  Increased ascites on the CT 04/03/2011, potentially related to anasarca versus carcinomatosis-status post a diagnostic/therapeutic paracentesis on 04/14/2011, the cytology is pending , WBC COUNT ELEVATED AT > 7000. BUT PMN <100,DOUBT PERITONITIS,WILL DISCUSS WITH DR HUNG,  10. Midline abdomen nodular lesion-? Tumor  11. New wheezing/rhonchi on 04/14/2011-likely related to upper airway secretions, a CT of the chest does not reveal evidence of pneumonia. Discontinued patient's antibiotics  Discussed continued increased risk of aspiration  I discussed the situation with her daughter again this morning. I told her that her cytology is still pending. At this point her now that she is peritonitis. Will request Dr. Elnoria Howard to clarify this.  I recommend a supportive care approach with a hospice referral. Use only Tylenol as needed for pain .  Discussed the risk of continued aspiration .   LOS: 6 days   Heather Dorsey 04/15/2011, 1:24 PM

## 2011-04-15 NOTE — Progress Notes (Signed)
Nutrition Brief Note  - Pt pulled out NGT on 1/29 and it was not ordered to be replaced. Pt currently NPO. Pt had 1.5 yellow fluid removed yesterday in paracentesis. Palliative team has been following pt and daughter. Noted if cancer has spread, daughter interested in pursuing hospice. Awaiting results of paracentesis to see if cancer has spread and to see what goals care will be.   Dietitian # [phone]

## 2011-04-15 NOTE — Progress Notes (Addendum)
Progress Note from the Palliative Medicine Team at [facility]  Assessment and Plan: 78 yo with pancreatic cancer, complicated hospital course with acute decline, UTI, now peritonitis. Patient somewhat more alert this afternoon-she is able to greet me. Still very lethargic.  New Findings: Peritoneal Fluid indicative of peritonitis, severe infection, WBC >7000 with 90% neutrophils. I question SBP vs. intra-abdominal abscess vs. Perforation. No malignant cells identified, so locally advanced pancreatic cancer-?if this is affecting celiac axis and leading to translocation of bacteria and ascites. Additionally she has biliary stent, needs to be assessed, ?soft tissue prominence on CT.  1. Continue to provide medical answers in addition to pancreatic cancer to explain the patients decline, paracentesis reveals infection, will start meropenem bacterial peritonitis. Attempt to identify source of infection, request GI assistance and interventional radiology. May need repeat abd CT or repeat US.   2. Peritoneal Infection can explain hypotension, decline, delirium, and nausea vomiting.  Plan is to start antibiotics for bacterial peritonitis, will explore options for transfer to [facility] per daughters request if patient is more stable.  3. Symptom management, cancelled swallow eval, comfort feeds AAT, discussed use of low dose morphine for pain.  Objective: Allergies  Allergen Reactions  . Bee Pollen Swelling   Scheduled Meds:   . donepezil  5 mg Oral Daily  . furosemide  40 mg Intravenous Once  . ipratropium  0.5 mg Nebulization Q4H  . pantoprazole sodium  40 mg Oral Q1200  . predniSONE  5 mg Oral Q breakfast  . DISCONTD: levofloxacin  750 mg Oral Q48H  . DISCONTD: piperacillin-tazobactam (ZOSYN)  IV  3.375 g Intravenous Q8H  . DISCONTD: vancomycin  1,000 mg Intravenous Q24H   Continuous Infusions:  PRN Meds:.acetaminophen, albuterol, alum & mag hydroxide-simeth, HYDROcodone-acetaminophen, iohexol,  morphine, ondansetron (ZOFRAN) IV, ondansetron, phenol, promethazine, simethicone  BP 78/55  Pulse 95  Temp(Src) 99.4 F (37.4 C) (Oral)  Resp 16  Ht 5\' 4"  (1.626 m)  Wt 71.668 kg (158 lb)  BMI 27.12 kg/m2  SpO2 96%    Intake/Output Summary (Last 24 hours) at 04/15/11 1343 Last data filed at 04/15/11 0700  Gross per 24 hour  Intake      0 ml  Output    101 ml  Net   -101 ml      Labs: CBC    Component Value Date/Time   WBC 10.5 04/15/2011 0548   WBC 6.5 02/16/2011 1119   RBC 2.65* 04/15/2011 0548   RBC 3.47* 02/16/2011 1119   HGB 8.1* 04/15/2011 0548   HGB 10.9* 02/16/2011 1119   HCT 22.8* 04/15/2011 0548   HCT 32.7* 02/16/2011 1119   PLT 333 04/15/2011 0548   PLT 149 02/16/2011 1119   MCV 86.0 04/15/2011 0548   MCV 94.1 02/16/2011 1119   MCH 30.6 04/15/2011 0548   MCH 31.4 02/16/2011 1119   MCHC 35.5 04/15/2011 0548   MCHC 33.4 02/16/2011 1119   RDW 17.1* 04/15/2011 0548   RDW 15.2* 02/16/2011 1119   LYMPHSABS 0.3* 04/12/2011 0548   LYMPHSABS 0.3* 02/16/2011 1119   MONOABS 0.3 04/12/2011 0548   MONOABS 0.5 02/16/2011 1119   EOSABS 0.1 04/12/2011 0548   EOSABS 0.1 02/16/2011 1119   BASOSABS 0.0 04/12/2011 0548   BASOSABS 0.0 02/16/2011 1119    BMET    Component Value Date/Time   NA 138 04/15/2011 0548   K 4.0 04/15/2011 0548   CL 110 04/15/2011 0548   CO2 21 04/15/2011 0548   GLUCOSE 105* 04/15/2011 0548  BUN 30* 04/15/2011 0548   CREATININE 1.78* 04/15/2011 0548   CALCIUM 8.9 04/15/2011 0548   GFRNONAA 25* 04/15/2011 0548   GFRAA 29* 04/15/2011 0548    CMP     Component Value Date/Time   NA 138 04/15/2011 0548   K 4.0 04/15/2011 0548   CL 110 04/15/2011 0548   CO2 21 04/15/2011 0548   GLUCOSE 105* 04/15/2011 0548   BUN 30* 04/15/2011 0548   CREATININE 1.78* 04/15/2011 0548   CALCIUM 8.9 04/15/2011 0548   PROT 6.4 04/10/2011 0300   ALBUMIN 2.4* 04/10/2011 0300   AST 16 04/10/2011 0300   ALT 19 04/10/2011 0300   ALKPHOS 227* 04/10/2011 0300   BILITOT 0.8 04/10/2011 0300   GFRNONAA 25* 04/15/2011 0548   GFRAA  29* 04/15/2011 0548    1   Time In Time Out Total Time Spent with Patient Total Overall Time     35 mintues    Greater than 50%  of this time was spent counseling and coordinating care related to the above assessment and plan.     1

## 2011-04-15 NOTE — Progress Notes (Signed)
  Speech Language/Pathology 1321 SLP Cancellation Note  Treatment cancelled today, SLP spoke to Dr Phillips Odor, Palliative care, regarding order.  Dr Phillips Odor advised to cancel order and she will reorder if indicated.   Note per pt's CT Chest, pt with patulous esophagus and was fluid filled, which likely contributes to pt's aspiration risk.   Thanks. Donavan Burnet, MS [facility] SLP [phone]

## 2011-04-15 NOTE — Progress Notes (Signed)
Patient had 2 large bowel movements during the day shift.  The second was urgent, large and watery, greenish in color.  As the patient was turned, she had several large belches, and reported feeling much improved.   A short while later, she was more interactive than she had been all day.    Patient is no longer able to get out of bed to the bedside commode.

## 2011-04-15 NOTE — Progress Notes (Signed)
ANTIBIOTIC CONSULT NOTE - INITIAL  Pharmacy Consult for Vancomycin/Zosyn/Levaquin Indication: ?HAP vs aspiration PNA  Allergies  Allergen Reactions  . Bee Pollen Swelling    Patient Measurements: Height: 5\' 4"  (162.6 cm) Weight: 158 lb (71.668 kg) IBW/kg (Calculated) : 54.7   Vital Signs: Temp: 99.4 F (37.4 C) (02/01 0707) Temp src: Oral (02/01 0707) BP: 78/55 mmHg (02/01 0707) Pulse Rate: 95  (02/01 0707) Intake/Output from previous day: 01/31 0701 - 02/01 0700 In: 220 [I.V.:220] Out: 101 [Urine:100; Stool:1] Intake/Output from this shift:    Labs:  Basename 04/15/11 0548 04/14/11 0500 04/13/11 0655  WBC 10.5 -- 8.1  HGB 8.1* -- 8.5*  PLT 333 -- 294  LABCREA -- -- --  CREATININE 1.78* 1.08 0.91   Estimated Creatinine Clearance: 22.4 ml/min (by C-G formula based on Cr of 1.78). No results found for this basename: VANCOTROUGH:2,VANCOPEAK:2,VANCORANDOM:2,GENTTROUGH:2,GENTPEAK:2,GENTRANDOM:2,TOBRATROUGH:2,TOBRAPEAK:2,TOBRARND:2,AMIKACINPEAK:2,AMIKACINTROU:2,AMIKACIN:2, in the last 72 hours   Microbiology: Recent Results (from the past 720 hour(s))  MRSA PCR SCREENING     Status: Normal   Collection Time   03/22/11  9:56 PM      Component Value Range Status Comment   MRSA by PCR NEGATIVE  NEGATIVE  Final   CLOSTRIDIUM DIFFICILE BY PCR     Status: Normal   Collection Time   03/23/11  8:47 AM      Component Value Range Status Comment   C difficile by pcr NEGATIVE  NEGATIVE  Final   URINE CULTURE     Status: Normal   Collection Time   04/09/11 12:37 PM      Component Value Range Status Comment   Specimen Description URINE, CATHETERIZED   Final    Special Requests NONE   Final    Culture  Setup Time [id number]   Final    Colony Count >=100,000 COLONIES/ML   Final    Culture     Final    Value: ESCHERICHIA COLI     KLEBSIELLA PNEUMONIAE   Report Status 04/12/2011 FINAL   Final    Organism ID, Bacteria ESCHERICHIA COLI   Final    Organism ID, Bacteria KLEBSIELLA  PNEUMONIAE   Final   BODY FLUID CULTURE     Status: Normal (Preliminary result)   Collection Time   04/14/11  3:19 PM      Component Value Range Status Comment   Specimen Description ABDOMEN ASCITES   Final    Special Requests Normal   Final    Gram Stain     Final    Value: RARE WBC PRESENT,BOTH PMN AND MONONUCLEAR     NO ORGANISMS SEEN   Culture NO GROWTH   Final    Report Status PENDING   Incomplete     Medical History: Past Medical History  Diagnosis Date  . Allergic rhinitis   . Bronchitis, mucopurulent recurrent   . Pulmonary fibrosis   . Lumbar back pain   . Osteopenia   . Arteriosclerotic dementia   . Paresthesia   . Multiple myeloma   . Anxiety   . PONV (postoperative nausea and vomiting)   . DJD (degenerative joint disease)   . Seronegative rheumatoid arthritis     knees  . Adenocarcinoma of pancreas     in remission    Medications:  Scheduled:     . donepezil  5 mg Oral Daily  . furosemide  40 mg Intravenous Once  . ipratropium  0.5 mg Nebulization Q4H  . levofloxacin  750 mg Oral Q48H  . pantoprazole  sodium  40 mg Oral Q1200  . piperacillin-tazobactam (ZOSYN)  IV  3.375 g Intravenous Q8H  . predniSONE  5 mg Oral Q breakfast  . vancomycin  1,000 mg Intravenous Q24H  . DISCONTD: levofloxacin  750 mg Oral Daily   Assessment:  Day #4 Levaquin for Ecoli/Klebsiella UTI  Day #2 Vancomycin/Zosyn for HAP vs aspiration PNA  SCr has significantly risen after only 1 dose of Vancomycin yesterday (1.08 to 1.78)  Afebrile  WBC increased a little but WNL  Note that patient received 4 doses of Vancomycin 750mg  IV q12 from 1/27 to 1/29 and also was getting Zosyn 3.375g IV q8 (extended interval infusion) from 1/27 to 1/29 as well  Goal of Therapy:  Vancomycin trough level 15-20 mcg/ml and Zosyn adjustment per changing renal function  Plan:  1. Continue Zosyn 3.375g IV q8 (extended interval infusion) for CrCl > 20 ml/min 2.  Will hold Vancomycin for now and  check a vancomycin level just to check to see if actually clearing the drug at all with worsening renal function. Will restart Vancomycin if Level < 20 mcg/mL  Berkley Harvey, PharmD, BCPS 04/15/2011,7:46 AM

## 2011-04-16 ENCOUNTER — Inpatient Hospital Stay (HOSPITAL_COMMUNITY): Payer: Medicare Other

## 2011-04-16 DIAGNOSIS — C259 Malignant neoplasm of pancreas, unspecified: Secondary | ICD-10-CM

## 2011-04-16 LAB — BASIC METABOLIC PANEL
CO2: 19 mEq/L (ref 19–32)
Calcium: 9.5 mg/dL (ref 8.4–10.5)
Chloride: 109 mEq/L (ref 96–112)
Glucose, Bld: 111 mg/dL — ABNORMAL HIGH (ref 70–99)
Sodium: 140 mEq/L (ref 135–145)

## 2011-04-16 LAB — CBC
Hemoglobin: 8.4 g/dL — ABNORMAL LOW (ref 12.0–15.0)
MCH: 29.6 pg (ref 26.0–34.0)
MCV: 85.2 fL (ref 78.0–100.0)
Platelets: 371 10*3/uL (ref 150–400)
RBC: 2.84 MIL/uL — ABNORMAL LOW (ref 3.87–5.11)
WBC: 11.3 10*3/uL — ABNORMAL HIGH (ref 4.0–10.5)

## 2011-04-16 MED ORDER — MORPHINE SULFATE 2 MG/ML IJ SOLN
1.0000 mg | Freq: Once | INTRAMUSCULAR | Status: AC
  Administered 2011-04-16: 1 mg via INTRAVENOUS
  Filled 2011-04-16 (×2): qty 1

## 2011-04-16 MED ORDER — SODIUM CHLORIDE 0.9 % IV BOLUS (SEPSIS)
250.0000 mL | Freq: Once | INTRAVENOUS | Status: AC
  Administered 2011-04-16: 250 mL via INTRAVENOUS

## 2011-04-16 MED ORDER — MORPHINE SULFATE 2 MG/ML IJ SOLN: 1.0000 mg | INTRAMUSCULAR | Status: DC | PRN

## 2011-04-16 MED ORDER — SODIUM CHLORIDE 0.9 % IV SOLN
80.0000 mg | Freq: Two times a day (BID) | INTRAVENOUS | Status: DC
  Filled 2011-04-16: qty 80

## 2011-04-16 MED ORDER — SODIUM CHLORIDE 0.9 % IV SOLN
INTRAVENOUS | Status: DC
  Administered 2011-04-16: 11:00:00 via INTRAVENOUS

## 2011-04-16 MED ORDER — PANTOPRAZOLE SODIUM 40 MG IV SOLR
40.0000 mg | Freq: Two times a day (BID) | INTRAVENOUS | Status: DC
  Administered 2011-04-16: 40 mg via INTRAVENOUS
  Filled 2011-04-16 (×2): qty 40

## 2011-04-16 MED ORDER — MORPHINE SULFATE 2 MG/ML IJ SOLN
INTRAMUSCULAR | Status: AC
  Filled 2011-04-16: qty 1

## 2011-04-16 MED ORDER — HYOSCYAMINE SULFATE 0.5 MG/ML IJ SOLN
0.5000 mg | INTRAMUSCULAR | Status: DC | PRN
  Administered 2011-04-16: 0.5 mg via INTRAVENOUS
  Filled 2011-04-16: qty 1

## 2011-04-16 NOTE — Discharge Summary (Signed)
Physician Discharge Summary  Heather Dorsey MRN: [id number] DOB/AGE: December 11, 1923 78 y.o.  PCP: Michele Mcalpine, MD, MD   Admit date: 04/09/2011 Discharge date: 04/16/2011  peritonitis/SBP  Locally advanced pancreas cancer   Allergic rhinitis    .  Bronchitis, mucopurulent recurrent    .  Pulmonary fibrosis    .  Lumbar back pain    .  Osteopenia    .  Arteriosclerotic dementia    .  Paresthesia    .  Multiple myeloma    .  Anxiety    .  PONV (postoperative nausea and vomiting)    .  DJD (degenerative joint disease)    .  Seronegative rheumatoid arthritis      knees   .  Adenocarcinoma of pancreas      in remission    Past Surgical History   Procedure  Date   .  Appendectomy    .  Abdominal hysterectomy    .  Common dile duct stent  02/2010     for pancreatic cancer   .  Colonoscopy  03/24/2011     Procedure: COLONOSCOPY; Surgeon: Theda Belfast, MD; Location: [facility]; Service: Endoscopy; Laterality: N/A;      Medications at the time of discharge  Scheduled         Medication Dose/Rate,Route,Frequency Last Action      albuterol (PROVENTIL,VENTOLIN) nebulizer solution 5 mg/mL 2.5 mg, NEBULIZATION, TID Given: 02/02 1026    ipratropium (ATROVENT) nebulizer solution 0.5 mg/2 mL 0.5 mg, NEBULIZATION, TID Given: 02/02 1026    meropenem (MERREM) IV 500 mg, IV, Q12H Given: 02/02 0242    methylPREDNISolone (SOLU-MEDROL) injection 40 mg, IV, Q12H Given: 02/02 0242    morphine No Dose/Rate,  Ordered    pantoprazole (PROTONIX) IV 40 mg, IV, Q12H Given: 02/02 1105         Continuous         Medication Dose/Rate,Route,Frequency Last Action      0.9 %  sodium chloride infusion 75 mL/hr, IV, Continuous New Bag/Given: 02/02 1103         PRN         Medication Dose/Rate,Route,Frequency Last Action       morphine injection 1 mg, IV, Q2H PRN Ordered    acetaminophen (TYLENOL) suppository 650 mg, RE, Q4H PRN Given: 02/02 0124    albuterol 2.5 mg, NEBULIZATION, Q4H PRN  Given: 02/01 0809    alum & mag hydroxide-simeth 30 mL, PO, Q6H PRN Given: 02/02 0528    HYDROcodone-acetaminophen 2 tablet, PO, Q4H PRN Given: 01/31 0536    hyoscyamine (LEVSIN) injection 0.5 mg, IV, Q4H PRN Given: 02/02 1105    ondansetron 4 mg, IV, Q6H PRN Given: 02/02 0803    ondansetron 4 mg, PO, Q6H PRN Given: 01/27 1128    phenol (CHLORASEPTIC) mouth spray 1 spray, MT, PRN Ordered    promethazine (PHENERGAN) injection 25 mg/mL 6.25 mg, IV, Q6H PRN Given: 01/31 0535    simethicone 80 mg, PO, QID PRN Given: 01/26 1836        Discharge Condition: Stable Disposition: Transfer to [facility]   Consults:    Significant Diagnostic Studies: Ct Abdomen Pelvis Wo Contrast  04/11/2011  *RADIOLOGY REPORT*  Clinical Data: Adenocarcinoma of the pancreas for which the patient is undergoing chemotherapy.  Diarrhea.  Nausea and vomiting. Anorexia.  Lower abdominal pain.  CT ABDOMEN AND PELVIS WITHOUT CONTRAST 04/11/2011:  Technique:  Multidetector CT imaging of the abdomen and  pelvis was performed following the standard protocol without intravenous contrast.  Comparison: CT abdomen pelvis with contrast 03/22/2011.  Findings: Diffuse body wall edema.  Interval increase in the amount of ascites within the abdomen and pelvis, now moderately large. Stable moderately distended gallbladder.  No intra or extrahepatic biliary ductal dilation.  Indwelling common bile duct stent which it is likely patent, as there is gas within the intrahepatic bile ducts.  Mass involving the head of the pancreas, unchanged, with atrophy involving the body and tail.  Adenopathy within the celiac axis, unchanged.  Low attenuation involving the right and left lobes centrally, unchanged.  Within the limits of the unenhanced technique, no new hepatic parenchymal abnormalities.  Normal appearing spleen. Normal unenhanced appearance of the kidneys without evidence of hydronephrosis.  Moderate aorto-iliofemoral atherosclerosis  without aneurysm.  Nasogastric tube within the stomach which is decompressed and unremarkable.  Normal-appearing small bowel.  Relatively decompressed colon throughout, with persistent wall thickening involving the cecum, ascending colon, transverse colon, and descending colon.  Urinary bladder unremarkable.  Bone window images again demonstrate a mottled appearance to the thoracic and lumbar spine, degenerative changes involving the lower thoracic and lower lumbar spine, and mild degenerative changes in the hips.  Small left pleural effusion with associated passive atelectasis in the left lower lobe.  Vague nodular opacity in the right middle lobe, visualized on the initial image, unchanged.  IMPRESSION:  1.  Interval increase in the amount of ascites within the abdomen and pelvis since the prior CT 03/22/2011. 2.  Otherwise, no significant interval change. 3.  Residual or recurrent colitis. 4.  Indwelling common bile duct stent which appears patent, as there is gas within the intrahepatic bile ducts. 5.  Stable mass involving the head of the pancreas and metastatic the celiac axis lymphadenopathy. 6.  Small left pleural effusion and associated passive atelectasis in the left lower lobe.  Stable vague oval shaped nodular opacity in the right middle lobe. 7.  Anasarca.  Original Report Authenticated By: Arnell Sieving, M.D.   Dg Chest 1 View  04/09/2011  *RADIOLOGY REPORT*  Clinical Data: Short of breath  CHEST - 1 VIEW  Comparison: Chest radiograph 03/22/2011  Findings: There is a power port in the right chest wall with tip in distal SVC.  Stable cardiac silhouette.  There are low lung volumes similar to prior.  No pulmonary edema.  Basilar atelectasis is present.  No focal infiltrate.  IMPRESSION:  1.  No significant change. 2.  Low lung volumes and basilar atelectasis.  Original Report Authenticated By: Genevive Bi, M.D.   Dg Chest 1 View  03/22/2011  *RADIOLOGY REPORT*  Clinical Data: Short of  breath.  Pulmonary fibrosis.  Pancreatic cancer.  CHEST - 1 VIEW  Comparison: 11/04/2010.  Findings: Patient is rotated to the right.  Right IJ Port-A- Cath/power port is unchanged.  Low lung volumes with basilar atelectasis.  No airspace disease.  No effusion.  Cardiopericardial silhouette is probably unchanged allowing for low inspiratory volumes.  IMPRESSION: Low volume chest.  Stable support apparatus.  No gross consolidation.  Original Report Authenticated By: Andreas Newport, M.D.   Abd 1 View (kub)  04/09/2011  *RADIOLOGY REPORT*  Clinical Data: Nausea and vomiting.  Pancreatic carcinoma.  ABDOMEN - 1 VIEW  Comparison: 11/04/2010  Findings: A paucity of bowel gas is seen.  Internal biliary wall stent remains in place.  No definite radiopaque calculi identified. Vascular calcifications seen within the pelvis.  IMPRESSION: Paucity of bowel gas noted.  Internal biliary wall stent remains in place.  Original Report Authenticated By: Danae Orleans, M.D.   Ct Head W Wo Contrast  04/14/2011  *RADIOLOGY REPORT*  Clinical Data: Altered mental status.  Brain metastatic disease. Multiple myeloma.But no carcinoma of the pancreas.  Pulmonary metastatic disease.  CT HEAD WITHOUT AND WITH CONTRAST  Technique:  Contiguous axial images were obtained from the base of the skull through the vertex without and with intravenous contrast.  Contrast:  80 ml Omnipaque-300.  Comparison: None.  Findings: No mass lesion, mass effect, midline shift, hydrocephalus, hemorrhage.  No acute territorial cortical ischemia/infarct. Atrophy and chronic ischemic white matter disease is present.  Postcontrast imaging demonstrates no enhancing lesions.  No evidence of intracranial metastatic disease. Paranasal sinuses appear normal.  Small amount of fluid is present in the mastoid air cells.  Intracranial atherosclerosis.  No lytic lesions of the calvarium.  Chest CT dictated separately.  IMPRESSION: Atrophy and chronic ischemic white matter  disease without acute intracranial abnormality or evidence of intracranial metastatic disease.  Please note that MRI is more sensitive and CT for evaluation of metastatic disease to the brain.  Original Report Authenticated By: Andreas Newport, M.D.   Ct Chest W Contrast  04/14/2011  *RADIOLOGY REPORT*  Clinical Data: Altered mental status.  Evaluate for bronchitis. Evaluate chest for metastases versus PE.  CT CHEST WITH CONTRAST  Technique:  Multidetector CT imaging of the chest was performed following the standard protocol during bolus administration of intravenous contrast.  Contrast: 80mL OMNIPAQUE IOHEXOL 300 MG/ML IV SOLN  Comparison: Prior CT of abdomen and pelvis 04/11/2011, and prior chest CT 03/22/2011.  Findings:  Mediastinum: Right internal jugular single lumen power Port-A-Cath with tip terminating in the distal superior vena cava. Heart size is normal. There is atherosclerosis of the thoracic aorta, the great vessels of the mediastinum and the coronary arteries, including calcified atherosclerotic plaque in the the left main, LAD and right coronary arteries. No pathologically enlarged mediastinal or hilar lymph nodes. Esophagus is patulous and fluid- filled. There is no significant pericardial fluid, thickening or pericardial calcification.    No central filling defects are noted within the pulmonic trunk or main pulmonary arteries to suggest large pulmonary embolism (please note that this was not a PE protocol CT scan).  Lungs/Pleura: As with prior examinations are again several pulmonary nodules scattered throughout the lungs bilaterally.  The largest of these is in the posterior aspect of the left lower lobe (image 22 of series 11) measuring 11 x 10 mm (very similar to the prior).  An additional prominent 9 mm nodule in the lateral segment of the right middle lobe (image 21 of series 11) is unchanged. Small left pleural effusion and trace right-sided pleural effusion are similar to prior.  There  is a background of ground-glass attenuation scattered throughout the lungs bilaterally with some mild septal thickening, suggestive of mild pulmonary edema.  No frank airspace consolidation at this time.  Areas of bronchial wall thickening and mild cylindrical bronchiectasis are noted in the lower lobes of the lungs bilaterally at the bases, likely reflects scarring from prior infection.  Upper Abdomen: Visualized portions of the upper abdomen appear very similar to recent CT of the abdomen and pelvis dated 04/11/2011 (please see that study for full description).  Musculoskeletal:  No aggressive appearing lytic or blastic lesions are noted in the visualized portions of the skeleton. 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  IMPRESSION:  1.  This  examination was performed as a chest CT with contrast. While there is no large central pulmonary embolism, the examination is insufficient to exclude smaller segmental and subsegmental sized pulmonary emboli. 2.  Appearance of the lung parenchyma suggests mild pulmonary edema.  No definitive findings at this time to suggest pneumonia or bronchitis. 3.  Small left and trace right-sided pleural effusions are unchanged. 4.  Multiple pulmonary nodules, as above, unchanged compared to recent prior study. 5. Atherosclerosis, including left main and two-vessel coronary artery disease. Please note that although the presence of coronary artery calcium documents the presence of coronary artery disease, the severity of this disease and any potential stenosis cannot be assessed on this non-gated CT examination. 6.  Esophagus is patulous and fluid-filled, suggesting that the patient is a increased risk for aspiration.  Appearance of the lung bases suggests extensive scarring, likely from prior aspiration events.  Original Report Authenticated By: Florencia Reasons, M.D.   Ct Angio Chest W/cm &/or Wo Cm  03/22/2011  *RADIOLOGY REPORT*  Clinical  Data:  Shortness of breath and rectal bleeding.  CT ANGIOGRAPHY CHEST CT ABDOMEN AND PELVIS WITH CONTRAST  Technique:  Multidetector CT imaging of the chest was performed using the standard protocol during bolus administration of intravenous contrast.  Multiplanar CT image reconstructions including MIPs were obtained to evaluate the vascular anatomy. Multidetector CT imaging of the abdomen and pelvis was performed using the standard protocol during bolus administration of intravenous contrast.  Contrast: OMNIPAQUE IOHEXOL 300 MG/ML IV SOLN  Comparison:  CT chest 02/10/2010.  CTA CHEST  Findings:  The chest wall is unremarkable.  No breast masses, supraclavicular or axillary lymphadenopathy.  A right-sided Port-A- Cath is noted.  The bony thorax is intact.  Lytic and sclerotic appearing bone changes suspicious for metastatic disease.  The heart is normal in size.  No pericardial effusion.  There are scattered mediastinal and hilar lymph nodes but no adenopathy.  The esophagus is grossly normal.  The aorta is tortuous and mildly ectatic with atherosclerotic changes but no dissection or focal aneurysm.  Dense coronary artery calcifications are noted.  The pulmonary arterial tree is fairly well opacified.  No filling defects to suggest pulmonary emboli.  There is noted in the left brachiocephalic vein likely from the injection.  Examination of the lung parenchyma demonstrates pulmonary scarring and peribronchial thickening.  Vague nodular lung lesions.  Cannot exclude metastatic disease.  No edema or effusions.   Review of the MIP images confirms the above findings.  IMPRESSION:  1.  No CT findings for pulmonary embolism. 2.  No mediastinal or hilar adenopathy. 3.  Scattered ill-defined pulmonary lesions.  Pulmonary metastatic disease is possible.  Close CT follow-up is recommended.  CT ABDOMEN AND PELVIS  Findings: A biliary stent is in place.  There is associated intrahepatic biliary air.  There is a large low  attenuation area in the medial segment of the left hepatic lobe and also in part the right hepatic lobe.   This was not present on the prior examination.  It could be a treated lesion.  Vessels and the biliary tree run through this area and severe focal fat is possible.  Comparison with any more recent CT scan may be helpful. The gallbladder is markedly distended and gallbladder folds are noted.  Cystic duct obstruction is possible although no definite wall thickening or gallstones.  The pancreatic head demonstrates diffuse low attenuation without discrete mass.  The pancreatic body and tail are quite atrophied.  There is ill-defined soft tissue density surrounding the right side of the celiac access which is likely tumor.  Scattered small lymph nodes are noted also.  The spleen is normal in size.  No focal lesions.  The adrenal glands and kidneys are unremarkable.  The stomach is grossly normal.  The duodenum and small bowel are unremarkable.  The colon demonstrates severe and diffuse inflammation most consistent with C difficile colitis.  The surrounding inflammation and fluid.  The bladder has a Foley catheter in it.  Mild wall thickening. There is moderate free pelvic fluid.  No pelvic mass or adenopathy.  Very heterogeneous appearance of the bone marrow.  Cannot exclude metastatic disease.  Review of the MIP images confirms the above findings.  IMPRESSION:  1.  Large area of low attenuation in the liver could be treated disease or severe focal fat.  Vessels and the biliary tree run through this area so I do not think it is mass.  MRI may be helpful for further evaluation. 2.  Marked distention of the gallbladder. 3.  Ill-defined low attenuation of the pancreatic head with adjacent soft tissue tumor and small lymph nodes. 4.  Severe diffuse inflammation of the colon most consistent with C difficile colitis. 5.  Moderate amount of free pelvic fluid. 6.  Biliary stent in place.  Original Report Authenticated By:  P. Loralie Champagne, M.D.   US Abdomen Complete  03/22/2011  *RADIOLOGY REPORT*  Clinical Data:  Abdominal pain, new transaminitis.  History of pancreatic cancer status post biliary stent in 2011  COMPLETE ABDOMINAL ULTRASOUND  Comparison:  None.  Findings:  Gallbladder:  Gallbladder sludge.  No gallbladder wall thickening, pericholecystic fluid, or sonographic Murphy's sign.  Common bile duct:  Measures 10 mm.  Liver:  No focal lesion identified.  Pneumobilia, likely secondary to indwelling biliary stent.  IVC:  Appears normal.  Pancreas:  Poorly visualized.  Pancreatic duct stent in place.  Spleen:  Measures 8.1 cm.  Right Kidney:  Measures 9.6 cm.  No mass or hydronephrosis.  Left Kidney:  Measures 9.6 cm.  No mass or hydronephrosis.  Abdominal aorta:  No aneurysm identified.  Additional comments:  Ascites.  IMPRESSION: Pancreatic duct stent in place.  Secondary pneumobilia.  Gallbladder sludge, without associated findings to suggest acute cholecystitis.  Ascites.  Original Report Authenticated By: Charline Bills, M.D.   Ct Abdomen Pelvis W Contrast  03/22/2011  *RADIOLOGY REPORT*  Clinical Data:  Shortness of breath and rectal bleeding.  CT ANGIOGRAPHY CHEST CT ABDOMEN AND PELVIS WITH CONTRAST  Technique:  Multidetector CT imaging of the chest was performed using the standard protocol during bolus administration of intravenous contrast.  Multiplanar CT image reconstructions including MIPs were obtained to evaluate the vascular anatomy. Multidetector CT imaging of the abdomen and pelvis was performed using the standard protocol during bolus administration of intravenous contrast.  Contrast: OMNIPAQUE IOHEXOL 300 MG/ML IV SOLN  Comparison:  CT chest 02/10/2010.  CTA CHEST  Findings:  The chest wall is unremarkable.  No breast masses, supraclavicular or axillary lymphadenopathy.  A right-sided Port-A- Cath is noted.  The bony thorax is intact.  Lytic and sclerotic appearing bone changes suspicious for  metastatic disease.  The heart is normal in size.  No pericardial effusion.  There are scattered mediastinal and hilar lymph nodes but no adenopathy.  The esophagus is grossly normal.  The aorta is tortuous and mildly ectatic with atherosclerotic changes but no dissection or focal aneurysm.  Dense coronary artery calcifications  are noted.  The pulmonary arterial tree is fairly well opacified.  No filling defects to suggest pulmonary emboli.  There is noted in the left brachiocephalic vein likely from the injection.  Examination of the lung parenchyma demonstrates pulmonary scarring and peribronchial thickening.  Vague nodular lung lesions.  Cannot exclude metastatic disease.  No edema or effusions.   Review of the MIP images confirms the above findings.  IMPRESSION:  1.  No CT findings for pulmonary embolism. 2.  No mediastinal or hilar adenopathy. 3.  Scattered ill-defined pulmonary lesions.  Pulmonary metastatic disease is possible.  Close CT follow-up is recommended.  CT ABDOMEN AND PELVIS  Findings: A biliary stent is in place.  There is associated intrahepatic biliary air.  There is a large low attenuation area in the medial segment of the left hepatic lobe and also in part the right hepatic lobe.   This was not present on the prior examination.  It could be a treated lesion.  Vessels and the biliary tree run through this area and severe focal fat is possible.  Comparison with any more recent CT scan may be helpful. The gallbladder is markedly distended and gallbladder folds are noted.  Cystic duct obstruction is possible although no definite wall thickening or gallstones.  The pancreatic head demonstrates diffuse low attenuation without discrete mass.  The pancreatic body and tail are quite atrophied.  There is ill-defined soft tissue density surrounding the right side of the celiac access which is likely tumor.  Scattered small lymph nodes are noted also.  The spleen is normal in size.  No focal lesions.  The  adrenal glands and kidneys are unremarkable.  The stomach is grossly normal.  The duodenum and small bowel are unremarkable.  The colon demonstrates severe and diffuse inflammation most consistent with C difficile colitis.  The surrounding inflammation and fluid.  The bladder has a Foley catheter in it.  Mild wall thickening. There is moderate free pelvic fluid.  No pelvic mass or adenopathy.  Very heterogeneous appearance of the bone marrow.  Cannot exclude metastatic disease.  Review of the MIP images confirms the above findings.  IMPRESSION:  1.  Large area of low attenuation in the liver could be treated disease or severe focal fat.  Vessels and the biliary tree run through this area so I do not think it is mass.  MRI may be helpful for further evaluation. 2.  Marked distention of the gallbladder. 3.  Ill-defined low attenuation of the pancreatic head with adjacent soft tissue tumor and small lymph nodes. 4.  Severe diffuse inflammation of the colon most consistent with C difficile colitis. 5.  Moderate amount of free pelvic fluid. 6.  Biliary stent in place.  Original Report Authenticated By: P. Loralie Champagne, M.D.   US Paracentesis  04/15/2011  *RADIOLOGY REPORT*  Clinical Data: Pancreatic cancer, ascites; request is made for diagnostic  and therapeutic paracentesis.  ULTRASOUND GUIDED DIAGNOSTIC AND THERAPEUTIC  PARACENTESIS  An ultrasound guided paracentesis was thoroughly discussed with the patient/patient's daughter and questions answered.  The benefits, risks, alternatives and complications were also discussed.  The patient/patient's daughter understands and wishes to proceed with the procedure.  Written consent was obtained.  Ultrasound was performed to localize and mark an adequate pocket of fluid in the left lower quadrant of the abdomen.  The area was then prepped and draped in the normal sterile fashion.  1% Lidocaine was used for local anesthesia.  Under ultrasound guidance a 19 gauge Griffith Citron  catheter was introduced.  Paracentesis was performed.  The catheter was removed and a dressing applied.  Complications:  none  Findings:  A total of approximately 1.5 liters of slightly turbid, yellow fluid was removed.  A fluid sample was  sent for laboratory analysis.  IMPRESSION: Successful ultrasound guided diagnostic and therapeutic paracentesis yielding 1.5 liters of ascites.  Read by: Jeananne Rama, P.A.-C  Original Report Authenticated By: Richarda Overlie, M.D.   Dg Abd Acute W/chest  04/10/2011  *RADIOLOGY REPORT*  Clinical Data: Abdominal pain.  Distention.  ACUTE ABDOMEN SERIES (ABDOMEN 2 VIEW & CHEST 1 VIEW)  Comparison: 04/09/2011  Findings: Right-sided power port noted with tip projecting over the SVC.  A nasogastric tube is present.  Very low lung volumes are present with airspace opacity in the lung bases favoring atelectasis.  A left side down lateral decubitus view of the abdomen does not demonstrate free intraperitoneal gas.  A biliary stent is observed. Air-fluid level in an unidentifiable loop of central abdominal bowel could be in the stomach, small bowel, or colon.  Most of the bowel is gasless.  IMPRESSION:  1.  Predominately gasless bowel.  No definite dilated bowel is noted.  A single air fluid level on the lateral decubitus view the abdomen is in unidentifiable bowel, possibly colon, stomach, or small bowel. 2.  Biliary stent. 3.  Very low lung volumes, with bibasilar airspace opacities favoring atelectasis over pneumonia.  Original Report Authenticated By: Dellia Cloud, M.D.    Microbiology: Recent Results (from the past 240 hour(s))  URINE CULTURE     Status: Normal   Collection Time   04/09/11 12:37 PM      Component Value Range Status Comment   Specimen Description URINE, CATHETERIZED   Final    Special Requests NONE   Final    Culture  Setup Time [id number]   Final    Colony Count >=100,000 COLONIES/ML   Final    Culture     Final    Value: ESCHERICHIA COLI      KLEBSIELLA PNEUMONIAE   Report Status 04/12/2011 FINAL   Final    Organism ID, Bacteria ESCHERICHIA COLI   Final    Organism ID, Bacteria KLEBSIELLA PNEUMONIAE   Final   BODY FLUID CULTURE     Status: Normal (Preliminary result)   Collection Time   04/14/11  3:19 PM      Component Value Range Status Comment   Specimen Description ABDOMEN ASCITES   Final    Special Requests Normal   Final    Gram Stain     Final    Value: RARE WBC PRESENT,BOTH PMN AND MONONUCLEAR     NO ORGANISMS SEEN   Culture NO GROWTH   Final    Report Status PENDING   Incomplete      Labs: Results for orders placed during the hospital encounter of 04/09/11 (from the past 48 hour(s))  BODY FLUID CULTURE     Status: Normal (Preliminary result)   Collection Time   04/14/11  3:19 PM      Component Value Range Comment   Specimen Description ABDOMEN ASCITES      Special Requests Normal      Gram Stain        Value: RARE WBC PRESENT,BOTH PMN AND MONONUCLEAR     NO ORGANISMS SEEN   Culture NO GROWTH      Report Status PENDING     BODY FLUID CELL COUNT WITH DIFFERENTIAL  Status: Abnormal   Collection Time   04/14/11  3:19 PM      Component Value Range Comment   Fluid Type-FCT ABDOMEN      Color, Fluid YELLOW  YELLOW     Appearance, Fluid TURBID (*) CLEAR     WBC, Fluid 7204 (*) 0 - 1000 (cu mm)    Neutrophil Count, Fluid 91 (*) 0 - 25 (%)    Lymphs, Fluid 8      Monocyte-Macrophage-Serous Fluid 1 (*) 50 - 90 (%)   PATHOLOGIST SMEAR REVIEW     Status: Normal   Collection Time   04/14/11  3:19 PM      Component Value Range Comment   Tech Review Reviewed by Elana Alm. Hillard, MD     MAGNESIUM     Status: Abnormal   Collection Time   04/14/11 11:07 PM      Component Value Range Comment   Magnesium 2.9 (*) 1.5 - 2.5 (mg/dL)   CBC     Status: Abnormal   Collection Time   04/15/11  5:48 AM      Component Value Range Comment   WBC 10.5  4.0 - 10.5 (K/uL)    RBC 2.65 (*) 3.87 - 5.11 (MIL/uL)    Hemoglobin 8.1  (*) 12.0 - 15.0 (g/dL)    HCT 96.0 (*) 45.4 - 46.0 (%)    MCV 86.0  78.0 - 100.0 (fL)    MCH 30.6  26.0 - 34.0 (pg)    MCHC 35.5  30.0 - 36.0 (g/dL)    RDW 09.8 (*) 11.9 - 15.5 (%)    Platelets 333  150 - 400 (K/uL)   BASIC METABOLIC PANEL     Status: Abnormal   Collection Time   04/15/11  5:48 AM      Component Value Range Comment   Sodium 138  135 - 145 (mEq/L)    Potassium 4.0  3.5 - 5.1 (mEq/L)    Chloride 110  96 - 112 (mEq/L)    CO2 21  19 - 32 (mEq/L)    Glucose, Bld 105 (*) 70 - 99 (mg/dL)    BUN 30 (*) 6 - 23 (mg/dL) REPEATED TO VERIFY   Creatinine, Ser 1.78 (*) 0.50 - 1.10 (mg/dL)    Calcium 8.9  8.4 - 10.5 (mg/dL)    GFR calc non Af Amer 25 (*) >90 (mL/min)    GFR calc Af Amer 29 (*) >90 (mL/min)   CBC     Status: Abnormal   Collection Time   04/16/11  5:15 AM      Component Value Range Comment   WBC 11.3 (*) 4.0 - 10.5 (K/uL)    RBC 2.84 (*) 3.87 - 5.11 (MIL/uL)    Hemoglobin 8.4 (*) 12.0 - 15.0 (g/dL)    HCT 14.7 (*) 82.9 - 46.0 (%)    MCV 85.2  78.0 - 100.0 (fL)    MCH 29.6  26.0 - 34.0 (pg)    MCHC 34.7  30.0 - 36.0 (g/dL)    RDW 56.2 (*) 13.0 - 15.5 (%)    Platelets 371  150 - 400 (K/uL)   BASIC METABOLIC PANEL     Status: Abnormal   Collection Time   04/16/11  5:15 AM      Component Value Range Comment   Sodium 140  135 - 145 (mEq/L)    Potassium 4.4  3.5 - 5.1 (mEq/L)    Chloride 109  96 - 112 (mEq/L)  CO2 19  19 - 32 (mEq/L)    Glucose, Bld 111 (*) 70 - 99 (mg/dL)    BUN 38 (*) 6 - 23 (mg/dL)    Creatinine, Ser 4.09 (*) 0.50 - 1.10 (mg/dL)    Calcium 9.5  8.4 - 10.5 (mg/dL)    GFR calc non Af Amer 21 (*) >90 (mL/min)    GFR calc Af Amer 25 (*) >90 (mL/min)      HPI :-79 year-old woman with a history of pancreatic adenocarcinoma who presented emergency on 26th of January 2013 with complaints of abdominal pain, nausea, vomiting, generalized weakness. She reports her mother has continued to have vomiting and some stomach complaints since discharge from  the hospital. She often vomits up her medications. Her mother has become more confused and complaining of stomach pain. In the outpatient setting she is also noted to have had anasarca. She was seen by her primary care physician as well as oncology as outlined below.  In the emergency department she was found to have urinary tract infection and confusion and was referred for admission. Admitted January 8-14 for GI bleed. This occurred in the context of warfarin for a basilic vein DVT. She underwent colonoscopy that time which revealed multiple polyps. CT of the abdomen at that time revealed recurrent pancreatic cancer with some pulmonary nodules consistent with metastatic disease. Anticoagulation was discontinued as repeat imaging demonstrated resolution of DVT.  She was discharged to [facility] with her daughter immediately removed her from that facility. She had been receiving home health. Since her discharge from the hospital she has complained of anasarca. She was placed on Lasix without improvement.   HOSPITAL COURSE:  #1Locally advanced pancreas cancer according to Dr. Elnita Maxwell the patient has locally advanced pancreatic cancer. She has had 2 cycles of gemcitabine. This had to be discontinued because of side effects. Repeat CT scan has shown localized celiac lymphadenopathy. Another noncontrast CT scan of the abdomen and pelvis done on 04/11/2011 showed the following  1. Interval increase in the amount of ascites within the abdomen  and pelvis since the prior CT 03/22/2011.  2. Otherwise, no significant interval change.  3. Residual or recurrent colitis.  4. Indwelling common bile duct stent which appears patent, as  there is gas within the intrahepatic bile ducts.  5. Stable mass involving the head of the pancreas and metastatic  the celiac axis lymphadenopathy.  6. Small left pleural effusion and associated passive atelectasis  in the left lower lobe. Stable vague oval shaped nodular  opacity  in the right middle lobe.  7. Anasarca.   Oncology and GI consultation was obtained. Imaging studies as documented above showed that her cancer has recurred complicated with ascites and lesions in her lung and bone suspicious for metastasis. However the daughter was not convinced that her abdominal pain is related to recurrent pancreatic cancer with ascites possible peritoneal carcinomatosis. A paracentesis was done.Peritoneal Fluid indicative of peritonitis, severe infection, WBC >7000 with 90% neutrophils. There was a question of SBP vs. intra-abdominal abscess vs. Perforation. No malignant cells identified, so locally advanced pancreatic cancer-?if this is affecting celiac axis and leading to translocation of bacteria and ascites. She was restarted on meropenem. Ascites fluid culture is pending at this time. The daughter would like for an aggressive work-up to be initiated for the underlying cause of the intra-abdominal infection and does not feel that is being achieved here at [facility]. The peritoneal fluid did not reveal malignant cells and her ca9-19 is  not elevated and scans suggest localized pancreatic cancer, still unclear how much tumor is affecting the biliary system and the current status of her stent. She has previously been followed by Dr. Bufford Buttner at [facility]. She is requesting a GI second opinion and oncology second opinion to be carried out at [facility]. I agreed with her that we had likely exhausted our options here at [facility] other than treating with IV antibiotics, repeating a CT and having Dr. Elnoria Howard evaluate the stent again.  Dr. Anderson Malta has spoken to Dr.Sampat, oncology fellow at [facility]. He has accepted the patient. Dr. Carmon Ginsberg will be supervising oncology attending. Patient will be transferred when a bed is available for  Discharge Exam:  Blood pressure 108/71, pulse 85, temperature 98.2 F (36.8 C), temperature source Oral, resp. rate 16, height 5\' 4"  (1.626 m), weight 71.668 kg (158 lb),  SpO2 98.00%.   General: in no acute distress, oriented to self only  HEENT: dry oral mucosa  Heart: Regular rate and rhythm, S1/S2 +, no murmurs, rubs, gallops.  Lungs: upper lung lobes some rhonchi appreciated without wheezing  Abdomen: Soft, nontender, nondistended, positive bowel sounds. No rebound or guarding  Extremities: No clubbing or cyanosis, (+1) pitting edema, positive pedal pulses.  Neuro: Grossly nonfocal   Discharge Orders    Future Appointments: Provider: Department: Dept Phone: Center:   04/25/2011 1:30 PM Gwenith Spitz [facility] [phone] None   04/25/2011 2:00 PM Lucile Shutters, MD [facility] [phone] None   05/18/2011 11:30 AM Michele Mcalpine, MD [facility] [phone] None        Signed: Richarda Overlie 04/16/2011, 11:02 AM

## 2011-04-16 NOTE — Progress Notes (Signed)
Pt. Was given Tylenol suppository for abdominal pain. Pt. Had a loose BM shortly afterwards. Pt.'s daughter was educated on the use of low-dose morphine for pain management and agreed to one-time dose. When morphine was brought into the room, Dtr. Stated that the Pt. Looked more comfortable and refused Morphine at that time, but agreed to possibly use it in the future.

## 2011-04-16 NOTE — Progress Notes (Signed)
Pt. C/o abdominal pain/dyspepsia. Pt.'s daughter agreed to use of 1mg  Morphine. Pt. Appears more comfortable, still c/o belching. Pt.'s daughter agrees pt. Appears more comfortable. Dt. C/o exhaustion.

## 2011-04-16 NOTE — Progress Notes (Signed)
Progress Note from the Palliative Medicine Team at [facility]  I met with patient and daughter [place] Sink) regarding her mothers current diagnosis, care, prognosis and next steps in her plan of care. Currently, the patient is being treated for peritonitis/SBP based on peritoneal fluid analysis. The daughter would like for an aggressive work-up to be initiated for the underlying cause of the intra-abdominal infection and does not feel that is being achieved here at [facility]. The peritoneal fluid did not reveal malignant cells and her ca9-19 is not elevated and scans suggest localized pancreatic cancer, still unclear how much tumor is affecting the biliary system and the current status of her stent. She has previously been followed by Dr. Bufford Buttner at [facility]. She is requesting a GI second opinion and oncology second opinion to be carried out at [facility]. I agreed with her that we had likely exhausted our options here at [facility] other than treating with IV antibiotics, repeating a CT and having Dr. Elnoria Howard evaluate the stent again.  Her mother continues to have significant abdominal pain and spasm, belching and generalized weakness. Active bowel sounds, appears in mild distress but more talkative today.  Plan:  1. I have placed a call to [facility] transfer center and spoke to the Oncology Fellow who has accepted the patient in transfer. [place] Sink wants her to be transferred on Sunday so that she can get her mothers things situated at home. Face sheet being faxed to transfer center.  2. Goals of transfer are to further characterize the status of the patients, pancreatic cancer, continue antibiotic management and the major goal for family is to find an explanation for the peritoneal infection and get [facility] GI consult.  3. For symptom management I have increased the frequency of Morphine, added IV Levsin for GI spasm, and increased her protonix IV to decrease gastric secretions.   Objective: Allergies  Allergen Reactions  . Bee Pollen Swelling     Scheduled Meds:   . albuterol  2.5 mg Nebulization TID  . ipratropium  0.5 mg Nebulization TID  . meropenem (MERREM) IV  500 mg Intravenous Q12H  . methylPREDNISolone (SOLU-MEDROL) injection  40 mg Intravenous Q12H  .  morphine injection  1 mg Intravenous Once  . morphine      . pantoprazole (PROTONIX) IV  40 mg Intravenous Q12H  . sodium chloride  250 mL Intravenous Once  . sodium chloride  250 mL Intravenous Once  . DISCONTD: donepezil  5 mg Oral Daily  . DISCONTD: ipratropium  0.5 mg Nebulization Q4H  . DISCONTD: levofloxacin  750 mg Oral Q48H  . DISCONTD: pantoprazole (PROTONIX) IV  80 mg Intravenous Q12H  . DISCONTD: pantoprazole sodium  40 mg Oral Q1200  . DISCONTD: piperacillin-tazobactam (ZOSYN)  IV  3.375 g Intravenous Q8H  . DISCONTD: predniSONE  5 mg Oral Q breakfast   Continuous Infusions:   . sodium chloride     PRN Meds:.acetaminophen, albuterol, alum & mag hydroxide-simeth, HYDROcodone-acetaminophen, hyoscyamine, morphine injection, ondansetron (ZOFRAN) IV, ondansetron, phenol, promethazine, simethicone, DISCONTD: morphine  BP 108/71  Pulse 85  Temp(Src) 98.2 F (36.8 C) (Oral)  Resp 16  Ht 5\' 4"  (1.626 m)  Wt 71.668 kg (158 lb)  BMI 27.12 kg/m2  SpO2 99%   PPS: 30  Pain Score: unable to score  Intake/Output Summary (Last 24 hours) at 04/16/11 0930 Last data filed at 04/16/11 0500  Gross per 24 hour  Intake      0 ml  Output    201 ml  Net   -201 ml      YNW:GNFAO stool yesterday   Stool Softner: yes   Labs: CBC    Component Value Date/Time   WBC 11.3* 04/16/2011 0515   WBC 6.5 02/16/2011 1119   RBC 2.84* 04/16/2011 0515   RBC 3.47* 02/16/2011 1119   HGB 8.4* 04/16/2011 0515   HGB 10.9* 02/16/2011 1119   HCT 24.2* 04/16/2011 0515   HCT 32.7* 02/16/2011 1119   PLT 371 04/16/2011 0515   PLT 149 02/16/2011 1119   MCV 85.2 04/16/2011 0515   MCV 94.1 02/16/2011 1119   MCH 29.6 04/16/2011 0515   MCH 31.4 02/16/2011 1119   MCHC 34.7 04/16/2011 0515   MCHC 33.4  02/16/2011 1119   RDW 17.0* 04/16/2011 0515   RDW 15.2* 02/16/2011 1119   LYMPHSABS 0.3* 04/12/2011 0548   LYMPHSABS 0.3* 02/16/2011 1119   MONOABS 0.3 04/12/2011 0548   MONOABS 0.5 02/16/2011 1119   EOSABS 0.1 04/12/2011 0548   EOSABS 0.1 02/16/2011 1119   BASOSABS 0.0 04/12/2011 0548   BASOSABS 0.0 02/16/2011 1119    BMET    Component Value Date/Time   NA 140 04/16/2011 0515   K 4.4 04/16/2011 0515   CL 109 04/16/2011 0515   CO2 19 04/16/2011 0515   GLUCOSE 111* 04/16/2011 0515   BUN 38* 04/16/2011 0515   CREATININE 2.03* 04/16/2011 0515   CALCIUM 9.5 04/16/2011 0515   GFRNONAA 21* 04/16/2011 0515   GFRAA 25* 04/16/2011 0515    CMP     Component Value Date/Time   NA 140 04/16/2011 0515   K 4.4 04/16/2011 0515   CL 109 04/16/2011 0515   CO2 19 04/16/2011 0515   GLUCOSE 111* 04/16/2011 0515   BUN 38* 04/16/2011 0515   CREATININE 2.03* 04/16/2011 0515   CALCIUM 9.5 04/16/2011 0515   PROT 6.4 04/10/2011 0300   ALBUMIN 2.4* 04/10/2011 0300   AST 16 04/10/2011 0300   ALT 19 04/10/2011 0300   ALKPHOS 227* 04/10/2011 0300   BILITOT 0.8 04/10/2011 0300   GFRNONAA 21* 04/16/2011 0515   GFRAA 25* 04/16/2011 0515     Time In Time Out Total Time Spent with Patient Total Overall Time  845 1000 30 FTF 70 minutes    Greater than 50%  of this time was spent counseling and coordinating care related to the above assessment and plan as well as discharge coordination.     1

## 2011-04-16 NOTE — Progress Notes (Signed)
Patient transferred via [facility] to [facility].  VSS.  No change from morning assessment.  Report given to Hilda Lias, Charity fundraiser at [facility].

## 2011-04-16 NOTE — Progress Notes (Signed)
I am covering this patient today for Drs. Nicholes Mango.   Subjective: I was asked to comment on the radiologically placed biliary stent, whether it is patent or contributing to her infection.  Her LFTs are normal and there is air in biliary tree on recent CT and so I think the biliary probably IS indeed patent.  I could not find procedure note from 2011 failed ERCP attempt by Dr. Elnoria Howard to review it.   Objective: Vital signs in last 24 hours: Temp:  [97.8 F (36.6 C)-98.4 F (36.9 C)] 98.2 F (36.8 C) (02/02 0610) Pulse Rate:  [85-92] 85  (02/02 0610) Resp:  [16] 16  (02/02 0610) BP: (97-108)/(66-71) 108/71 mmHg (02/02 0610) SpO2:  [98 %-99 %] 98 % (02/02 1028) Last BM Date: 04/16/11 General: sleeping, arousable Heart: regular rate and rythm Abdomen: soft, non-tender, non-distended, normal bowel sounds   Lab Results:  Basename 04/16/11 0515 04/15/11 0548  WBC 11.3* 10.5  HGB 8.4* 8.1*  PLT 371 333  MCV 85.2 86.0   BMET  Basename 04/16/11 0515 04/15/11 0548 04/14/11 0500  NA 140 138 141  K 4.4 4.0 4.2  CL 109 110 110  CO2 19 21 18*  GLUCOSE 111* 105* 125*  BUN 38* 30* 24*  CREATININE 2.03* 1.78* 1.08  CALCIUM 9.5 8.9 9.1    Assessment/Plan: 78 y.o. female with pancreatic cancer  Family has requested transfer to [facility] and she has been accepted there.  They are awaiting bed.  Given normal LFTs and air in biliary tree I think the radiologically placed biliary stent is probably patent and is unlikely to be contributing to her abdominal infection.  Rob Bunting, MD  04/16/2011, 12:35 PM  Gastroenterology Pager [phone]

## 2011-04-18 LAB — BODY FLUID CULTURE: Culture: NO GROWTH

## 2011-04-25 ENCOUNTER — Other Ambulatory Visit: Payer: Medicare Other | Admitting: Lab

## 2011-04-25 ENCOUNTER — Ambulatory Visit: Payer: Medicare Other | Admitting: Oncology

## 2011-05-12 ENCOUNTER — Telehealth: Payer: Self-pay | Admitting: Pulmonary Disease

## 2011-05-12 NOTE — Telephone Encounter (Signed)
LMOMTCB x 1 

## 2011-05-12 NOTE — Telephone Encounter (Signed)
Pt's daughter called and wanted Dr Kriste Basque know that pt passed away on Sep 08, 2022, 06-02-11 at [facility] in [place] . She wanted to thank Dr Kriste Basque for everything he has done for pt.  Pt's daughter was very emotional.

## 2011-05-13 NOTE — Telephone Encounter (Signed)
SN is aware. 

## 2011-05-13 DEATH — deceased

## 2011-05-18 ENCOUNTER — Ambulatory Visit: Payer: Medicare Other | Admitting: Pulmonary Disease

## 2011-05-18 ENCOUNTER — Telehealth: Payer: Self-pay | Admitting: Pulmonary Disease

## 2011-05-18 NOTE — Telephone Encounter (Signed)
Pt's daughter returned call. °

## 2011-05-18 NOTE — Telephone Encounter (Signed)
I called, spoke with [place] Sink.  I informed her Shanda Bumps spoke with [facility] medical record who advised pt's daughter will have to go to [facility] and sign a next of kin release in order for them to release info to our facility.  She verbalized understanding of this.  Nothing further needed at this time.

## 2011-05-18 NOTE — Telephone Encounter (Signed)
Called spoke with [place] Sink who is requesting pt's records from [facility] be sent to [facility] for review and to more accessible for her to receive copies.  Pt is deceased per 05/20/11 phone message.  Called [facility] medical records @ [phone], spoke with Claris Che.  Per Claris Che, pt's daughter will have to go the [facility] and sign a next-of-kin release in order for [place] to release info to our facility.  LMOM TCB x1 for [name].

## 2011-05-24 ENCOUNTER — Telehealth: Payer: Self-pay | Admitting: Pulmonary Disease

## 2011-05-24 NOTE — Telephone Encounter (Signed)
Notes should be coming from [facility] and [facility]-placed request yesterday; Aware that I am sending this message to Leigh to be on the look out for this info.

## 2011-05-31 ENCOUNTER — Telehealth: Payer: Self-pay | Admitting: Pulmonary Disease

## 2011-05-31 NOTE — Telephone Encounter (Signed)
Pt's daughter, [place] Sink,  is asking to speak w/ both Leigh & the triage nurses.  Can be reached at [phone].  Antionette Fairy

## 2011-05-31 NOTE — Telephone Encounter (Signed)
Duplicate message. See phone note 05/24/11

## 2011-05-31 NOTE — Telephone Encounter (Signed)
LMOM for pt's daughter that records have still not been received on pt from [facility].

## 2011-05-31 NOTE — Telephone Encounter (Signed)
I spoke with [place] and she states she called [facility] today to have these re faxed this to our office. She also states that someone had asked her why didn't Dr. Kriste Basque readmit pt to the hospital in January when pt"was bleeding out pn coumadin". [place] Sink states she would also like to know this as well. She was not really clear on exactly what she was talking about. Please advise Dr. Kriste Basque, thanks

## 2011-06-01 NOTE — Telephone Encounter (Signed)
Spoke with pt. Daughter, Ms. Ernie Avena.  Explained to her in detail that she must have a signed release for medical records that specifies the records to be delivered directly to her.  Due to HIPAA stipulations, we cannot honor her request to have her records sent to Dr. Kriste Basque and then have Dr. Kriste Basque hand them over to her.  We are sensitive to the cost of obtaining copies of records, but we cannot circumvent policies in order to help her obtain copies for free.

## 2011-06-08 ENCOUNTER — Telehealth: Payer: Self-pay | Admitting: Pulmonary Disease

## 2011-06-08 NOTE — Telephone Encounter (Signed)
lmomtcb for pts daughter [place] Sink.

## 2011-06-13 NOTE — Telephone Encounter (Signed)
Called # provided above - [email or web address] 

## 2011-06-14 ENCOUNTER — Telehealth: Payer: Self-pay | Admitting: Pulmonary Disease

## 2011-06-14 NOTE — Telephone Encounter (Signed)
lmomtcb x1 

## 2011-06-14 NOTE — Telephone Encounter (Signed)
lmomtcb for [place] Sink advising if anything further is needed regarding this call to pls call back and leave new msg for triage nurse.  Per protocol, will sign off on msg as 3 msgs have been left.

## 2011-06-16 NOTE — Telephone Encounter (Signed)
LMOMTCB x 1 for [name].

## 2011-06-17 NOTE — Telephone Encounter (Signed)
Called, spoke with pt's daughter, [place] Sink.  She has two questions/concerns:  1.  States she received a call from the director who told her Dr. Kriste Basque said pt died from pancreatic cancer.  [place] Sink would like Dr. Kriste Basque to know "she did not die of pancreatic cancer.  No biopsy was done."  Per [place] Sink, they are saying pt died from pulmonary and cardiac arrest but she is still waiting on an autopsy.   2.  She would like to know why Dr. Kriste Basque did not readmit pt to the hospital.  States she was told by another dr that pt's PCP could have readmitted her.    Advised I would send msg to Dr. Kriste Basque and Budd Palmer and someone would contact her regarding her questions.  She verbalized understanding of this and voiced no further questions/concerns at this time.

## 2011-06-21 ENCOUNTER — Other Ambulatory Visit: Payer: Medicare Other | Admitting: Lab

## 2011-06-21 ENCOUNTER — Ambulatory Visit: Payer: Medicare Other | Admitting: Hematology and Oncology

## 2011-06-27 NOTE — Telephone Encounter (Signed)
Will forward to [facility] and Dr Kriste Basque as per last documentation.

## 2011-07-14 NOTE — Telephone Encounter (Signed)
Will sign off and print for [place].

## 2011-07-15 NOTE — Telephone Encounter (Signed)
Dr. Kriste Basque has previously communicated with  Sink about her mother.

## 2011-07-15 NOTE — Telephone Encounter (Signed)
Leigh, can you please ensure that this has been taken care of?  Thanks!

## 2012-08-15 IMAGING — CT CT CHEST W/ CM
5 of 9 series · 13 of 36 positions shown, 14 images · IV contrast (APPLIED)
Comparison: Prior CT of abdomen and pelvis 04/11/2011, and prior
chest CT 03/22/2011.

CLINICAL DATA: Altered mental status.  Evaluate for bronchitis.
Evaluate chest for metastases versus PE.

CT CHEST WITH CONTRAST
TECHNIQUE: Multidetector CT imaging of the chest was performed
following the standard protocol during bolus administration of
intravenous contrast.
Contrast: 80mL OMNIPAQUE IOHEXOL 300 MG/ML IV SOLN

[Series 6: rtn chest with st · axial · 0.65mm/px · z∈[-54,+90]mm · 4 of 49 slices shown, 5 images]
[im 10/49  mediastinal]
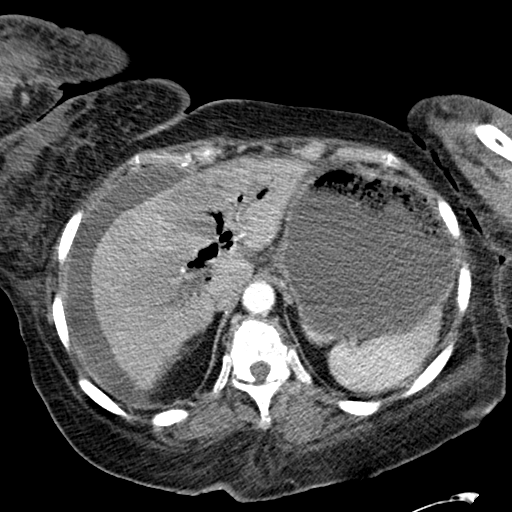
[im 10/49  lung]
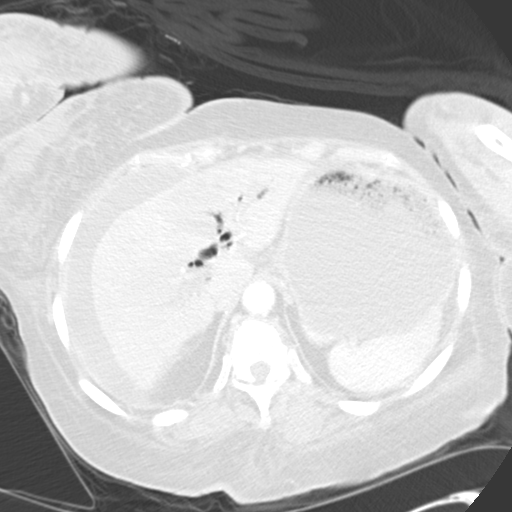
[im 20/49  lung]
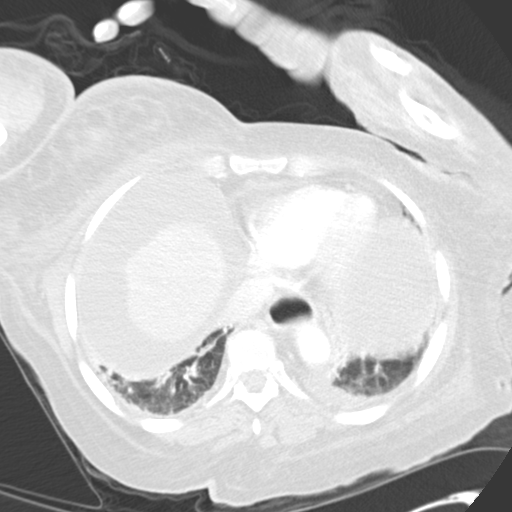
[im 29/49  lung]
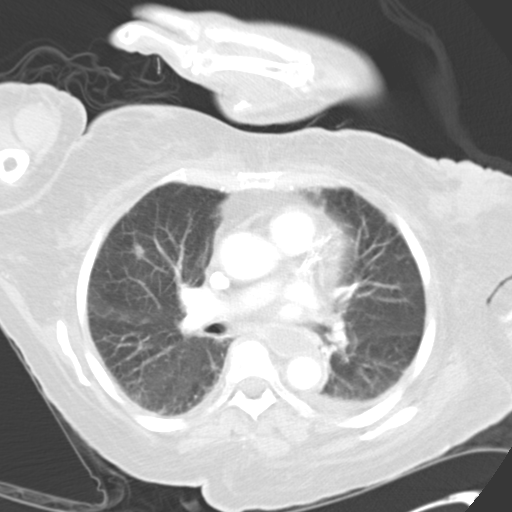
[im 39/49  lung]
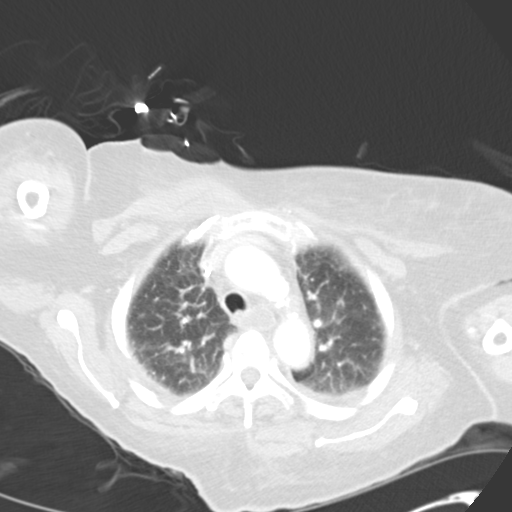

[Series 8: head_seq 4.5 h37s st · axial · 0.48mm/px · z∈[+161,+205]mm · 2 of 32 slices shown (1 of 3)]
[im 11/32  lung]
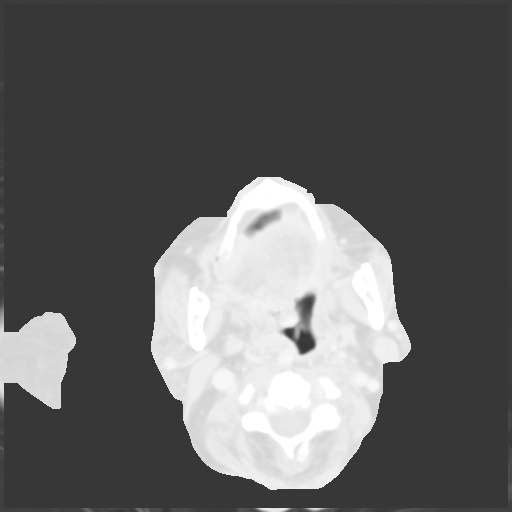
[im 21/32  lung]
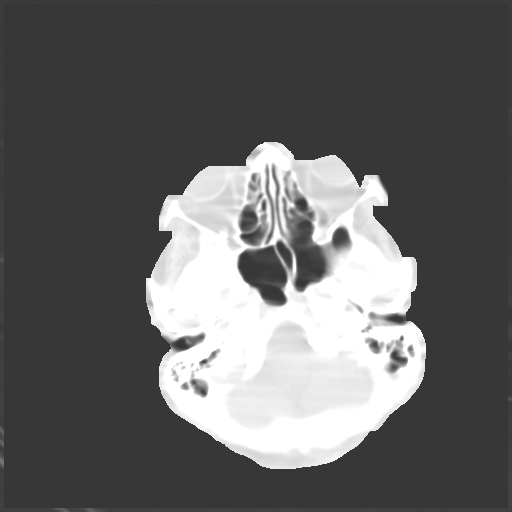

[Series 10: head_seq 4.5 h37s st · axial · 0.48mm/px · z∈[+239,+284]mm · 2 of 32 slices shown (2 of 3)]
[im 11/32  lung]
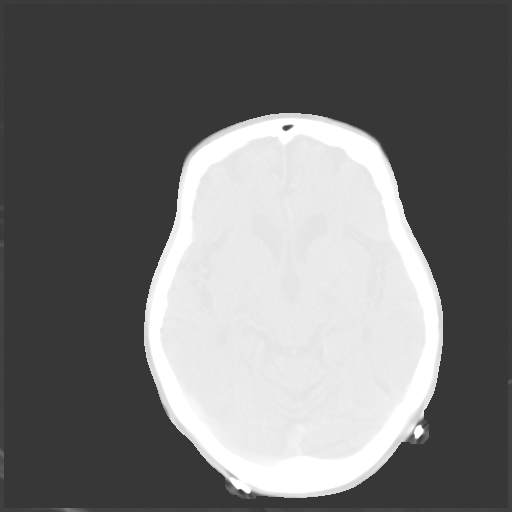
[im 21/32  lung]
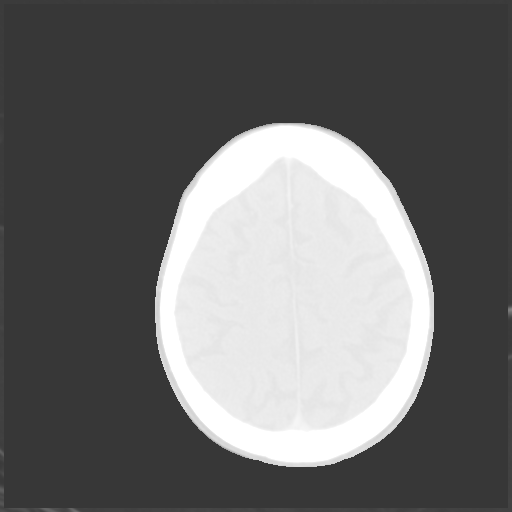

[Series 13: head_seq 4.5 h37s st · axial · 0.42mm/px · z∈[+246,+290]mm · 2 of 32 slices shown (3 of 3)]
[im 11/32  lung]
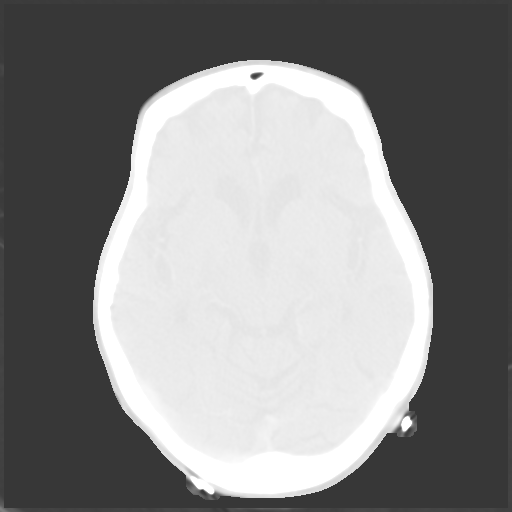
[im 21/32  lung]
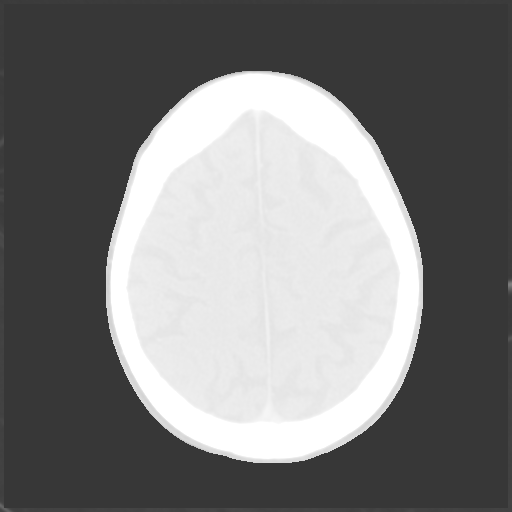

[Series 602: cor · coronal · 0.65mm/px · 3 of 67 slices shown]
[im 14/67  lung]
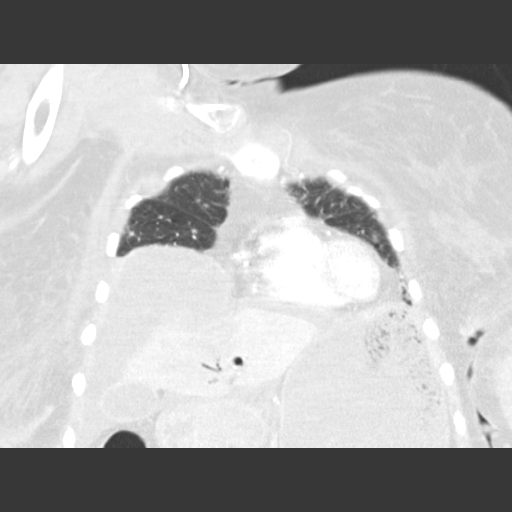
[im 27/67  lung]
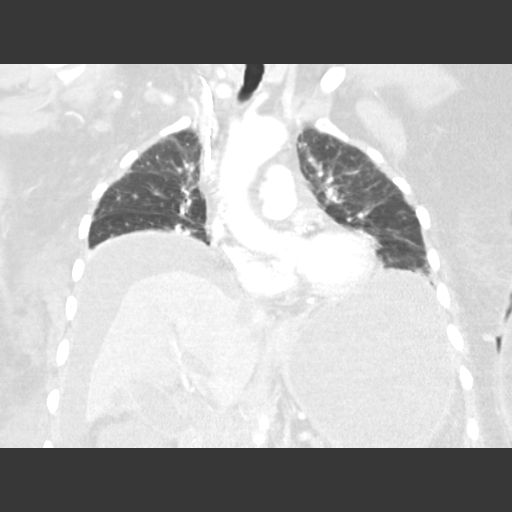
[im 40/67  lung]
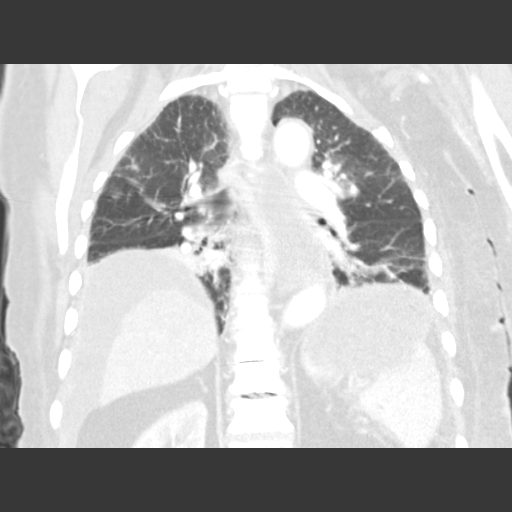

[13 of 36 positions shown; findings below may reference images not displayed]

FINDINGS: Mediastinum: Right internal jugular single lumen power Port-A-Cath
with tip terminating in the distal superior vena cava. Heart size
is normal. There is atherosclerosis of the thoracic aorta, the
great vessels of the mediastinum and the coronary arteries,
including calcified atherosclerotic plaque in the the left main,
LAD and right coronary arteries. No pathologically enlarged
mediastinal or hilar lymph nodes. Esophagus is patulous and fluid-
filled. There is no significant pericardial fluid, thickening or
pericardial calcification.    No central filling defects are noted
within the pulmonic trunk or main pulmonary arteries to suggest
large pulmonary embolism (please note that this was not a PE
protocol CT scan).

Lungs/Pleura: As with prior examinations are again several
pulmonary nodules scattered throughout the lungs bilaterally.  The
largest of these is in the posterior aspect of the left lower lobe
(image 22 of series 11) measuring 11 x 10 mm (very similar to the
prior).  An additional prominent 9 mm nodule in the lateral segment
of the right middle lobe (image 21 of series 11) is unchanged.
Small left pleural effusion and trace right-sided pleural effusion
are similar to prior.  There is a background of ground-glass
attenuation scattered throughout the lungs bilaterally with some
mild septal thickening, suggestive of mild pulmonary edema.  No
frank airspace consolidation at this time.  Areas of bronchial wall
thickening and mild cylindrical bronchiectasis are noted in the
lower lobes of the lungs bilaterally at the bases, likely reflects
scarring from prior infection.

Upper Abdomen: Visualized portions of the upper abdomen appear very
similar to recent CT of the abdomen and pelvis dated 04/11/2011
(please see that study for full description).

Musculoskeletal:  No aggressive appearing lytic or blastic lesions
are noted in the visualized portions of the skeleton. Chronic
anterior dislocation of the right sternoclavicular joint is again
noted.
IMPRESSION: 1.  This examination was performed as a chest CT with contrast.
While there is no large central pulmonary embolism, the examination
is insufficient to exclude smaller segmental and subsegmental sized
pulmonary emboli.
2.  Appearance of the lung parenchyma suggests mild pulmonary
edema.  No definitive findings at this time to suggest pneumonia or
bronchitis.
3.  Small left and trace right-sided pleural effusions are
unchanged.
4.  Multiple pulmonary nodules, as above, unchanged compared to
recent prior study.
5. Atherosclerosis, including left main and two-vessel coronary
artery disease. Please note that although the presence of coronary
artery calcium documents the presence of coronary artery disease,
the severity of this disease and any potential stenosis cannot be
assessed on this non-gated CT examination.
6.  Esophagus is patulous and fluid-filled, suggesting that the
patient is a increased risk for aspiration.  Appearance of the lung
bases suggests extensive scarring, likely from prior aspiration
events.

## 2013-10-21 NOTE — Telephone Encounter (Signed)
Phone call - encounter closed.
